# Patient Record
Sex: Female | Born: 1940 | Race: White | Hispanic: No | Smoking: Former smoker
Health system: Southern US, Community
[De-identification: ages and names within clinical notes are randomized; demographics above are authoritative.]

## PROBLEM LIST (undated history)

## (undated) DIAGNOSIS — Z8619 Personal history of other infectious and parasitic diseases: Secondary | ICD-10-CM

## (undated) DIAGNOSIS — R5383 Other fatigue: Secondary | ICD-10-CM

## (undated) DIAGNOSIS — E039 Hypothyroidism, unspecified: Secondary | ICD-10-CM

## (undated) DIAGNOSIS — M1711 Unilateral primary osteoarthritis, right knee: Secondary | ICD-10-CM

## (undated) DIAGNOSIS — C801 Malignant (primary) neoplasm, unspecified: Secondary | ICD-10-CM

## (undated) DIAGNOSIS — K632 Fistula of intestine: Secondary | ICD-10-CM

## (undated) DIAGNOSIS — R5381 Other malaise: Secondary | ICD-10-CM

## (undated) DIAGNOSIS — Z85850 Personal history of malignant neoplasm of thyroid: Secondary | ICD-10-CM

## (undated) DIAGNOSIS — N951 Menopausal and female climacteric states: Secondary | ICD-10-CM

## (undated) DIAGNOSIS — R55 Syncope and collapse: Secondary | ICD-10-CM

## (undated) DIAGNOSIS — K219 Gastro-esophageal reflux disease without esophagitis: Secondary | ICD-10-CM

## (undated) DIAGNOSIS — J309 Allergic rhinitis, unspecified: Secondary | ICD-10-CM

## (undated) DIAGNOSIS — R51 Headache: Secondary | ICD-10-CM

## (undated) DIAGNOSIS — M62830 Muscle spasm of back: Secondary | ICD-10-CM

## (undated) DIAGNOSIS — M810 Age-related osteoporosis without current pathological fracture: Principal | ICD-10-CM

## (undated) DIAGNOSIS — I10 Essential (primary) hypertension: Secondary | ICD-10-CM

## (undated) DIAGNOSIS — Z9109 Other allergy status, other than to drugs and biological substances: Secondary | ICD-10-CM

## (undated) DIAGNOSIS — E042 Nontoxic multinodular goiter: Secondary | ICD-10-CM

## (undated) DIAGNOSIS — R569 Unspecified convulsions: Secondary | ICD-10-CM

## (undated) DIAGNOSIS — Z8739 Personal history of other diseases of the musculoskeletal system and connective tissue: Secondary | ICD-10-CM

## (undated) HISTORY — PX: COLON SURGERY: SHX602

## (undated) HISTORY — DX: Hypothyroidism, unspecified: E03.9

## (undated) HISTORY — DX: Unilateral primary osteoarthritis, right knee: M17.11

## (undated) HISTORY — DX: Personal history of malignant neoplasm of thyroid: Z85.850

## (undated) HISTORY — DX: Other allergy status, other than to drugs and biological substances: Z91.09

## (undated) HISTORY — DX: Essential (primary) hypertension: I10

## (undated) HISTORY — DX: Unspecified convulsions: R56.9

## (undated) HISTORY — DX: Nontoxic multinodular goiter: E04.2

## (undated) HISTORY — DX: Age-related osteoporosis without current pathological fracture: M81.0

## (undated) HISTORY — DX: Other fatigue: R53.83

## (undated) HISTORY — PX: FRACTURE SURGERY: SHX138

## (undated) HISTORY — DX: Allergic rhinitis, unspecified: J30.9

## (undated) HISTORY — DX: Fistula of intestine: K63.2

## (undated) HISTORY — DX: Menopausal and female climacteric states: N95.1

## (undated) HISTORY — DX: Malignant (primary) neoplasm, unspecified: C80.1

## (undated) HISTORY — DX: Other malaise: R53.81

## (undated) HISTORY — DX: Syncope and collapse: R55

## (undated) HISTORY — DX: Personal history of other diseases of the musculoskeletal system and connective tissue: Z87.39

## (undated) HISTORY — PX: OTHER SURGICAL HISTORY: SHX169

---

## 1982-04-02 DIAGNOSIS — C801 Malignant (primary) neoplasm, unspecified: Secondary | ICD-10-CM

## 1982-04-02 HISTORY — DX: Malignant (primary) neoplasm, unspecified: C80.1

## 1983-04-03 HISTORY — PX: OTHER SURGICAL HISTORY: SHX169

## 1999-04-03 HISTORY — PX: OTHER SURGICAL HISTORY: SHX169

## 2000-01-30 ENCOUNTER — Ambulatory Visit (HOSPITAL_BASED_OUTPATIENT_CLINIC_OR_DEPARTMENT_OTHER): Admission: RE | Admit: 2000-01-30 | Discharge: 2000-01-30 | Payer: Self-pay | Admitting: Orthopaedic Surgery

## 2000-04-16 ENCOUNTER — Other Ambulatory Visit: Admission: RE | Admit: 2000-04-16 | Discharge: 2000-04-16 | Payer: Self-pay | Admitting: Family Medicine

## 2001-06-10 ENCOUNTER — Other Ambulatory Visit: Admission: RE | Admit: 2001-06-10 | Discharge: 2001-06-10 | Payer: Self-pay | Admitting: Family Medicine

## 2002-01-18 ENCOUNTER — Emergency Department (HOSPITAL_COMMUNITY): Admission: EM | Admit: 2002-01-18 | Discharge: 2002-01-18 | Payer: Self-pay | Admitting: Emergency Medicine

## 2002-01-18 ENCOUNTER — Encounter: Payer: Self-pay | Admitting: Orthopedic Surgery

## 2002-01-18 ENCOUNTER — Encounter: Payer: Self-pay | Admitting: Emergency Medicine

## 2002-01-28 ENCOUNTER — Ambulatory Visit (HOSPITAL_BASED_OUTPATIENT_CLINIC_OR_DEPARTMENT_OTHER): Admission: RE | Admit: 2002-01-28 | Discharge: 2002-01-28 | Payer: Self-pay | Admitting: Orthopedic Surgery

## 2002-10-06 ENCOUNTER — Other Ambulatory Visit: Admission: RE | Admit: 2002-10-06 | Discharge: 2002-10-06 | Payer: Self-pay | Admitting: Family Medicine

## 2003-12-02 HISTORY — PX: OTHER SURGICAL HISTORY: SHX169

## 2005-02-13 ENCOUNTER — Other Ambulatory Visit: Admission: RE | Admit: 2005-02-13 | Discharge: 2005-02-13 | Payer: Self-pay | Admitting: Family Medicine

## 2006-05-23 ENCOUNTER — Other Ambulatory Visit: Admission: RE | Admit: 2006-05-23 | Discharge: 2006-05-23 | Payer: Self-pay | Admitting: Family Medicine

## 2007-07-09 ENCOUNTER — Encounter: Payer: Self-pay | Admitting: Internal Medicine

## 2008-07-15 ENCOUNTER — Encounter: Payer: Self-pay | Admitting: Internal Medicine

## 2008-07-15 ENCOUNTER — Other Ambulatory Visit: Admission: RE | Admit: 2008-07-15 | Discharge: 2008-07-15 | Payer: Self-pay | Admitting: Family Medicine

## 2008-07-16 LAB — CONVERTED CEMR LAB: Pap Smear: NORMAL

## 2008-07-29 ENCOUNTER — Encounter: Payer: Self-pay | Admitting: Internal Medicine

## 2008-08-09 ENCOUNTER — Encounter: Payer: Self-pay | Admitting: Internal Medicine

## 2009-04-21 ENCOUNTER — Ambulatory Visit: Payer: Self-pay | Admitting: Internal Medicine

## 2009-04-21 ENCOUNTER — Telehealth (INDEPENDENT_AMBULATORY_CARE_PROVIDER_SITE_OTHER): Payer: Self-pay | Admitting: *Deleted

## 2009-04-21 DIAGNOSIS — R5383 Other fatigue: Secondary | ICD-10-CM

## 2009-04-21 DIAGNOSIS — E039 Hypothyroidism, unspecified: Secondary | ICD-10-CM

## 2009-04-21 DIAGNOSIS — J309 Allergic rhinitis, unspecified: Secondary | ICD-10-CM

## 2009-04-21 DIAGNOSIS — R569 Unspecified convulsions: Secondary | ICD-10-CM

## 2009-04-21 DIAGNOSIS — R5381 Other malaise: Secondary | ICD-10-CM

## 2009-04-21 DIAGNOSIS — I10 Essential (primary) hypertension: Secondary | ICD-10-CM

## 2009-04-21 DIAGNOSIS — E785 Hyperlipidemia, unspecified: Secondary | ICD-10-CM

## 2009-04-21 DIAGNOSIS — Z85850 Personal history of malignant neoplasm of thyroid: Secondary | ICD-10-CM

## 2009-04-21 HISTORY — DX: Personal history of malignant neoplasm of thyroid: Z85.850

## 2009-04-21 HISTORY — DX: Hypothyroidism, unspecified: E03.9

## 2009-04-21 HISTORY — DX: Allergic rhinitis, unspecified: J30.9

## 2009-04-21 HISTORY — DX: Essential (primary) hypertension: I10

## 2009-04-21 HISTORY — DX: Other malaise: R53.81

## 2009-04-22 ENCOUNTER — Ambulatory Visit: Payer: Self-pay | Admitting: Internal Medicine

## 2009-04-22 ENCOUNTER — Encounter (INDEPENDENT_AMBULATORY_CARE_PROVIDER_SITE_OTHER): Payer: Self-pay | Admitting: *Deleted

## 2009-04-22 LAB — CONVERTED CEMR LAB
Albumin: 4 g/dL (ref 3.5–5.2)
Alkaline Phosphatase: 104 units/L (ref 39–117)
Basophils Absolute: 0 10*3/uL (ref 0.0–0.1)
CO2: 31 meq/L (ref 19–32)
Chloride: 102 meq/L (ref 96–112)
Cholesterol: 193 mg/dL (ref 0–200)
Creatinine, Ser: 0.8 mg/dL (ref 0.4–1.2)
Eosinophils Relative: 1.4 % (ref 0.0–5.0)
HDL: 79.5 mg/dL (ref >39.00)
Hemoglobin: 13.8 g/dL (ref 12.0–15.0)
LDL Cholesterol: 106 mg/dL — ABNORMAL HIGH (ref 0–99)
Lymphocytes Relative: 19.7 % (ref 12.0–46.0)
Monocytes Relative: 8.1 % (ref 3.0–12.0)
Neutro Abs: 3.4 10*3/uL (ref 1.4–7.7)
Platelets: 240 10*3/uL (ref 150.0–400.0)
Potassium: 3.9 meq/L (ref 3.5–5.1)
RDW: 12.5 % (ref 11.5–14.6)
Sodium: 139 meq/L (ref 135–145)
Specific Gravity, Urine: 1.01 (ref 1.000–1.030)
TSH: 0.85 microintl units/mL (ref 0.35–5.50)
Total CHOL/HDL Ratio: 2
Total Protein: 6.4 g/dL (ref 6.0–8.3)
Triglycerides: 36 mg/dL (ref 0.0–149.0)
Urobilinogen, UA: 0.2 (ref 0.0–1.0)
WBC: 4.8 10*3/uL (ref 4.5–10.5)
pH: 7.5 (ref 5.0–8.0)

## 2009-04-27 ENCOUNTER — Encounter: Admission: RE | Admit: 2009-04-27 | Discharge: 2009-04-27 | Payer: Self-pay | Admitting: Internal Medicine

## 2009-05-04 ENCOUNTER — Ambulatory Visit: Payer: Self-pay | Admitting: Endocrinology

## 2009-05-04 DIAGNOSIS — E042 Nontoxic multinodular goiter: Secondary | ICD-10-CM

## 2009-05-04 HISTORY — DX: Nontoxic multinodular goiter: E04.2

## 2009-05-10 ENCOUNTER — Encounter (INDEPENDENT_AMBULATORY_CARE_PROVIDER_SITE_OTHER): Payer: Self-pay | Admitting: *Deleted

## 2009-05-11 ENCOUNTER — Ambulatory Visit: Payer: Self-pay | Admitting: Internal Medicine

## 2009-05-26 ENCOUNTER — Ambulatory Visit: Payer: Self-pay | Admitting: Internal Medicine

## 2009-06-01 ENCOUNTER — Ambulatory Visit: Payer: Self-pay | Admitting: Internal Medicine

## 2009-08-12 ENCOUNTER — Encounter: Payer: Self-pay | Admitting: Internal Medicine

## 2009-08-12 LAB — HM MAMMOGRAPHY: HM Mammogram: NORMAL

## 2009-10-27 ENCOUNTER — Telehealth: Payer: Self-pay | Admitting: Internal Medicine

## 2009-12-20 ENCOUNTER — Telehealth: Payer: Self-pay | Admitting: Internal Medicine

## 2009-12-25 ENCOUNTER — Ambulatory Visit: Payer: Self-pay | Admitting: Cardiology

## 2009-12-25 ENCOUNTER — Inpatient Hospital Stay (HOSPITAL_COMMUNITY): Admission: EM | Admit: 2009-12-25 | Discharge: 2009-12-27 | Payer: Self-pay | Admitting: Emergency Medicine

## 2009-12-27 ENCOUNTER — Encounter (INDEPENDENT_AMBULATORY_CARE_PROVIDER_SITE_OTHER): Payer: Self-pay | Admitting: Internal Medicine

## 2010-01-02 ENCOUNTER — Ambulatory Visit: Payer: Self-pay | Admitting: Internal Medicine

## 2010-01-02 DIAGNOSIS — R61 Generalized hyperhidrosis: Secondary | ICD-10-CM | POA: Insufficient documentation

## 2010-01-02 DIAGNOSIS — R55 Syncope and collapse: Secondary | ICD-10-CM

## 2010-01-02 DIAGNOSIS — N390 Urinary tract infection, site not specified: Secondary | ICD-10-CM

## 2010-01-02 HISTORY — DX: Syncope and collapse: R55

## 2010-01-05 ENCOUNTER — Encounter: Payer: Self-pay | Admitting: Internal Medicine

## 2010-01-05 DIAGNOSIS — R799 Abnormal finding of blood chemistry, unspecified: Secondary | ICD-10-CM

## 2010-01-06 LAB — CONVERTED CEMR LAB
Beta Globulin: 4.7 % (ref 4.7–7.2)
Gamma Globulin: 6.9 % — ABNORMAL LOW (ref 11.1–18.8)

## 2010-01-09 ENCOUNTER — Encounter: Payer: Self-pay | Admitting: Internal Medicine

## 2010-01-09 ENCOUNTER — Ambulatory Visit: Payer: Self-pay | Admitting: Internal Medicine

## 2010-01-09 LAB — CONVERTED CEMR LAB
IgG (Immunoglobin G), Serum: 509 mg/dL — ABNORMAL LOW (ref 694–1618)
Total Protein, Serum Electrophoresis: 6.3 g/dL (ref 6.0–8.3)

## 2010-01-13 ENCOUNTER — Telehealth: Payer: Self-pay | Admitting: Internal Medicine

## 2010-01-17 ENCOUNTER — Encounter: Payer: Self-pay | Admitting: Internal Medicine

## 2010-01-18 ENCOUNTER — Ambulatory Visit: Payer: Self-pay

## 2010-01-18 ENCOUNTER — Encounter: Payer: Self-pay | Admitting: Internal Medicine

## 2010-01-24 ENCOUNTER — Ambulatory Visit: Payer: Self-pay | Admitting: Internal Medicine

## 2010-05-02 NOTE — Miscellaneous (Signed)
Summary: Orders Update  Clinical Lists Changes  Orders: Added new Test order of Carotid Duplex (Carotid Duplex) - Signed 

## 2010-05-02 NOTE — Letter (Signed)
Summary: Previsit letter  [facility]  [address] [place], [place] [address]   Phone: [phone]  Fax: [phone]       04/22/2009 MRN: [id number]  Tammy Duke [address] [place], [place]  [address]  Dear [name],  Welcome to the Gastroenterology Division at [facility].    You are scheduled to see a nurse for your pre-procedure visit on 05-11-09 at 10:00a.m. on the 3rd floor at [facility], [address]  We ask that you try to arrive at our office 15 minutes prior to your appointment time to allow for check-in.  Your nurse visit will consist of discussing your medical and surgical history, your immediate family medical history, and your medications.    Please bring a complete list of all your medications or, if you prefer, bring the medication bottles and we will list them.  We will need to be aware of both prescribed and over the counter drugs.  We will need to know exact dosage information as well.  If you are on blood thinners (Coumadin, Plavix, Aggrenox, Ticlid, etc.) please call our office today/prior to your appointment, as we need to consult with your physician about holding your medication.   Please be prepared to read and sign documents such as consent forms, a financial agreement, and acknowledgement forms.  If necessary, and with your consent, a friend or relative is welcome to sit-in on the nurse visit with you.  Please bring your insurance card so that we may make a copy of it.  If your insurance requires a referral to see a specialist, please bring your referral form from your primary care physician.  No co-pay is required for this nurse visit.     If you cannot keep your appointment, please call [phone] to cancel or reschedule prior to your appointment date.  This allows [place] the opportunity to schedule an appointment for another patient in need of care.    Thank you for choosing [facility] for your medical  needs.  We appreciate the opportunity to care for you.  Please visit [place] at our website  to learn more about our practice.                     Sincerely.                                                                                                                   The Gastroenterology Division

## 2010-05-02 NOTE — Procedures (Signed)
Summary: Colonoscopy  Patient: [name] Note: All result statuses are Final unless otherwise noted.  Tests: (1) Colonoscopy (COL)   COL Colonoscopy           DONE     [facility]     [address]     [place], [place]  [address]           COLONOSCOPY PROCEDURE REPORT           PATIENT:  Tammy, Duke  MR#:  [id number]     BIRTHDATE:  Nov 20, 1940, 68 yrs. old  GENDER:  female           ENDOSCOPIST:  Hedwig Morton. Juanda Chance, MD     Referred by:  Oliver Barre, M.D.           PROCEDURE DATE:  05/26/2009     PROCEDURE:  Colonoscopy [address]     ASA CLASS:  Class II     INDICATIONS:  Routine Risk Screening           MEDICATIONS:   Versed 7 mg, Fentanyl 50 mcg           DESCRIPTION OF PROCEDURE:   After the risks benefits and     alternatives of the procedure were thoroughly explained, informed     consent was obtained.  Digital rectal exam was performed and     revealed no rectal masses.   The LB [id number] endoscope     was introduced through the anus and advanced to the cecum, which     was identified by both the appendix and ileocecal valve, without     limitations.  The quality of the prep was good, using MiraLax.     The instrument was then slowly withdrawn as the colon was fully     examined.     <<PROCEDUREIMAGES>>           FINDINGS:  Severe diverticulosis was found in the sigmoid colon     (see image1, image3, image7, and image8). marked diverticulosis,     narrow tortuous lumen, thick haustral folds, deep diverticuli,     difficult to traverse due to spasm  This was otherwise a normal     examination of the colon (see image9, image6, image5, and image4).     Retroflexed views in the rectum revealed no abnormalities.    The     scope was then withdrawn from the patient and the procedure     completed.           COMPLICATIONS:  None           ENDOSCOPIC IMPRESSION:     1) Severe diverticulosis in the sigmoid colon     2) Otherwise normal examination     RECOMMENDATIONS:     1) High fiber diet.     Metamucil 1 tablespoon daily           REPEAT EXAM:  In 10 year(s) for.           ______________________________     Hedwig Morton. Juanda Chance, MD           CC:           n.     eSIGNED:   Hedwig Morton. Ileah Falkenstein at 05/26/2009 11:15 AM           Marlana Latus, [phone]  Note: An exclamation mark (!) indicates a result that was not dispersed into the flowsheet. Document Creation Date: 05/26/2009 11:16 AM _______________________________________________________________________  (  1) Order result status: Final Collection or observation date-time: 05/26/2009 11:08 Requested date-time:  Receipt date-time:  Reported date-time:  Referring Physician:   Ordering Physician: Lina Sar [phone]) Specimen Source:  Source: Launa Grill Order Number: [id number] Lab site:   Appended Document: Colonoscopy    Clinical Lists Changes  Observations: Added new observation of COLONNXTDUE: 05/2019 (05/26/2009 11:21)

## 2010-05-02 NOTE — Progress Notes (Signed)
Summary: Rx refill req  Phone Note Refill Request Message from:  Patient on January 13, 2010 10:44 AM  Refills Requested: Medication #1:  CYCLOBENZAPRINE HCL 5 MG TABS 1 by mouth 3 times a day as needed   Dosage confirmed as above?Dosage Confirmed   Supply Requested: 3 months  Method Requested: Electronic Initial call taken by: [place] Pyle, CMA,  January 13, 2010 10:44 AM    Prescriptions: CYCLOBENZAPRINE HCL 5 MG TABS (CYCLOBENZAPRINE HCL) 1 by mouth 3 times a day as needed  #90 Each x 2   Entered by:   [place] Pyle, CMA   Authorized by:   Corwin Levins MD   Signed by:   [place] Pyle, CMA on 01/13/2010   Method used:   Electronically to        [facility] [phone]* (retail)       [address]       [place], [place]  [address]       Ph: [phone]       Fax: [phone]   RxID:   [id number]

## 2010-05-02 NOTE — Miscellaneous (Signed)
Summary: Orders Update   Clinical Lists Changes  Problems: Added new problem of OTHER NONSPECIFIC FINDINGS EXAMINATION OF BLOOD (ICD-790.99) Orders: Added new Test order of T-Immunofixation Electrophoresis, Serum  ([id number]) - Signed 

## 2010-05-02 NOTE — Progress Notes (Signed)
  Phone Note Call from Patient Call back at Home Phone [phone]   Caller: Patient Call For: Corwin Levins MD Summary of Call: Pt left msg on vm md ok carbamzeprine but he change to extended release. Pt is wanting to know why before she start taking med. Pls advise Initial call taken by: Orlan Leavens RMA,  December 20, 2009 4:35 PM  Follow-up for Phone Call        sorry , this was an error;  it is ok to take if she already has it, but I re-sent the original "instant" carbamazepine to the pharmacy Follow-up by: Corwin Levins MD,  December 20, 2009 5:32 PM  Additional Follow-up for Phone Call Additional follow up Details #1::        Pt informed Additional Follow-up by: [place] Pyle, CMA,  December 21, 2009 8:27 AM    New/Updated Medications: CARBAMAZEPINE 200 MG TABS (CARBAMAZEPINE) 5 by mouth once daily Prescriptions: CARBAMAZEPINE 200 MG TABS (CARBAMAZEPINE) 5 by mouth once daily  #150 x 11   Entered and Authorized by:   Corwin Levins MD   Signed by:   Corwin Levins MD on 12/20/2009   Method used:   Electronically to        [facility] [phone]* (retail)       [address]       [place], [place]  [address]       Ph: [phone]       Fax: [phone]   RxID:   [id number]

## 2010-05-02 NOTE — Assessment & Plan Note (Signed)
Summary: post ER /#/cd   Vital Signs:  Patient profile:   70 year old female Height:      70 inches Weight:      180.38 pounds BMI:     25.98 O2 Sat:      96 % on Room air Temp:     98.5 degrees F oral Pulse rate:   101 / minute BP sitting:   142 / 82  (left arm) Cuff size:   regular  Vitals Entered By: Zella Ball Ewing CMA Duncan Dull) (January 02, 2010 9:06 AM)  O2 Flow:  Room air  Preventive Care Screening  Mammogram:    Date:  08/12/2009    Results:  normal   CC: Post ER/RE   Primary Care Provider:  Corwin Levins MD  CC:  Post ER/RE.  History of Present Illness: Here to f/u - s/p recent obs eval at [facility] after episode of lighthededness and sliding off the chair to the floor with syncope;  no injury and no sz activity noted;  w/u in the hosp included monitoring, MI r/o'd, routine lab eval essentially neg except for mild anemia with hgb 11.6 (no overt bleed or bruise - has small bruises today after ASA and lovenox while hospd);  CT head neg for acute, 2d echo unrevealing, EEG neg;  also tx for  3 days while hospd only for ? UTI, pt currently denies further problems with GU such as dysuria, freq, urgency or hematuria.  Pt denies CP, worsening sob, doe, wheezing, orthopnea, pnd, worsening LE edema, palps, dizziness or further syncope.  Pt denies new neuro symptoms such as headache, facial or extremity weakness  Pt denies polydipsia, polyuria  Overall good compliance with meds, trying to follow low chol  diet, wt stable, little excercise however  Also on d/c summary mentions losartan at 25 mg;  also incidently today pt mentions a concern of her also of what she feels is profuse sweats at work during the summer (much less signficinat in the fall) but still perstent.    Problems Prior to Update: 1)  Goiter, Multinodular  (ICD-241.1) 2)  Preventive Health Care  (ICD-V70.0) 3)  Fatigue  (ICD-780.79) 4)  Thyroid Cancer, Hx of  (ICD-V10.87) 5)  Preventive Health Care   (ICD-V70.0) 6)  Seizure Disorder  (ICD-780.39) 7)  Hypertension  (ICD-401.9) 8)  Hyperlipidemia  (ICD-272.4) 9)  Allergic Rhinitis  (ICD-477.9) 10)  Hypothyroidism  (ICD-244.9)  Medications Prior to Update: 1)  Carbamazepine 200 Mg Tabs (Carbamazepine) .... 5 By Mouth Once Daily 2)  Meloxicam 15 Mg Tabs (Meloxicam) .Marland Kitchen.. 1 By Mouth Once Daily 3)  Cyclobenzaprine Hcl 5 Mg Tabs (Cyclobenzaprine Hcl) .Marland Kitchen.. 1 By Mouth 3 Times A Day As Needed 4)  Vitamin C 500 Mg Tabs (Ascorbic Acid) .... 1/2 By Mouth Once Daily 5)  Magnesium 250 Mg Tabs (Magnesium) .Marland Kitchen.. 1 By Mouth Once Daily 6)  Caltrate 600+d 600-400 Mg-Unit Tabs (Calcium Carbonate-Vitamin D) .Marland Kitchen.. 1 By Mouth Two Times A Day 7)  Calcium-Vitamin D 600-200 Mg-Unit Tabs (Calcium-Vitamin D) .Marland Kitchen.. 1 By Mouth Once Daily 8)  Losartan Potassium 50 Mg Tabs (Losartan Potassium) .Marland Kitchen.. 1po Once Daily 9)  Aspir-Low 81 Mg Tbec (Aspirin) .Marland Kitchen.. 1 By Mouth Once Daily 10)  Levothyroxine Sodium 125 Mcg Tabs (Levothyroxine Sodium) .Marland Kitchen.. 1 Qd  Current Medications (verified): 1)  Carbamazepine 200 Mg Tabs (Carbamazepine) .Marland Kitchen.. 1 and 1/2 Po Bid, and 1 By Mouth At Lunch and Dinner in Addition To Other Tegretol Dosing 2)  Meloxicam 15 Mg Tabs (Meloxicam) .Marland Kitchen.. 1 By Mouth Once Daily 3)  Cyclobenzaprine Hcl 5 Mg Tabs (Cyclobenzaprine Hcl) .Marland Kitchen.. 1 By Mouth 3 Times A Day As Needed 4)  Vitamin C 500 Mg Tabs (Ascorbic Acid) .... 1/2 By Mouth Once Daily 5)  Magnesium 250 Mg Tabs (Magnesium) .Marland Kitchen.. 1 By Mouth Once Daily 6)  Caltrate 600+d 600-400 Mg-Unit Tabs (Calcium Carbonate-Vitamin D) .Marland Kitchen.. 1 By Mouth Two Times A Day 7)  Calcium-Vitamin D 600-200 Mg-Unit Tabs (Calcium-Vitamin D) .Marland Kitchen.. 1 By Mouth Once Daily 8)  Losartan Potassium 50 Mg Tabs (Losartan Potassium) .Marland Kitchen.. 1po Once Daily 9)  Aspir-Low 81 Mg Tbec (Aspirin) .Marland Kitchen.. 1 By Mouth Once Daily 10)  Levothyroxine Sodium 125 Mcg Tabs (Levothyroxine Sodium) .Marland Kitchen.. 1 Qd  Allergies (verified): 1)  ! 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  , not ill appearing Head:  normocephalic and atraumatic.   Eyes:  vision grossly intact, pupils equal, and pupils round.   Ears:  R ear normal and L ear normal.   Nose:  no external deformity and no nasal discharge.   Mouth:  no gingival abnormalities and pharynx pink and moist.   Neck:  supple and no masses.   Lungs:  normal respiratory effort and normal breath sounds.   Heart:  normal rate and regular rhythm.   Abdomen:  soft, non-tender, and normal bowel sounds.   Msk:  no flank tender, no spine tender Extremities:  no edema, no erythema  Neurologic:  cranial nerves II-XII intact, strength normal in all extremities, and gait normal.   Skin:  color normal and no rashes.   Psych:  not depressed appearing and moderately anxious with pressure speech today   Impression & Recommendations:  Problem # 1:  SYNCOPE (ICD-780.2)  ? vasovagal vs other - etiology not clear, w/u neg to date; also for carotid dopplers, o/w follow  Orders: Radiology Referral (Radiology)  Problem # 2:  UTI (ICD-599.0) tx while hospd;  asympt now, declines further testing at this time  Problem # 3:  HYPERTENSION  (ICD-401.9)  Her updated medication list for this problem includes:    Losartan Potassium 50 Mg Tabs (Losartan potassium) .Marland Kitchen... 1po once daily  BP today: 142/82 Prior BP: 128/88 (06/01/2009)  Labs Reviewed: K+: 3.9 (04/22/2009) Creat: : 0.8 (04/22/2009)   Chol: 193 (04/22/2009)   HDL: 79.50 (04/22/2009)   LDL: 106 (04/22/2009)   TG: 36.0 (04/22/2009) OK to resume the 50 mg losartan - treat as above, f/u any worsening signs or symptoms   Problem # 4:  SWEATING (ICD-780.8)  not related to above, but a signficant concern of the pt todya - for SPEP  Orders: T-Serum Protein Electrophoresis ([id number])  Complete Medication List: 1)  Carbamazepine 200 Mg Tabs (Carbamazepine) .Marland KitchenMarland KitchenMarland Kitchen  1 and 1/2 po bid, and 1 by mouth at lunch and dinner in addition to other tegretol dosing 2)  Meloxicam 15 Mg Tabs (Meloxicam) .Marland Kitchen.. 1 by mouth once daily 3)  Cyclobenzaprine Hcl 5 Mg Tabs (Cyclobenzaprine hcl) .Marland Kitchen.. 1 by mouth 3 times a day as needed 4)  Vitamin C 500 Mg Tabs (Ascorbic acid) .... 1/2 by mouth once daily 5)  Magnesium 250 Mg Tabs (Magnesium) .Marland Kitchen.. 1 by mouth once daily 6)  Caltrate 600+d 600-400 Mg-unit Tabs (Calcium carbonate-vitamin d) .Marland Kitchen.. 1 by mouth two times a day 7)  Calcium-vitamin D 600-200 Mg-unit Tabs (Calcium-vitamin d) .Marland Kitchen.. 1 by mouth once daily 8)  Losartan Potassium 50 Mg Tabs (Losartan potassium) .Marland Kitchen.. 1po once daily 9)  Aspir-low 81 Mg Tbec (Aspirin) .Marland Kitchen.. 1 by mouth once daily 10)  Levothyroxine Sodium 125 Mcg Tabs (Levothyroxine sodium) .Marland Kitchen.. 1 qd  Patient Instructions: 1)  Please go to the Lab in the basement for your blood tests today 2)  You will be contacted about the referral(s) to: carotid doppler 3)  Please call the number on the [facility] Card for results of your testing  4)  Continue all previous medications as before this visit, including the losartan at 50 mg per day 5)  Please schedule a follow-up appointment in 3 months = Jan 2012 for your "yearly medicare exam"

## 2010-05-02 NOTE — Letter (Signed)
Summary: [facility] Instructions  [facility]  [address] [place], [place] [address]   Phone: [phone]  Fax: [phone]       Tammy Duke    [birth year]    MRN: [id number]       Procedure Day /Date:  05/26/09  Thursday     Arrival Time:  10:00am     Procedure Time: 11:00am     Location of Procedure:                    _ x_  [facility] (4th Floor)    PREPARATION FOR COLONOSCOPY WITH MIRALAX  Starting 5 days prior to your procedure _2/19/11 _ do not eat nuts, seeds, popcorn, corn, beans, peas,  salads, or any raw vegetables.  Do not take any fiber supplements (e.g. Metamucil, Citrucel, and Benefiber). ____________________________________________________________________________________________________   THE DAY BEFORE YOUR PROCEDURE         DATE: 05/25/09 DAY:  Wednesday  1   Drink clear liquids the entire day-NO SOLID FOOD  2   Do not drink anything colored red or purple.  Avoid juices with pulp.  No orange juice.  3   Drink at least 64 oz. (8 glasses) of fluid/clear liquids during the day to prevent dehydration and help the prep work efficiently.  CLEAR LIQUIDS INCLUDE: Water Jello Ice Popsicles Tea (sugar ok, no milk/cream) Powdered fruit flavored drinks Coffee (sugar ok, no milk/cream) Gatorade Juice: apple, white grape, white cranberry  Lemonade Clear bullion, consomm, broth Carbonated beverages (any kind) Strained chicken noodle soup Hard Candy  4   Mix the entire bottle of Miralax with 64 oz. of Gatorade/Powerade in the morning and put in the refrigerator to chill.  5   At 3:00 pm take 2 Dulcolax/Bisacodyl tablets.  6   At 4:30 pm take one Reglan/Metoclopramide tablet.  7  Starting at 5:00 pm drink one 8 oz glass of the Miralax mixture every 15-20 minutes until you have finished drinking the entire 64 oz.  You should finish drinking prep around 7:30 or 8:00 pm.  8   If you are nauseated, you may take the 2nd  Reglan/Metoclopramide tablet at 6:30 pm.        9    At 8:00 pm take 2 more DULCOLAX/Bisacodyl tablets.     THE DAY OF YOUR PROCEDURE      DATE:   05/26/09 DAY:  Thursday  You may drink clear liquids until  9:00am  (2 HOURS BEFORE PROCEDURE).   MEDICATION INSTRUCTIONS  Unless otherwise instructed, you should take regular prescription medications with a small sip of water as early as possible the morning of your procedure.    Additional medication instructions:  Be sure to take your regular prescription medicine the morning of procedure.         OTHER INSTRUCTIONS  You will need a responsible adult at least 69 years of age to accompany you and drive you home.   This person must remain in the waiting room during your procedure.  Wear loose fitting clothing that is easily removed.  Leave jewelry and other valuables at home.  However, you may wish to bring a book to read or an iPod/MP3 player to listen to music as you wait for your procedure to start.  Remove all body piercing jewelry and leave at home.  Total time from sign-in until discharge is approximately 2-3 hours.  You should go home directly after your procedure and rest.  You  can resume normal activities the day after your procedure.  The day of your procedure you should not:   Drive   Make legal decisions   Operate machinery   Drink alcohol   Return to work  You will receive specific instructions about eating, activities and medications before you leave.   The above instructions have been reviewed and explained to me by   Wyona Almas RN  May 11, 2009 10:26 AM     I fully understand and can verbalize these instructions _____________________________ Date _______

## 2010-05-02 NOTE — Miscellaneous (Signed)
Summary: [facility] Previsit/prep  Clinical Lists Changes  Medications: Added new medication of MIRALAX   POWD (POLYETHYLENE GLYCOL 3350) As per prep  instructions. - Signed Added new medication of METOCLOPRAMIDE HCL 10 MG  TABS (METOCLOPRAMIDE HCL) As per prep instructions. - Signed Added new medication of DULCOLAX 5 MG  TBEC (BISACODYL) Day before procedure take 2 at 3pm and 2 at 8pm. - Signed Rx of MIRALAX   POWD (POLYETHYLENE GLYCOL 3350) As per prep  instructions.;  #255gm x 0;  Signed;  Entered by: Wyona Almas RN;  Authorized by: Hart Carwin MD;  Method used: Electronically to [facility] [phone]*, [address], [place], [place], [place]  [address], Ph: [phone], Fax: [phone] Rx of METOCLOPRAMIDE HCL 10 MG  TABS (METOCLOPRAMIDE HCL) As per prep instructions.;  #2 x 0;  Signed;  Entered by: Wyona Almas RN;  Authorized by: Hart Carwin MD;  Method used: Electronically to [facility] [phone]*, [address], [place], [place], [place]  [address], Ph: [phone], Fax: [phone] Rx of DULCOLAX 5 MG  TBEC (BISACODYL) Day before procedure take 2 at 3pm and 2 at 8pm.;  #4 x 0;  Signed;  Entered by: Wyona Almas RN;  Authorized by: Hart Carwin MD;  Method used: Electronically to [facility] [phone]*, [address] [place], [place], [place]  [address], Ph: [phone], Fax: [phone] Observations: Added new observation of ALLERGY REV: Done (05/11/2009 9:24)    Prescriptions: DULCOLAX 5 MG  TBEC (BISACODYL) Day before procedure take 2 at 3pm and 2 at 8pm.  #4 x 0   Entered by:   Wyona Almas RN   Authorized by:   Hart Carwin MD   Signed by:   Wyona Almas RN on 05/11/2009   Method used:   Electronically to        [facility] [phone]* (retail)       [address]       [place], [place]  [address]       Ph: [phone]       Fax: [phone]   RxID:    [id number] METOCLOPRAMIDE HCL 10 MG  TABS (METOCLOPRAMIDE HCL) As per prep instructions.  #2 x 0   Entered by:   Wyona Almas RN   Authorized by:   Hart Carwin MD   Signed by:   Wyona Almas RN on 05/11/2009   Method used:   Electronically to        [facility] [phone]* (retail)       [address]       [place], [place]  [address]       Ph: [phone]       Fax: [phone]   RxID:   [id number] MIRALAX   POWD (POLYETHYLENE GLYCOL 3350) As per prep  instructions.  #255gm x 0   Entered by:   Wyona Almas RN   Authorized by:   Hart Carwin MD   Signed by:   Wyona Almas RN on 05/11/2009   Method used:   Electronically to        [facility] [phone]* (retail)       [address]       [place], [place]  [address]       Ph:  [phone]       Fax: [phone]   RxID:   [id number]

## 2010-05-02 NOTE — Progress Notes (Signed)
  Phone Note Refill Request Message from:  Fax from Pharmacy on October 27, 2009 3:27 PM  Refills Requested: Medication #1:  MELOXICAM 15 MG TABS 1 by mouth once daily   Dosage confirmed as above?Dosage Confirmed   Notes: [employer] Initial call taken by: Zella Ball Ewing CMA Duncan Dull),  October 27, 2009 3:27 PM    Prescriptions: MELOXICAM 15 MG TABS (MELOXICAM) 1 by mouth once daily  #30 x 4   Entered by:   Zella Ball Ewing CMA (AAMA)   Authorized by:   Corwin Levins MD   Signed by:   Scharlene Gloss CMA (AAMA) on 10/27/2009   Method used:   Faxed to ...       [facility] [phone]* (retail)       [address]       [place], [place]  [address]       Ph: [phone]       Fax: [phone]   RxID:   [id number]

## 2010-05-02 NOTE — Assessment & Plan Note (Signed)
Summary: 1 month follow up-lb    Vital Signs:  Patient profile:   70 year old female Height:      69 inches Weight:      182 pounds BMI:     26.97 O2 Sat:      97 % on Room air Temp:     97.8 degrees F oral Pulse rate:   93 / minute BP sitting:   128 / 88  (left arm) Cuff size:   regular  Vitals Entered ByMarland Kitchen Zella Ball Ewing (June 01, 2009 8:59 AM)  O2 Flow:  Room air  CC: 1 month followup/RE   Primary Care Provider:  Corwin Levins MD  CC:  1 month followup/RE.  History of Present Illness: overall doing well, no complaints,  good med tolerance, good compliance,  Pt denies CP, sob, doe, wheezing, orthopnea, pnd, worsening LE edema, palps, dizziness or syncope   Pt denies new neuro symptoms such as headache, facial or extremity weakness   No siezure activity.  Denies other hyper or hypo thyroid symtpoms such as wt change, voice change, skin change.  Trying to follow lower chol diet.    Problems Prior to Update: 1)  Goiter, Multinodular  (ICD-241.1) 2)  Preventive Health Care  (ICD-V70.0) 3)  Fatigue  (ICD-780.79) 4)  Thyroid Cancer, Hx of  (ICD-V10.87) 5)  Preventive Health Care  (ICD-V70.0) 6)  Seizure Disorder  (ICD-780.39) 7)  Hypertension  (ICD-401.9) 8)  Hyperlipidemia  (ICD-272.4) 9)  Allergic Rhinitis  (ICD-477.9) 10)  Hypothyroidism  (ICD-244.9)  Medications Prior to Update: 1)  Carbamazepine 200 Mg Xr12h-Tab (Carbamazepine) .... 5 By Mouth Once Daily 2)  Meloxicam 15 Mg Tabs (Meloxicam) .Marland Kitchen.. 1 By Mouth Once Daily 3)  Cyclobenzaprine Hcl 5 Mg Tabs (Cyclobenzaprine Hcl) .Marland Kitchen.. 1 By Mouth 3 Times A Day As Needed 4)  Vitamin C 500 Mg Tabs (Ascorbic Acid) .... 1/2 By Mouth Once Daily 5)  Magnesium 250 Mg Tabs (Magnesium) .Marland Kitchen.. 1 By Mouth Once Daily 6)  Caltrate 600+d 600-400 Mg-Unit Tabs (Calcium Carbonate-Vitamin D) .Marland Kitchen.. 1 By Mouth Two Times A Day 7)  Calcium-Vitamin D 600-200 Mg-Unit Tabs (Calcium-Vitamin D) .Marland Kitchen.. 1 By Mouth Once Daily 8)  Losartan Potassium 50 Mg Tabs  (Losartan Potassium) .Marland Kitchen.. 1po Once Daily 9)  Aspir-Low 81 Mg Tbec (Aspirin) .Marland Kitchen.. 1 By Mouth Once Daily 10)  Levothyroxine Sodium 125 Mcg Tabs (Levothyroxine Sodium) .Marland Kitchen.. 1 Qd 11)  Miralax   Powd (Polyethylene Glycol 3350) .... As Per Prep  Instructions. 12)  Metoclopramide Hcl 10 Mg  Tabs (Metoclopramide Hcl) .... As Per Prep Instructions. 13)  Dulcolax 5 Mg  Tbec (Bisacodyl) .... Day Before Procedure Take 2 At 3pm and 2 At 8pm.  Current Medications (verified): 1)  Carbamazepine 200 Mg Xr12h-Tab (Carbamazepine) .... 5 By Mouth Once Daily 2)  Meloxicam 15 Mg Tabs (Meloxicam) .Marland Kitchen.. 1 By Mouth Once Daily 3)  Cyclobenzaprine Hcl 5 Mg Tabs (Cyclobenzaprine Hcl) .Marland Kitchen.. 1 By Mouth 3 Times A Day As Needed 4)  Vitamin C 500 Mg Tabs (Ascorbic Acid) .... 1/2 By Mouth Once Daily 5)  Magnesium 250 Mg Tabs (Magnesium) .Marland Kitchen.. 1 By Mouth Once Daily 6)  Caltrate 600+d 600-400 Mg-Unit Tabs (Calcium Carbonate-Vitamin D) .Marland Kitchen.. 1 By Mouth Two Times A Day 7)  Calcium-Vitamin D 600-200 Mg-Unit Tabs (Calcium-Vitamin D) .Marland Kitchen.. 1 By Mouth Once Daily 8)  Losartan Potassium 50 Mg Tabs (Losartan Potassium) .Marland Kitchen.. 1po Once Daily 9)  Aspir-Low 81 Mg Tbec (Aspirin) .Marland Kitchen.. 1 By Mouth Once Daily  10)  Levothyroxine Sodium 125 Mcg Tabs (Levothyroxine Sodium) .Marland Kitchen.. 1 Qd 11)  Miralax   Powd (Polyethylene Glycol 3350) .... As Per Prep  Instructions. 12)  Metoclopramide Hcl 10 Mg  Tabs (Metoclopramide Hcl) .... As Per Prep Instructions. 13)  Dulcolax 5 Mg  Tbec (Bisacodyl) .... Day Before Procedure Take 2 At 3pm and 2 At 8pm.  Allergies (verified): 1)  ! 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   Head:  normocephalic and atraumatic.   Eyes:  vision grossly intact, pupils equal, and pupils round.   Ears:  R ear normal and L ear normal.   Nose:  no external deformity and no nasal discharge.   Mouth:  no gingival abnormalities and pharynx pink and moist.   Neck:  supple and no masses.   Lungs:  normal respiratory effort and normal breath sounds.   Heart:  normal rate and regular rhythm.   Extremities:  no edema, no erythema    Impression & Recommendations:  Problem # 1:  HYPERTENSION (ICD-401.9)  Her updated medication list for this problem includes:    Losartan Potassium 50 Mg Tabs (Losartan potassium) .Marland Kitchen... 1po once daily  BP today: 128/88 Prior BP: 158/92 (05/04/2009)  Labs Reviewed: K+: 3.9 (04/22/2009) Creat: : 0.8 (04/22/2009)   Chol: 193 (04/22/2009)   HDL: 79.50 (04/22/2009)   LDL: 106 (04/22/2009)   TG: 36.0 (04/22/2009) improved, Continue all previous medications as before this visit   Problem # 2:  HYPERLIPIDEMIA (ICD-272.4)  Labs Reviewed: SGOT: 24 (04/22/2009)   SGPT: 26 (04/22/2009)   HDL:79.50 (04/22/2009)  LDL:106 (04/22/2009)  Chol:193 (04/22/2009)  Trig:36.0 (04/22/2009)  d/w pt , Pt to continue diet efforts, labs reviewed - goal LDL less than 100  Problem # 3:  HYPOTHYROIDISM (ICD-244.9)  Her updated medication list for this problem includes:    Levothyroxine Sodium 125 Mcg Tabs (Levothyroxine sodium) .Marland Kitchen... 1 qd  Labs Reviewed: TSH: 0.85 (04/22/2009)    Chol: 193 (04/22/2009)   HDL: 79.50  (04/22/2009)   LDL: 106 (04/22/2009)   TG: 36.0 (04/22/2009) stable overall by hx and exam, ok to continue meds/tx as is   Complete Medication List: 1)  Carbamazepine 200 Mg Xr12h-tab (Carbamazepine) .... 5 by mouth once daily 2)  Meloxicam 15 Mg Tabs (Meloxicam) .Marland Kitchen.. 1 by mouth once daily 3)  Cyclobenzaprine Hcl 5 Mg Tabs (Cyclobenzaprine hcl) .Marland Kitchen.. 1 by mouth 3 times a day as needed 4)  Vitamin C 500 Mg Tabs (Ascorbic acid) .... 1/2 by mouth once daily 5)  Magnesium 250 Mg Tabs (Magnesium) .Marland Kitchen.. 1 by mouth once daily 6)  Caltrate 600+d 600-400 Mg-unit Tabs (Calcium carbonate-vitamin d) .Marland Kitchen.. 1 by mouth two times a day 7)  Calcium-vitamin D 600-200 Mg-unit Tabs (Calcium-vitamin d) .Marland Kitchen.. 1 by  mouth once daily 8)  Losartan Potassium 50 Mg Tabs (Losartan potassium) .Marland Kitchen.. 1po once daily 9)  Aspir-low 81 Mg Tbec (Aspirin) .Marland Kitchen.. 1 by mouth once daily 10)  Levothyroxine Sodium 125 Mcg Tabs (Levothyroxine sodium) .Marland Kitchen.. 1 qd 11)  Miralax Powd (Polyethylene glycol 3350) .... As per prep  instructions. 12)  Metoclopramide Hcl 10 Mg Tabs (Metoclopramide hcl) .... As per prep instructions. 13)  Dulcolax 5 Mg Tbec (Bisacodyl) .... Day before procedure take 2 at 3pm and 2 at 8pm.  Patient Instructions: 1)  please have your pap smear and mammogram in may 2011 (consider [facility]) 2)  Continue all previous medications as before this visit  3)  Please schedule a follow-up appointment in 1 year or sooner if needed

## 2010-05-02 NOTE — Letter (Signed)
Summary: Dr. Bradd Canary, M.D.  Dr. Bradd Canary, M.D.   Imported By: Sherian Rein 04/22/2009 07:18:59  _____________________________________________________________________  External Attachment:    Type:   Image     Comment:   External Document

## 2010-05-02 NOTE — Assessment & Plan Note (Signed)
Summary: FU ON TESTS/NWS  #   Vital Signs:  Patient profile:   70 year old female Height:      70 inches Weight:      175.13 pounds BMI:     25.22 O2 Sat:      96 % on Room air Temp:     98.9 degrees F oral Pulse rate:   87 / minute BP sitting:   132 / 80  (left arm) Cuff size:   regular  Vitals Entered By: Zella Ball Ewing CMA Duncan Dull) (January 24, 2010 10:04 AM)  O2 Flow:  Room air CC: Followup/RE   Primary Care Provider:  Corwin Levins MD  CC:  Followup/RE.  History of Present Illness: here to f/u; planning to travel to sister's in [place] and wants to know if she's ok to do so; needs new rx for flexeril as the pharmacy states they did not get refill;  Pt denies CP, worsening sob, doe, wheezing, orthopnea, pnd, worsening LE edema, palps, dizziness or syncope  Pt denies new neuro symptoms such as headache, facial or extremity weakness  Pt denies polydipsia, polyuria.  No fever, wt loss, night sweats, loss of appetite or other constitutional symptoms . Recent syncope w/u essentially neg, no further sympotms or sign or siezure.  No GU symptoms such as dysuria, freq, urgency or blood.  No overt bleeding or bruising.  Problems Prior to Update: 1)  Other Nonspecific Findings Examination of Blood  (ICD-790.99) 2)  Sweating  (ICD-780.8) 3)  Uti  (ICD-599.0) 4)  Syncope  (ICD-780.2) 5)  Goiter, Multinodular  (ICD-241.1) 6)  Preventive Health Care  (ICD-V70.0) 7)  Fatigue  (ICD-780.79) 8)  Thyroid Cancer, Hx of  (ICD-V10.87) 9)  Preventive Health Care  (ICD-V70.0) 10)  Seizure Disorder  (ICD-780.39) 11)  Hypertension  (ICD-401.9) 12)  Hyperlipidemia  (ICD-272.4) 13)  Allergic Rhinitis  (ICD-477.9) 14)  Hypothyroidism  (ICD-244.9)  Medications Prior to Update: 1)  Carbamazepine 200 Mg Tabs (Carbamazepine) .Marland Kitchen.. 1 and 1/2 Po Bid, and 1 By Mouth At Lunch and Dinner in Addition To Other Tegretol Dosing 2)  Meloxicam 15 Mg Tabs (Meloxicam) .Marland Kitchen.. 1 By Mouth Once Daily 3)  Cyclobenzaprine  Hcl 5 Mg Tabs (Cyclobenzaprine Hcl) .Marland Kitchen.. 1 By Mouth 3 Times A Day As Needed 4)  Vitamin C 500 Mg Tabs (Ascorbic Acid) .... 1/2 By Mouth Once Daily 5)  Magnesium 250 Mg Tabs (Magnesium) .Marland Kitchen.. 1 By Mouth Once Daily 6)  Caltrate 600+d 600-400 Mg-Unit Tabs (Calcium Carbonate-Vitamin D) .Marland Kitchen.. 1 By Mouth Two Times A Day 7)  Calcium-Vitamin D 600-200 Mg-Unit Tabs (Calcium-Vitamin D) .Marland Kitchen.. 1 By Mouth Once Daily 8)  Losartan Potassium 50 Mg Tabs (Losartan Potassium) .Marland Kitchen.. 1po Once Daily 9)  Aspir-Low 81 Mg Tbec (Aspirin) .Marland Kitchen.. 1 By Mouth Once Daily 10)  Levothyroxine Sodium 125 Mcg Tabs (Levothyroxine Sodium) .Marland Kitchen.. 1 Qd  Current Medications (verified): 1)  Carbamazepine 200 Mg Tabs (Carbamazepine) .Marland Kitchen.. 1 and 1/2 Po Bid, and 1 By Mouth At Lunch and Dinner in Addition To Other Tegretol Dosing 2)  Meloxicam 15 Mg Tabs (Meloxicam) .Marland Kitchen.. 1 By Mouth Once Daily 3)  Cyclobenzaprine Hcl 5 Mg Tabs (Cyclobenzaprine Hcl) .Marland Kitchen.. 1 By Mouth 3 Times A Day As Needed 4)  Vitamin C 500 Mg Tabs (Ascorbic Acid) .... 1/2 By Mouth Once Daily 5)  Magnesium 250 Mg Tabs (Magnesium) .Marland Kitchen.. 1 By Mouth Once Daily 6)  Caltrate 600+d 600-400 Mg-Unit Tabs (Calcium Carbonate-Vitamin D) .Marland Kitchen.. 1 By Mouth Two Times A  Day 7)  Calcium-Vitamin D 600-200 Mg-Unit Tabs (Calcium-Vitamin D) .Marland Kitchen.. 1 By Mouth Once Daily 8)  Losartan Potassium 50 Mg Tabs (Losartan Potassium) .Marland Kitchen.. 1po Once Daily 9)  Aspir-Low 81 Mg Tbec (Aspirin) .Marland Kitchen.. 1 By Mouth Once Daily 10)  Levothyroxine Sodium 125 Mcg Tabs (Levothyroxine Sodium) .Marland Kitchen.. 1 Qd  Allergies (verified): 1)  ! 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  , not ill appearing Head:  normocephalic and atraumatic.   Eyes:  vision grossly intact, pupils equal, and pupils round.   Ears:  R ear normal and L ear normal.   Nose:  no external deformity and no nasal discharge.   Mouth:  no gingival abnormalities and pharynx pink and moist.   Neck:  supple and no masses.   Lungs:  normal respiratory effort and normal breath sounds.   Heart:  normal rate and regular rhythm.   Abdomen:  soft, non-tender, and normal bowel sounds.   Msk:  no bilat flank tender Extremities:  no edema, no erythema  Neurologic:  cranial nerves II-XII intact, strength normal in all extremities, and gait normal.     Impression & Recommendations:  Problem # 1:  SYNCOPE (ICD-780.2) recent episode, w/u essentially neg, Ok for trip as she has planned, Continue all previous medications as before this visit   Problem # 2:  HYPERTENSION (ICD-401.9)  Her updated medication list for this problem includes:    Losartan Potassium 50 Mg Tabs (Losartan potassium) .Marland Kitchen... 1po once daily  BP today: 132/80 Prior BP: 142/82 (01/02/2010)  Labs Reviewed: K+: 3.9 (04/22/2009) Creat: : 0.8 (04/22/2009)   Chol: 193 (04/22/2009)   HDL: 79.50 (04/22/2009)   LDL: 106 (04/22/2009)   TG: 36.0 (04/22/2009) stable overall by hx and exam, ok to continue meds/tx as is   Problem # 3:  HYPERLIPIDEMIA (ICD-272.4)  Labs Reviewed: SGOT: 24 (04/22/2009)   SGPT: 26 (04/22/2009)   HDL:79.50 (04/22/2009)  LDL:106 (04/22/2009)  Chol:193 (04/22/2009)  Trig:36.0  (04/22/2009) d/w pt ; declines meds - Pt to continue diet efforts,  - goal LDL less than 100   Problem # 4:  UTI (ICD-599.0) resolved, no evidence of recurrence, ok to follow for now, pt reassured  Complete Medication List: 1)  Carbamazepine 200 Mg Tabs (Carbamazepine) .Marland Kitchen.. 1 and 1/2 po bid, and 1 by mouth at lunch and dinner in addition to other tegretol dosing 2)  Meloxicam 15 Mg Tabs (Meloxicam) .Marland Kitchen.. 1 by mouth once daily 3)  Cyclobenzaprine Hcl 5 Mg Tabs (Cyclobenzaprine hcl) .Marland Kitchen.. 1 by mouth 3 times a day as needed 4)  Vitamin C 500 Mg Tabs (Ascorbic acid) .... 1/2 by mouth once daily 5)  Magnesium 250 Mg Tabs (Magnesium) .Marland Kitchen.. 1 by mouth once daily 6)  Caltrate 600+d 600-400  Mg-unit Tabs (Calcium carbonate-vitamin d) .Marland Kitchen.. 1 by mouth two times a day 7)  Calcium-vitamin D 600-200 Mg-unit Tabs (Calcium-vitamin d) .Marland Kitchen.. 1 by mouth once daily 8)  Losartan Potassium 50 Mg Tabs (Losartan potassium) .Marland Kitchen.. 1po once daily 9)  Aspir-low 81 Mg Tbec (Aspirin) .Marland Kitchen.. 1 by mouth once daily 10)  Levothyroxine Sodium 125 Mcg Tabs (Levothyroxine sodium) .Marland Kitchen.. 1 qd  Patient Instructions: 1)  Continue all previous medications as before this visit  2)  You are OK to travel as you have planned 3)  Please schedule a follow-up appointment in 4 months with CPX labs Prescriptions: CYCLOBENZAPRINE HCL 5 MG TABS (CYCLOBENZAPRINE HCL) 1 by mouth 3 times a day as needed  #90 x 3   Entered and Authorized by:   Corwin Levins MD   Signed by:   Corwin Levins MD on 01/24/2010   Method used:   Print then Give to Patient   RxID:   [id number]    Orders Added: 1)  Est. Patient Level IV [96295]

## 2010-05-02 NOTE — Progress Notes (Signed)
----   Converted from flag ---- ---- 04/21/2009 10:45 AM, Dagoberto Reef wrote: Pt need appt with Dr Everardo All.   Thanks  ---- 04/21/2009 10:34 AM, Corwin Levins MD wrote: The following orders have been entered for this patient and placed on Admin Hold:  Type:     Referral       Code:   Radiology Description:   Radiology Referral Order Date:   04/21/2009   Authorized By:   Corwin Levins MD Order #:   [id number] Clinical Notes:   Name of Test or Procedure: thyroid u/s  Of What: Gave pt 05/04/09 @ 930A w/Dr Allie Dimmer Special Instructions ------------------------------

## 2010-05-02 NOTE — Assessment & Plan Note (Signed)
Summary: NEW MEDICARE ENDO PT--PER FLAG/MP-THY CA HX--STC   Vital Signs:  Patient profile:   70 year old female Height:      69.5 inches (176.53 cm) Weight:      184.50 pounds (83.86 kg) O2 Sat:      98 % on Room air Temp:     98.1 degrees F (36.72 degrees C) oral Pulse rate:   93 / minute BP sitting:   158 / 92  (left arm) Cuff size:   large  Vitals Entered By: Josph Macho CMA (May 04, 2009 9:21 AM)  O2 Flow:  Room air CC: New Endo: Thyroid/ CF Is Patient Diabetic? No   Referring Provider:  Corwin Levins MD  Primary Provider:  Corwin Levins MD  CC:  New Endo: Thyroid/ CF.  History of Present Illness: pt was noted in approx 1985 to have a goiter.  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  she was given the option of thyroid hormone suppression instead of surgery, and she has been on it ever since. symptomatically, she states 25 years of intermittent mild palpitations in the chest, but no associated numbness.  Current Medications (verified): 1)  Synthroid 125 Mcg Tabs (Levothyroxine Sodium) .Marland Kitchen.. 1 By Mouth Once Daily 2)  Carbamazepine 200 Mg Xr12h-Tab (Carbamazepine) .... 5 By Mouth Once Daily 3)  Meloxicam 15 Mg Tabs (Meloxicam) .Marland Kitchen.. 1 By Mouth Once Daily 4)  Cyclobenzaprine Hcl 5 Mg Tabs (Cyclobenzaprine Hcl) .Marland Kitchen.. 1 By Mouth 3 Times A Day As Needed 5)  Vitamin C 500 Mg Tabs (Ascorbic Acid) .... 1/2 By Mouth Once Daily 6)  Magnesium 250 Mg Tabs (Magnesium) .Marland Kitchen.. 1 By Mouth Once Daily 7)  Caltrate 600+d 600-400 Mg-Unit Tabs (Calcium Carbonate-Vitamin D) .Marland Kitchen.. 1 By Mouth Two Times A Day 8)  Calcium-Vitamin D 600-200 Mg-Unit Tabs (Calcium-Vitamin D) .Marland Kitchen.. 1 By Mouth Once Daily 9)  Losartan Potassium 50 Mg Tabs (Losartan Potassium) .Marland Kitchen.. 1po Once Daily 10)  Aspir-Low 81 Mg Tbec (Aspirin) .Marland Kitchen.. 1 By Mouth Once Daily  Allergies (verified): 1)  ! Sulfa  Past History:  Past Medical History: Last updated: 04/21/2009 Hypothyroidism DJD - right knee Allergic  rhinitis Hyperlipidemia Hypertension Seizure disorder- partial complex s/p thyroid cancer - 1985 left eye cataract hx of probable depression  Family History: strong FH siezures - 2 bro and 2 sis mother died at 40 from leukemia father died at 41yo with pancreatitis brother with MI brother with cerebral hemorrhage sister died with renal failure father with heart disease adn stroke mother with heart disesase sister with bipolar brother with bipolar and epilepsy mother had i-131 rx for a benign goiter  Social History: Reviewed history from 04/21/2009 and no changes required. 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         denies headache, hoarseness, double vision, chest pain, sob, diarrhea, polyuria, myalgias, excessive diaphoresis, recent seizures, tremor, anxiety, and menopausal sxs.  she reports intermittent easy bruising and rhinorrhea.   Physical Exam  General:  normal appearance.   Head:  head: no deformity eyes: no periorbital swelling, no proptosis external nose and ears are normal mouth: no lesion seen Neck:  i do not appreciate a goiter or thyroid nodule. Lungs:  Clear to auscultation bilaterally. Normal respiratory effort.  Heart:  Regular rate and  rhythm without murmurs or gallops noted. Normal S1,S2.   Msk:  muscle bulk and strength are grossly normal.  no obvious joint swelling.  gait is normal and steady  Extremities:  no deformity no edema Neurologic:  cn 2-12 grossly intact.   readily moves all 4's.   sensation is intact to touch on all 4's Skin:  normal texture and temp.  no rash.  not diaphoretic  Cervical Nodes:  No significant adenopathy.  Psych:  Alert and cooperative; normal mood and affect; normal attention span and concentration.   Additional Exam:  THYROID ULTRASOUND Scattered  subcentimeteric nodules as above.  No dominant nodule identified at this time. FastTSH     0.85 uIU/mL    Impression & Recommendations:  Problem # 1:  THYROID CANCER, HX OF (ICD-V10.87) this dx is very unlikely  Problem # 2:  GOITER, MULTINODULAR (ICD-241.1) very small  Problem # 3:  palpitations not thyroid-related  Medications Added to Medication List This Visit: 1)  Levothyroxine Sodium 125 Mcg Tabs (Levothyroxine sodium) .Marland Kitchen.. 1 qd  Other Orders: Consultation Level IV (54098)  Patient Instructions: 1)  same levothyroxine. 2)  you need only to have annual examination of the neck by a doctor, and blood test.  i would be happy to do this, or you may find it easier for dr Jonny Ruiz  to do this.  Prescriptions: LEVOTHYROXINE SODIUM 125 MCG TABS (LEVOTHYROXINE SODIUM) 1 qd  #30 x 11   Entered by:   Josph Macho CMA   Authorized by:   Minus Breeding MD   Signed by:   Josph Macho CMA on 05/05/2009   Method used:   Electronically to        [facility]. [phone]* (retail)       [address]       [place], [place]  [address]       Ph: [phone]       Fax: [phone]   RxID:   [id number]

## 2010-05-02 NOTE — Assessment & Plan Note (Signed)
Summary: new / medicare / #/ CD   Vital Signs:  Patient profile:   70 year old female Height:      69.5 inches Weight:      185 pounds BMI:     27.03 O2 Sat:      97 % on Room air Temp:     98 degrees F oral Pulse rate:   76 / minute BP sitting:   150 / 92  (left arm) Cuff size:   regular  Vitals Entered ByZella Ball Ewing (April 21, 2009 9:58 AM)  O2 Flow:  Room air  Preventive Care Screening  Last Flu Shot:    Date:  12/31/2008    Results:  Historical   Pap Smear:    Date:  07/16/2008    Results:  normal   Mammogram:    Date:  07/01/2008    Results:  normal   Last Pneumovax:    Date:  07/01/2008    Results:  Historical   Last Tetanus Booster:    Date:  07/01/2008    Results:  Historical   CC: New pt, new medicare/RE   CC:  New pt and new medicare/RE.  History of Present Illness: BP has been mild elevated more often than not at home for the past year;   Here for wellness Diet: Heart Healthy Physical Activities: Sedentary Depression/mood: None significant Hearing: Intact bilateral ADL's: Capable Fall Risk: None Home Safety: Good End-of-Life Planning: Advance directive - Full code   Preventive Screening-Counseling & Management  Alcohol-Tobacco     Smoking Status: never  Problems Prior to Update: 1)  Fatigue  (ICD-780.79) 2)  Thyroid Cancer, Hx of  (ICD-V10.87) 3)  Preventive Health Care  (ICD-V70.0) 4)  Seizure Disorder  (ICD-780.39) 5)  Hypertension  (ICD-401.9) 6)  Hyperlipidemia  (ICD-272.4) 7)  Allergic Rhinitis  (ICD-477.9) 8)  Hypothyroidism  (ICD-244.9)  Medications Prior to Update: 1)  None  Current Medications (verified): 1)  Synthroid 125 Mcg Tabs (Levothyroxine Sodium) .Marland Kitchen.. 1 By Mouth Once Daily 2)  Carbamazepine 200 Mg Xr12h-Tab (Carbamazepine) .... 5 By Mouth Once Daily 3)  Meloxicam 15 Mg Tabs (Meloxicam) .Marland Kitchen.. 1 By Mouth Once Daily 4)  Cyclobenzaprine Hcl 5 Mg Tabs (Cyclobenzaprine Hcl) .Marland Kitchen.. 1 By Mouth 3 Times A Day As  Needed 5)  Vitamin C 500 Mg Tabs (Ascorbic Acid) .... 1/2 By Mouth Once Daily 6)  Magnesium 250 Mg Tabs (Magnesium) .Marland Kitchen.. 1 By Mouth Once Daily 7)  Caltrate 600+d 600-400 Mg-Unit Tabs (Calcium Carbonate-Vitamin D) .Marland Kitchen.. 1 By Mouth Two Times A Day 8)  Calcium-Vitamin D 600-200 Mg-Unit Tabs (Calcium-Vitamin D) .Marland Kitchen.. 1 By Mouth Once Daily 9)  Losartan Potassium 50 Mg Tabs (Losartan Potassium) .Marland Kitchen.. 1po Once Daily 10)  Aspir-Low 81 Mg Tbec (Aspirin) .Marland Kitchen.. 1 By Mouth Once Daily  Allergies (verified): 1)  ! 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 strong FH siezures - 2 bro and 2 sis mother died at 35 from leukemia father died at 30yo with pancreatitis brother with MI brother with cerebral hemorrhage sister died with renal failure father with heart disease adn stroke mother with heart disesase sister with bipolar brother with bipolar and epilepsy  Social History: Reviewed  history and no changes required. 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         all otherwise negative per pt - 12 system review done , due for right knee surgury evnetually for the DJD;  has left knee pain as well - wears brace for the left knee for stability and pain  Physical Exam  General:  alert and overweight-appearing.   Head:  normocephalic and atraumatic.   Eyes:  vision grossly intact, pupils equal, and pupils round.   Ears:  R ear normal and L ear normal.   Nose:  no external deformity and no nasal discharge.   Mouth:  no gingival abnormalities and pharynx pink and moist.   Neck:  supple and no masses.   Lungs:  normal respiratory effort and normal breath sounds.   Heart:  normal rate and regular rhythm.   Abdomen:  soft, non-tender, and normal bowel sounds.   Msk:  no joint tenderness and no joint swelling.   Extremities:  no edema, no erythema  Neurologic:  cranial nerves II-XII intact and strength normal in all extremities.     Impression & Recommendations:  Problem # 1:  Preventive Health Care (ICD-V70.0)  Overall doing well, updated the age appropriate counseling and education;  routine health screening/prevention reviewed and done as appropriate unless declined, immunizations up to date or declined, diet counseling done if overweight, urged to quit smoking if smokes , most  recent labs reviewed and current ordered if appropriate, ecg reviewed or declined; information regarding Medicare Prevention requirements given if appropriate ; also due for colonscopy - will refer  Orders: Gastroenterology Referral (GI)  Problem # 2:  THYROID CANCER, HX OF (ICD-V10.87)  for f/u u/s, refer dr Everardo All, check TSH with labs above  Orders: Radiology Referral (Radiology) Endocrinology Referral (Endocrine)  Problem # 3:  HYPERTENSION (ICD-401.9)  Orders: EKG w/ Interpretation (93000)  Her updated medication list for this problem includes:    Losartan Potassium 50 Mg Tabs (Losartan potassium) .Marland Kitchen... 1po once daily treat as above, f/u any worsening signs or symptoms   BP today: 150/92  Problem # 4:  FATIGUE (ICD-780.79) exam benign, to check labs above; follow with expectant management   Problem # 5:  HYPERLIPIDEMIA (ICD-272.4) to check lipids - goal ldl less than 100  Problem # 6:  SEIZURE DISORDER (ICD-780.39)  Her updated medication list for this problem includes:  Carbamazepine 200 Mg Xr12h-tab (Carbamazepine) .Marland KitchenMarland KitchenMarland KitchenMarland Kitchen 5 by mouth once daily check level, o/w stable overall by hx and exam, ok to continue meds/tx as is   Complete Medication List: 1)  Synthroid 125 Mcg Tabs (Levothyroxine sodium) .Marland Kitchen.. 1 by mouth once daily 2)  Carbamazepine 200 Mg Xr12h-tab (Carbamazepine) .... 5 by mouth once daily 3)  Meloxicam 15 Mg Tabs (Meloxicam) .Marland Kitchen.. 1 by mouth once daily 4)  Cyclobenzaprine Hcl 5 Mg Tabs (Cyclobenzaprine hcl) .Marland Kitchen.. 1 by mouth 3 times a day as needed 5)  Vitamin C 500 Mg Tabs (Ascorbic acid) .... 1/2 by mouth once daily 6)  Magnesium 250 Mg Tabs (Magnesium) .Marland Kitchen.. 1 by mouth once daily 7)  Caltrate 600+d 600-400 Mg-unit Tabs (Calcium carbonate-vitamin d) .Marland Kitchen.. 1 by mouth two times a day 8)  Calcium-vitamin D 600-200 Mg-unit Tabs (Calcium-vitamin d) .Marland Kitchen.. 1 by mouth once daily 9)  Losartan Potassium 50 Mg Tabs (Losartan potassium) .Marland Kitchen.. 1po once daily 10)   Aspir-low 81 Mg Tbec (Aspirin) .Marland Kitchen.. 1 by mouth once daily  Patient Instructions: 1)  You will be contacted about the referral(s) to: colonoscopy, ultrasound of the thyroid, and Dr ellison/endocrinology 2)  Please go to the Lab in the basement for your blood and/or urine tests today  3)  please return Friday (tomorrow AM) for LAB only: 4)  cbc, bmet, LFT, TSH , UA- 780.79 5)  tegretol level - 780.39 6)  Lipids 272.0 7)  please call for your yearly mammogram 8)  start the losartan 50 mg per day 9)  Take an Aspirin every day - 81 mg - 1 per day - COATED only 10)  Please schedule a follow-up appointment in 1 month. Prescriptions: LOSARTAN POTASSIUM 50 MG TABS (LOSARTAN POTASSIUM) 1po once daily  #90 x 3   Entered and Authorized by:   Corwin Levins MD   Signed by:   Corwin Levins MD on 04/21/2009   Method used:   Print then Give to Patient   RxID:   [id number]    Immunization History:  Tetanus/Td Immunization History:    Tetanus/Td:  historical (07/01/2008)  Influenza Immunization History:    Influenza:  historical (12/31/2008)  Pneumovax Immunization History:    Pneumovax:  historical (07/01/2008)  Zostavax History:    Zostavax # 1:  zostavax (07/02/2007)     Immunization History:  Tetanus/Td Immunization History:    Tetanus/Td:  Historical (07/01/2008)  Influenza Immunization History:    Influenza:  Historical (12/31/2008)  Pneumovax Immunization History:    Pneumovax:  Historical (07/01/2008)  Zostavax History:    Zostavax # 1:  Zostavax (07/02/2007)

## 2010-06-15 LAB — CARDIAC PANEL(CRET KIN+CKTOT+MB+TROPI)
CK, MB: 2.8 ng/mL (ref 0.3–4.0)
CK, MB: 3.2 ng/mL (ref 0.3–4.0)
Total CK: 121 U/L (ref 7–177)
Troponin I: 0.02 ng/mL (ref 0.00–0.06)

## 2010-06-15 LAB — DIFFERENTIAL
Basophils Absolute: 0 10*3/uL (ref 0.0–0.1)
Basophils Relative: 0 % (ref 0–1)
Eosinophils Absolute: 0 10*3/uL (ref 0.0–0.7)
Eosinophils Relative: 0 % (ref 0–5)
Lymphocytes Relative: 10 % — ABNORMAL LOW (ref 12–46)
Neutrophils Relative %: 85 % — ABNORMAL HIGH (ref 43–77)

## 2010-06-15 LAB — URINE MICROSCOPIC-ADD ON

## 2010-06-15 LAB — BASIC METABOLIC PANEL
BUN: 18 mg/dL (ref 6–23)
CO2: 23 mEq/L (ref 19–32)
Calcium: 8.6 mg/dL (ref 8.4–10.5)
Chloride: 100 mEq/L (ref 96–112)
Creatinine, Ser: 1.11 mg/dL (ref 0.4–1.2)

## 2010-06-15 LAB — CBC
HCT: 33 % — ABNORMAL LOW (ref 36.0–46.0)
Hemoglobin: 11.6 g/dL — ABNORMAL LOW (ref 12.0–15.0)
MCH: 32.7 pg (ref 26.0–34.0)
MCH: 33.5 pg (ref 26.0–34.0)
MCV: 93 fL (ref 78.0–100.0)
MCV: 93.4 fL (ref 78.0–100.0)
Platelets: 177 10*3/uL (ref 150–400)
RBC: 3.55 MIL/uL — ABNORMAL LOW (ref 3.87–5.11)
RDW: 12.5 % (ref 11.5–15.5)

## 2010-06-15 LAB — COMPREHENSIVE METABOLIC PANEL
AST: 19 U/L (ref 0–37)
CO2: 28 mEq/L (ref 19–32)
Chloride: 98 mEq/L (ref 96–112)
Creatinine, Ser: 0.69 mg/dL (ref 0.4–1.2)
GFR calc Af Amer: >60 mL/min (ref >60)
GFR calc non Af Amer: >60 mL/min (ref >60)
Total Bilirubin: 0.6 mg/dL (ref 0.3–1.2)

## 2010-06-15 LAB — URINE CULTURE
Colony Count: 40000
Culture  Setup Time: 201109260927

## 2010-06-15 LAB — URINALYSIS, ROUTINE W REFLEX MICROSCOPIC
Glucose, UA: NEGATIVE mg/dL
Ketones, ur: 15 mg/dL — AB
Protein, ur: 30 mg/dL — AB
Urobilinogen, UA: 0.2 mg/dL (ref 0.0–1.0)

## 2010-06-15 LAB — CK TOTAL AND CKMB (NOT AT ARMC): Relative Index: 2.9 — ABNORMAL HIGH (ref 0.0–2.5)

## 2010-06-15 LAB — CARBAMAZEPINE LEVEL, TOTAL: Carbamazepine Lvl: 10.7 ug/mL (ref 4.0–12.0)

## 2010-06-15 LAB — TROPONIN I: Troponin I: 0.03 ng/mL (ref 0.00–0.06)

## 2010-07-19 ENCOUNTER — Other Ambulatory Visit: Payer: Self-pay | Admitting: Endocrinology

## 2010-08-03 ENCOUNTER — Encounter: Payer: Self-pay | Admitting: Internal Medicine

## 2010-08-13 ENCOUNTER — Encounter: Payer: Self-pay | Admitting: Internal Medicine

## 2010-08-13 DIAGNOSIS — Z0000 Encounter for general adult medical examination without abnormal findings: Secondary | ICD-10-CM | POA: Insufficient documentation

## 2010-08-16 ENCOUNTER — Encounter: Payer: Self-pay | Admitting: Internal Medicine

## 2010-08-16 ENCOUNTER — Other Ambulatory Visit (INDEPENDENT_AMBULATORY_CARE_PROVIDER_SITE_OTHER): Payer: Medicare Other

## 2010-08-16 ENCOUNTER — Ambulatory Visit (INDEPENDENT_AMBULATORY_CARE_PROVIDER_SITE_OTHER): Payer: Medicare Other | Admitting: Internal Medicine

## 2010-08-16 VITALS — BP 122/82 | HR 93 | Temp 98.3°F | Ht 70.0 in | Wt 171.5 lb

## 2010-08-16 DIAGNOSIS — I10 Essential (primary) hypertension: Secondary | ICD-10-CM

## 2010-08-16 DIAGNOSIS — Z0000 Encounter for general adult medical examination without abnormal findings: Secondary | ICD-10-CM

## 2010-08-16 LAB — CBC WITH DIFFERENTIAL/PLATELET
Basophils Absolute: 0 10*3/uL (ref 0.0–0.1)
Eosinophils Relative: 0.9 % (ref 0.0–5.0)
HCT: 36.6 % (ref 36.0–46.0)
Hemoglobin: 12.7 g/dL (ref 12.0–15.0)
Lymphocytes Relative: 16.9 % (ref 12.0–46.0)
Lymphs Abs: 1 10*3/uL (ref 0.7–4.0)
Monocytes Relative: 5.8 % (ref 3.0–12.0)
Neutro Abs: 4.3 10*3/uL (ref 1.4–7.7)
RDW: 13.5 % (ref 11.5–14.6)
WBC: 5.7 10*3/uL (ref 4.5–10.5)

## 2010-08-16 LAB — LIPID PANEL
Cholesterol: 163 mg/dL (ref 0–200)
VLDL: 8.2 mg/dL (ref 0.0–40.0)

## 2010-08-16 LAB — URINALYSIS, ROUTINE W REFLEX MICROSCOPIC
Bilirubin Urine: NEGATIVE
Hgb urine dipstick: NEGATIVE
Ketones, ur: NEGATIVE
Urine Glucose: NEGATIVE
Urobilinogen, UA: 0.2 (ref 0.0–1.0)

## 2010-08-16 LAB — TSH: TSH: 0.5 u[IU]/mL (ref 0.35–5.50)

## 2010-08-16 LAB — HEPATIC FUNCTION PANEL
ALT: 21 U/L (ref 0–35)
Bilirubin, Direct: 0.1 mg/dL (ref 0.0–0.3)
Total Protein: 5.8 g/dL — ABNORMAL LOW (ref 6.0–8.3)

## 2010-08-16 LAB — BASIC METABOLIC PANEL
BUN: 11 mg/dL (ref 6–23)
Creatinine, Ser: 0.5 mg/dL (ref 0.4–1.2)
GFR: 119.17 mL/min (ref >60.00)
Potassium: 4.4 mEq/L (ref 3.5–5.1)

## 2010-08-16 NOTE — Assessment & Plan Note (Signed)

## 2010-08-16 NOTE — Progress Notes (Signed)
Subjective:    Patient ID: Tammy Duke, female    DOB: 20-Jul-1940, 70 y.o.   MRN: [id number]  HPI  .Here for wellness and f/u;  Overall doing ok;  Pt denies CP, worsening SOB, DOE, wheezing, orthopnea, PND, worsening LE edema, palpitations, dizziness or syncope.  Pt denies neurological change such as new Headache, facial or extremity weakness.  Pt denies polydipsia, polyuria, or low sugar symptoms. Pt states overall good compliance with treatment and medications, good tolerability, and trying to follow lower cholesterol diet.  Pt denies worsening depressive symptoms, suicidal ideation or panic. No fever, wt loss, night sweats, loss of appetite, or other constitutional symptoms.  Pt states good ability with ADL's, low fall risk, home safety reviewed and adequate, no significant changes in hearing or vision, and occasionally active with exercise.  No further syncope since oct 2011. Past Medical History  Diagnosis Date  . GOITER, MULTINODULAR 05/04/2009  . HYPOTHYROIDISM 04/21/2009  . HYPERLIPIDEMIA 04/21/2009  . HYPERTENSION 04/21/2009  . ALLERGIC RHINITIS 04/21/2009  . SYNCOPE 01/02/2010  . SEIZURE DISORDER 04/21/2009  . FATIGUE 04/21/2009  . THYROID CANCER, HX OF 04/21/2009   Past Surgical History  Procedure Date  . S/p left radius fracture surgury 2001    Dr. Jerl Santos, Ortho.  . S/p thyroid surgury for cancer 1985  . Hx of right retenal detachment  Sept. 2005  . S/p right cataract with lens implant     reports that she has never smoked. She does not have any smokeless tobacco history on file. She reports that she does not drink alcohol or use illicit drugs. family history includes Bipolar disorder in her brother and sister; Goiter in her mother; Heart attack in her brother; Heart disease in her father and mother; Seizures in her other; and Stroke in her father. Allergies  Allergen Reactions  . Sulfonamide Derivatives     REACTION: caused heart to stop as child   Current Outpatient  Prescriptions on File Prior to Visit  Medication Sig Dispense Refill  . aspirin 81 MG EC tablet Take 81 mg by mouth daily.        . Calcium Carbonate-Vitamin D (CALCIUM-VITAMIN D) 600-200 MG-UNIT CAPS Take by mouth daily.        . Calcium Carbonate-Vitamin D (CALTRATE 600+D) 600-400 MG-UNIT per tablet Take 1 tablet by mouth 2 (two) times daily.        . carbamazepine (TEGRETOL) 200 MG tablet 1 and 1/2 by mouth twice a day, and 1 by mouth at lunch and dinner in addition to other Tegretol dosing.       . cyclobenzaprine (FLEXERIL) 5 MG tablet Take 5 mg by mouth 3 (three) times daily as needed.        Marland Kitchen levothyroxine (SYNTHROID, LEVOTHROID) 125 MCG tablet TAKE 1 TABLET BY MOUTH EVERY DAY  90 tablet  0  . losartan (COZAAR) 50 MG tablet Take 50 mg by mouth daily.        . Magnesium 250 MG TABS Take by mouth daily.        . meloxicam (MOBIC) 15 MG tablet Take 15 mg by mouth daily.        . vitamin C (ASCORBIC ACID) 500 MG tablet 1/2 by mouth once daily        Review of Systems Review of Systems  Constitutional: Negative for diaphoresis, activity change, appetite change and unexpected weight change.  HENT: Negative for hearing loss, ear pain, facial swelling, mouth sores and neck stiffness.   Eyes:  Negative for pain, redness and visual disturbance.  Respiratory: Negative for shortness of breath and wheezing.   Cardiovascular: Negative for chest pain and palpitations.  Gastrointestinal: Negative for diarrhea, blood in stool, abdominal distention and rectal pain.  Genitourinary: Negative for hematuria, flank pain and decreased urine volume.  Musculoskeletal: Negative for myalgias and joint swelling.  Skin: Negative for color change and wound.  Neurological: Negative for syncope and numbness.  Hematological: Negative for adenopathy.  Psychiatric/Behavioral: Negative for hallucinations, self-injury, decreased concentration and agitation.      Objective:   Physical Exam BP 122/82  Pulse 93   Temp(Src) 98.3 F (36.8 C) (Oral)  Ht 5\' 10"  (1.778 m)  Wt 171 lb 8 oz (77.792 kg)  BMI 24.61 kg/m2  SpO2 96% Physical Exam  VS noted Constitutional: Pt is oriented to person, place, and time. Appears well-developed and well-nourished.  HENT:  Head: Normocephalic and atraumatic.  Right Ear: External ear normal.  Left Ear: External ear normal.  Nose: Nose normal.  Mouth/Throat: Oropharynx is clear and moist.  Eyes: Conjunctivae and EOM are normal. Pupils are equal, round, and reactive to light.  Neck: Normal range of motion. Neck supple. No JVD present. No tracheal deviation present.  Cardiovascular: Normal rate, regular rhythm, normal heart sounds and intact distal pulses.   Pulmonary/Chest: Effort normal and breath sounds normal.  Abdominal: Soft. Bowel sounds are normal. There is no tenderness.  Musculoskeletal: Normal range of motion. Exhibits no edema.  Lymphadenopathy:  Has no cervical adenopathy.  Neurological: Pt is alert and oriented to person, place, and time. Pt has normal reflexes. No cranial nerve deficit.  Skin: Skin is warm and dry. No rash noted.  Psychiatric:  Has  normal mood and affect. Behavior is normal.        Assessment & Plan:

## 2010-08-16 NOTE — Patient Instructions (Addendum)
Continue all other medications as before Please go to LAB in the Basement for the blood and/or urine tests to be done today Please call the phone number [phone] (the PhoneTree System) for results of testing in 2-3 days;  When calling, simply dial the number, and when prompted enter the MRN number above (the Medical Record Number) and the # key, then the message should start. Please return in 1 year for your yearly visit, or sooner if needed, with Lab testing done 3-5 days before On review of your history, I would recommend that you see Dr Everardo All yearly for the thyroid cancer followup
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
## 2010-08-21 ENCOUNTER — Encounter: Payer: Self-pay | Admitting: Internal Medicine

## 2010-08-25 ENCOUNTER — Encounter: Payer: Self-pay | Admitting: Internal Medicine

## 2010-08-30 ENCOUNTER — Other Ambulatory Visit: Payer: Self-pay | Admitting: Internal Medicine

## 2010-10-18 ENCOUNTER — Ambulatory Visit (INDEPENDENT_AMBULATORY_CARE_PROVIDER_SITE_OTHER): Payer: Medicare Other | Admitting: Endocrinology

## 2010-10-18 ENCOUNTER — Encounter: Payer: Self-pay | Admitting: Endocrinology

## 2010-10-18 ENCOUNTER — Other Ambulatory Visit: Payer: Self-pay | Admitting: Endocrinology

## 2010-10-18 DIAGNOSIS — E042 Nontoxic multinodular goiter: Secondary | ICD-10-CM

## 2010-10-18 NOTE — Patient Instructions (Addendum)
Please continue the same thyroid medication. sometimes, a "lumpy thyroid" will eventually become overactive.  this is usually a slow process, happening over the span of many years. Please return in 1 year.

## 2010-10-18 NOTE — Progress Notes (Signed)
Subjective:    Patient ID: Tammy Duke, female    DOB: March 16, 1941, 70 y.o.   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  ultrasounds since then have showed a small multinodular goiter, so she has been reassigned a dx of benign multinodular goiter.  She does not notice any nodule at the neck.  pt states she feels well in general.   Past Medical History  Diagnosis Date  . GOITER, MULTINODULAR 05/04/2009  . HYPOTHYROIDISM 04/21/2009  . HYPERLIPIDEMIA 04/21/2009  . HYPERTENSION 04/21/2009  . ALLERGIC RHINITIS 04/21/2009  . SYNCOPE 01/02/2010  . SEIZURE DISORDER 04/21/2009  . FATIGUE 04/21/2009  . THYROID CANCER, HX OF 04/21/2009    Past Surgical History  Procedure Date  . S/p left radius fracture surgury 2001    Dr. Jerl Santos, Ortho.  . S/p thyroid surgury for cancer 1985  . Hx of right retenal detachment  Sept. 2005  . S/p right cataract with lens implant     History   Social History  . Marital Status: Widowed    Spouse Name: N/A    Number of Children: N/A  . Years of Education: N/A   Occupational History  . retired Engineer, water   Social History Main Topics  . Smoking status: Never Smoker   . Smokeless tobacco: Not on file  . Alcohol Use: No  . Drug Use: No  . Sexually Active: Not on file   Other Topics Concern  . Not on file   Social History Narrative   Lives alone in apt.    Current Outpatient Prescriptions on File Prior to Visit  Medication Sig Dispense Refill  . aspirin 81 MG EC tablet Take 81 mg by mouth daily.        . Calcium Carbonate-Vitamin D (CALCIUM-VITAMIN D) 600-200 MG-UNIT CAPS Take by mouth daily.        . Calcium Carbonate-Vitamin D (CALTRATE 600+D) 600-400 MG-UNIT per tablet Take 1 tablet by mouth 2 (two) times daily.        . carbamazepine (TEGRETOL) 200 MG tablet 1 and 1/2 by mouth twice a day, and 1 by mouth at lunch and dinner in addition to other Tegretol  dosing.       . cyclobenzaprine (FLEXERIL) 5 MG tablet TAKE 1 TABLET BY MOUTH THREE TIMES DAILY AS NEEDED  90 tablet  3  . losartan (COZAAR) 50 MG tablet Take 50 mg by mouth daily.        . Magnesium 250 MG TABS Take by mouth daily.        . meloxicam (MOBIC) 15 MG tablet Take 15 mg by mouth daily.        . vitamin C (ASCORBIC ACID) 500 MG tablet 1/2 by mouth once daily       . levothyroxine (SYNTHROID, LEVOTHROID) 125 MCG tablet TAKE 1 TABLET BY MOUTH EVERY DAY  90 tablet  0    Allergies  Allergen Reactions  . Sulfonamide Derivatives     REACTION: caused heart to stop as child    Family History  Problem Relation Age of Onset  . Heart disease Mother   . Goiter Mother     mother had i-131 rx for a benign goiter  . Heart disease Father   . Stroke Father   . Bipolar disorder Sister   . Heart attack Brother   . Bipolar disorder Brother   . Seizures Other  Strong family history of seizures, 2 brothers and 2 sisters    BP 128/78  Pulse 87  Temp(Src) 98.6 F (37 C) (Oral)  Ht 5\' 11"  (1.803 m)  Wt 168 lb (76.204 kg)  BMI 23.43 kg/m2  SpO2 98%  Review of Systems Denies neck pain.    Objective:   Physical Exam GENERAL: no distress Neck - No masses or thyromegaly or limitation in range of motion    Lab Results  Component Value Date   TSH 0.50 08/16/2010   Assessment & Plan:  Multinodular goiter, unchanged on exam Hypothyroidism, well-replaced.

## 2010-11-09 ENCOUNTER — Encounter (INDEPENDENT_AMBULATORY_CARE_PROVIDER_SITE_OTHER): Payer: Medicare Other | Admitting: Ophthalmology

## 2010-11-09 DIAGNOSIS — H43819 Vitreous degeneration, unspecified eye: Secondary | ICD-10-CM

## 2010-11-09 DIAGNOSIS — H348392 Tributary (branch) retinal vein occlusion, unspecified eye, stable: Secondary | ICD-10-CM

## 2010-11-09 DIAGNOSIS — H35039 Hypertensive retinopathy, unspecified eye: Secondary | ICD-10-CM

## 2010-11-29 ENCOUNTER — Other Ambulatory Visit: Payer: Self-pay | Admitting: Internal Medicine

## 2010-11-30 NOTE — Telephone Encounter (Signed)
Patient informed Medication done

## 2010-11-30 NOTE — Telephone Encounter (Signed)
Done per emr 

## 2010-11-30 NOTE — Telephone Encounter (Signed)
I have no record of prescribing this med in terms of refills in the past on centricity or Epic; I suspect she normally has this refilled by another MD  I would prefer this to continue if possible

## 2010-11-30 NOTE — Telephone Encounter (Signed)
Called the patient and she informed you did fill this medication 12/20/2009 #150 with 11 refills. Please advise

## 2011-01-09 ENCOUNTER — Ambulatory Visit (INDEPENDENT_AMBULATORY_CARE_PROVIDER_SITE_OTHER): Payer: Medicare Other

## 2011-01-09 DIAGNOSIS — Z23 Encounter for immunization: Secondary | ICD-10-CM

## 2011-01-15 ENCOUNTER — Other Ambulatory Visit: Payer: Self-pay | Admitting: Internal Medicine

## 2011-01-15 NOTE — Telephone Encounter (Signed)
Done per emr 

## 2011-01-25 ENCOUNTER — Other Ambulatory Visit: Payer: Self-pay | Admitting: Internal Medicine

## 2011-01-31 ENCOUNTER — Encounter (INDEPENDENT_AMBULATORY_CARE_PROVIDER_SITE_OTHER): Payer: Medicare Other | Admitting: Ophthalmology

## 2011-01-31 DIAGNOSIS — H43819 Vitreous degeneration, unspecified eye: Secondary | ICD-10-CM

## 2011-01-31 DIAGNOSIS — H348392 Tributary (branch) retinal vein occlusion, unspecified eye, stable: Secondary | ICD-10-CM

## 2011-01-31 DIAGNOSIS — H35039 Hypertensive retinopathy, unspecified eye: Secondary | ICD-10-CM

## 2011-02-28 ENCOUNTER — Other Ambulatory Visit: Payer: Self-pay | Admitting: Internal Medicine

## 2011-03-02 ENCOUNTER — Other Ambulatory Visit: Payer: Self-pay | Admitting: Internal Medicine

## 2011-03-02 NOTE — Telephone Encounter (Signed)
Done per emr 

## 2011-04-25 ENCOUNTER — Encounter (INDEPENDENT_AMBULATORY_CARE_PROVIDER_SITE_OTHER): Payer: Medicare Other | Admitting: Ophthalmology

## 2011-04-25 DIAGNOSIS — H35039 Hypertensive retinopathy, unspecified eye: Secondary | ICD-10-CM

## 2011-04-25 DIAGNOSIS — I10 Essential (primary) hypertension: Secondary | ICD-10-CM

## 2011-04-25 DIAGNOSIS — H348392 Tributary (branch) retinal vein occlusion, unspecified eye, stable: Secondary | ICD-10-CM

## 2011-04-25 DIAGNOSIS — H43819 Vitreous degeneration, unspecified eye: Secondary | ICD-10-CM

## 2011-04-25 DIAGNOSIS — H2510 Age-related nuclear cataract, unspecified eye: Secondary | ICD-10-CM

## 2011-07-11 ENCOUNTER — Other Ambulatory Visit: Payer: Self-pay | Admitting: Internal Medicine

## 2011-07-16 ENCOUNTER — Other Ambulatory Visit: Payer: Self-pay | Admitting: Internal Medicine

## 2011-07-18 ENCOUNTER — Other Ambulatory Visit: Payer: Self-pay | Admitting: Internal Medicine

## 2011-08-08 ENCOUNTER — Encounter (INDEPENDENT_AMBULATORY_CARE_PROVIDER_SITE_OTHER): Payer: Medicare Other | Admitting: Ophthalmology

## 2011-08-08 DIAGNOSIS — H35039 Hypertensive retinopathy, unspecified eye: Secondary | ICD-10-CM

## 2011-08-08 DIAGNOSIS — H348392 Tributary (branch) retinal vein occlusion, unspecified eye, stable: Secondary | ICD-10-CM

## 2011-08-08 DIAGNOSIS — I10 Essential (primary) hypertension: Secondary | ICD-10-CM

## 2011-08-24 ENCOUNTER — Other Ambulatory Visit: Payer: Self-pay | Admitting: Internal Medicine

## 2011-08-31 ENCOUNTER — Encounter: Payer: Self-pay | Admitting: Internal Medicine

## 2011-09-06 ENCOUNTER — Encounter: Payer: Self-pay | Admitting: Internal Medicine

## 2011-09-06 ENCOUNTER — Ambulatory Visit (INDEPENDENT_AMBULATORY_CARE_PROVIDER_SITE_OTHER)
Admission: RE | Admit: 2011-09-06 | Discharge: 2011-09-06 | Disposition: A | Discharge status: Home / Self Care | Payer: Medicare Other | Source: Ambulatory Visit

## 2011-09-06 ENCOUNTER — Ambulatory Visit (INDEPENDENT_AMBULATORY_CARE_PROVIDER_SITE_OTHER): Payer: Medicare Other | Admitting: Internal Medicine

## 2011-09-06 ENCOUNTER — Other Ambulatory Visit (INDEPENDENT_AMBULATORY_CARE_PROVIDER_SITE_OTHER): Payer: Medicare Other

## 2011-09-06 ENCOUNTER — Telehealth: Payer: Self-pay | Admitting: Internal Medicine

## 2011-09-06 ENCOUNTER — Other Ambulatory Visit: Payer: Self-pay | Admitting: Internal Medicine

## 2011-09-06 VITALS — BP 142/68 | HR 86 | Temp 97.8°F | Ht 71.0 in | Wt 162.0 lb

## 2011-09-06 DIAGNOSIS — N951 Menopausal and female climacteric states: Secondary | ICD-10-CM

## 2011-09-06 DIAGNOSIS — Z79899 Other long term (current) drug therapy: Secondary | ICD-10-CM

## 2011-09-06 DIAGNOSIS — Z136 Encounter for screening for cardiovascular disorders: Secondary | ICD-10-CM

## 2011-09-06 DIAGNOSIS — Z0000 Encounter for general adult medical examination without abnormal findings: Secondary | ICD-10-CM

## 2011-09-06 LAB — URINALYSIS, ROUTINE W REFLEX MICROSCOPIC
Bilirubin Urine: NEGATIVE
Hgb urine dipstick: NEGATIVE
Nitrite: NEGATIVE
pH: 6 (ref 5.0–8.0)

## 2011-09-06 LAB — HEPATIC FUNCTION PANEL
ALT: 18 U/L (ref 0–35)
Total Protein: 6.5 g/dL (ref 6.0–8.3)

## 2011-09-06 LAB — CBC WITH DIFFERENTIAL/PLATELET
Basophils Relative: 0.3 % (ref 0.0–3.0)
Eosinophils Absolute: 0 10*3/uL (ref 0.0–0.7)
Eosinophils Relative: 0.5 % (ref 0.0–5.0)
Lymphocytes Relative: 13.8 % (ref 12.0–46.0)
Neutrophils Relative %: 78.7 % — ABNORMAL HIGH (ref 43.0–77.0)
RBC: 3.84 Mil/uL — ABNORMAL LOW (ref 3.87–5.11)
WBC: 6.6 10*3/uL (ref 4.5–10.5)

## 2011-09-06 LAB — BASIC METABOLIC PANEL
BUN: 13 mg/dL (ref 6–23)
CO2: 28 mEq/L (ref 19–32)
Chloride: 96 mEq/L (ref 96–112)
Creatinine, Ser: 0.6 mg/dL (ref 0.4–1.2)
Glucose, Bld: 96 mg/dL (ref 70–99)

## 2011-09-06 LAB — LIPID PANEL
Cholesterol: 174 mg/dL (ref 0–200)
Triglycerides: 38 mg/dL (ref 0.0–149.0)

## 2011-09-06 MED ORDER — CYCLOBENZAPRINE HCL 5 MG PO TABS: 5.0000 mg | ORAL_TABLET | Freq: Three times a day (TID) | ORAL | Status: DC | PRN

## 2011-09-06 MED ORDER — LEVOTHYROXINE SODIUM 100 MCG PO TABS: 100.0000 ug | ORAL_TABLET | Freq: Every day | ORAL | Status: DC

## 2011-09-06 MED ORDER — LOSARTAN POTASSIUM 50 MG PO TABS: 50.0000 mg | ORAL_TABLET | Freq: Every day | ORAL | Status: DC

## 2011-09-06 MED ORDER — LEVOTHYROXINE SODIUM 125 MCG PO TABS: 125.0000 ug | ORAL_TABLET | Freq: Every day | ORAL | Status: DC

## 2011-09-06 MED ORDER — CEPHALEXIN 500 MG PO CAPS: 500.0000 mg | ORAL_CAPSULE | Freq: Four times a day (QID) | ORAL | Status: AC

## 2011-09-06 MED ORDER — CARBAMAZEPINE 200 MG PO TABS: ORAL_TABLET | ORAL | Status: DC

## 2011-09-06 MED ORDER — MELOXICAM 15 MG PO TABS: 15.0000 mg | ORAL_TABLET | Freq: Every day | ORAL | Status: DC | PRN

## 2011-09-06 NOTE — Progress Notes (Signed)
Subjective:    Patient ID: UMI MAINOR, female    DOB: 09-09-1940, 71 y.o.   MRN: [id number]  HPI  Here for wellness and f/u;  Overall doing ok;  Pt denies CP, worsening SOB, DOE, wheezing, orthopnea, PND, worsening LE edema, palpitations, dizziness or syncope.  Pt denies neurological change such as new Headache, facial or extremity weakness.  Pt denies polydipsia, polyuria, or low sugar symptoms. Pt states overall good compliance with treatment and medications, good tolerability, and trying to follow lower cholesterol diet.  Pt denies worsening depressive symptoms, suicidal ideation or panic. No fever, unintentional wt loss, night sweats, loss of appetite, or other constitutional symptoms.  Pt states good ability with ADL's, low fall risk, home safety reviewed and adequate, no significant changes in hearing or vision, and occasionally active with exercise.  BP usually 120's SDP at home, checks regular. Had stress test about 18 mo ago  - neg for ischemia.   Has lost a few lbs intentional with better diet. Works 20 hrs per wk at [employer] (does not eat there, is mostly vegetarian), needs refill mobic for her chronic right knee pain, for which she is trying to avoid surgury she has been told she will eventually need Past Medical History  Diagnosis Date  . GOITER, MULTINODULAR 05/04/2009  . HYPOTHYROIDISM 04/21/2009  . HYPERLIPIDEMIA 04/21/2009  . HYPERTENSION 04/21/2009  . ALLERGIC RHINITIS 04/21/2009  . SYNCOPE 01/02/2010  . SEIZURE DISORDER 04/21/2009  . FATIGUE 04/21/2009  . THYROID CANCER, HX OF 04/21/2009   Past Surgical History  Procedure Date  . S/p left radius fracture surgury 2001    Dr. Jerl Santos, Ortho.  . S/p thyroid surgury for cancer 1985  . Hx of right retenal detachment  Sept. 2005  . S/p right cataract with lens implant     reports that she has never smoked. She does not have any smokeless tobacco history on file. She reports that she does not drink alcohol or use illicit  drugs. family history includes Bipolar disorder in her brother and sister; Goiter in her mother; Heart attack in her brother; Heart disease in her father and mother; Seizures in her other; and Stroke in her father. Allergies  Allergen Reactions  . Sulfonamide Derivatives     REACTION: caused heart to stop as child   Current Outpatient Prescriptions on File Prior to Visit  Medication Sig Dispense Refill  . aspirin 81 MG EC tablet Take 81 mg by mouth daily.        . Calcium Carbonate-Vitamin D (CALCIUM-VITAMIN D) 600-200 MG-UNIT CAPS Take by mouth daily.        . Calcium Carbonate-Vitamin D (CALTRATE 600+D) 600-400 MG-UNIT per tablet Take 1 tablet by mouth 2 (two) times daily.        . carbamazepine (TEGRETOL) 200 MG tablet TAKE 5 TABLETS BY MOUTH ONCE DAILY  150 tablet  10  . cyclobenzaprine (FLEXERIL) 5 MG tablet TAKE 1 TABLET BY MOUTH THREE TIMES DAILY AS NEEDED  90 tablet  0  . levothyroxine (SYNTHROID, LEVOTHROID) 125 MCG tablet TAKE 1 TABLET BY MOUTH EVERY DAY  90 tablet  3  . losartan (COZAAR) 50 MG tablet TAKE 1 TABLET BY MOUTH EVERY DAY  90 tablet  2  . Magnesium 250 MG TABS Take by mouth daily.        . meloxicam (MOBIC) 15 MG tablet TAKE 1 TABLET BY MOUTH EVERY DAY  90 tablet  0  . vitamin C (ASCORBIC ACID) 500 MG tablet 1/2  by mouth once daily       . DISCONTD: carbamazepine (TEGRETOL) 200 MG tablet TAKE 5 TABLETS BY MOUTH ONCE DAILY  150 tablet  10   Review of Systems Review of Systems  Constitutional: Negative for diaphoresis, activity change, appetite change and unexpected weight change.  HENT: Negative for hearing loss, ear pain, facial swelling, mouth sores and neck stiffness.   Eyes: Negative for pain, redness and visual disturbance.  Respiratory: Negative for shortness of breath and wheezing.   Cardiovascular: Negative for chest pain and palpitations.  Gastrointestinal: Negative for diarrhea, blood in stool, abdominal distention and rectal pain.  Genitourinary: Negative  for hematuria, flank pain and decreased urine volume.  Musculoskeletal: Negative for myalgias and joint swelling.  Skin: Negative for color change and wound.  Neurological: Negative for syncope and numbness.  Hematological: Negative for adenopathy.  Psychiatric/Behavioral: Negative for hallucinations, self-injury, decreased concentration and agitation.      Objective:   Physical Exam BP 142/68  Pulse 86  Temp(Src) 97.8 F (36.6 C) (Oral)  Ht 5\' 11"  (1.803 m)  Wt 162 lb (73.483 kg)  BMI 22.59 kg/m2  SpO2 97% Physical Exam  VS noted Constitutional: Pt is oriented to person, place, and time. Appears well-developed and well-nourished.  HENT:  Head: Normocephalic and atraumatic.  Right Ear: External ear normal.  Left Ear: External ear normal.  Nose: Nose normal.  Mouth/Throat: Oropharynx is clear and moist.  Eyes: Conjunctivae and EOM are normal. Pupils are equal, round, and reactive to light.  Neck: Normal range of motion. Neck supple. No JVD present. No tracheal deviation present.  Cardiovascular: Normal rate, regular rhythm, normal heart sounds and intact distal pulses.   Pulmonary/Chest: Effort normal and breath sounds normal.  Abdominal: Soft. Bowel sounds are normal. There is no tenderness.  Musculoskeletal: Normal range of motion. Exhibits no edema.  Lymphadenopathy:  Has no cervical adenopathy.  Neurological: Pt is alert and oriented to person, place, and time. Pt has normal reflexes. No cranial nerve deficit.  Skin: Skin is warm and dry. No rash noted.  Right knee :  No effusion, had chronic bony change and decreased ROM, unable to extend completely Psychiatric:  Has  normal mood and affect. Behavior is normal. 1+ nervous    Assessment & Plan:

## 2011-09-06 NOTE — Telephone Encounter (Signed)
Labs with UA today c/w UTI    Other labs ok  antibx sent to pharmacy - robin to let pt know  Full letter to come in the mail

## 2011-09-06 NOTE — Assessment & Plan Note (Signed)
Due for dxa -will order 

## 2011-09-06 NOTE — Assessment & Plan Note (Addendum)

## 2011-09-06 NOTE — Patient Instructions (Addendum)
Continue all other medications as before All of your medications were refilled to the pharmacy Please go to LAB in the Basement for the blood and/or urine tests to be done today You will be contacted by phone if any changes need to be made immediately.  Otherwise, you will receive a letter about your results with an explanation. Please also schedule a bone density test before leaving with the schedulers to be done soon Please keep your appointments with your specialists as you have planned - Dr Everardo All soon Please continue to monitor your Blood Pressure at home as well Please return in 1 year for your yearly visit, or sooner if needed, with Lab testing done 3-5 days before

## 2011-09-06 NOTE — Telephone Encounter (Signed)
Patient informed of results and antibiotic called in.

## 2011-10-22 ENCOUNTER — Ambulatory Visit (INDEPENDENT_AMBULATORY_CARE_PROVIDER_SITE_OTHER): Payer: Medicare Other | Admitting: Internal Medicine

## 2011-10-22 ENCOUNTER — Encounter: Payer: Self-pay | Admitting: Endocrinology

## 2011-10-22 ENCOUNTER — Ambulatory Visit (INDEPENDENT_AMBULATORY_CARE_PROVIDER_SITE_OTHER): Payer: Medicare Other | Admitting: Endocrinology

## 2011-10-22 VITALS — BP 122/72 | HR 88 | Temp 97.9°F | Ht 71.0 in | Wt 164.2 lb

## 2011-10-22 VITALS — BP 122/72 | HR 88 | Temp 97.9°F | Ht 71.0 in | Wt 164.0 lb

## 2011-10-22 DIAGNOSIS — M810 Age-related osteoporosis without current pathological fracture: Secondary | ICD-10-CM

## 2011-10-22 DIAGNOSIS — E785 Hyperlipidemia, unspecified: Secondary | ICD-10-CM

## 2011-10-22 DIAGNOSIS — E039 Hypothyroidism, unspecified: Secondary | ICD-10-CM

## 2011-10-22 DIAGNOSIS — I10 Essential (primary) hypertension: Secondary | ICD-10-CM

## 2011-10-22 MED ORDER — CARBAMAZEPINE 200 MG PO TABS: ORAL_TABLET | ORAL | Status: DC

## 2011-10-22 NOTE — Progress Notes (Signed)
Subjective:    Patient ID: Tammy Duke, female    DOB: [birth year], 71 y.o.   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  ultrasounds since then have showed a small multinodular goiter, so she has been reassigned a dx of benign multinodular goiter.  She does not notice any nodule at the neck.  pt states she feels well in general.   Hypothyroidism: Based on tsh, synthroid was reduced from 125 to 100 mcg/d.  She has lost a few lbs, due to her efforts. Mild hypocalcemia was noted in 2011.  She has intermittent leg cramps.   Past Medical History  Diagnosis Date  . GOITER, MULTINODULAR 05/04/2009  . HYPOTHYROIDISM 04/21/2009  . HYPERLIPIDEMIA 04/21/2009  . HYPERTENSION 04/21/2009  . ALLERGIC RHINITIS 04/21/2009  . SYNCOPE 01/02/2010  . SEIZURE DISORDER 04/21/2009  . FATIGUE 04/21/2009  . THYROID CANCER, HX OF 04/21/2009    Past Surgical History  Procedure Date  . S/p left radius fracture surgury 2001    Dr. Jerl Santos, Ortho.  . S/p thyroid surgury for cancer 1985  . Hx of right retenal detachment  Sept. 2005  . S/p right cataract with lens implant     History   Social History  . Marital Status: Widowed    Spouse Name: N/A    Number of Children: N/A  . Years of Education: N/A   Occupational History  . retired Engineer, water   Social History Main Topics  . Smoking status: Never Smoker   . Smokeless tobacco: Not on file  . Alcohol Use: No  . Drug Use: No  . Sexually Active: Not on file   Other Topics Concern  . Not on file   Social History Narrative   Lives alone in apt.    Current Outpatient Prescriptions on File Prior to Visit  Medication Sig Dispense Refill  . aspirin 81 MG EC tablet Take 81 mg by mouth daily.        . Calcium Carbonate-Vitamin D (CALCIUM-VITAMIN D) 600-200 MG-UNIT CAPS Take by mouth daily.          . Calcium Carbonate-Vitamin D (CALTRATE 600+D) 600-400 MG-UNIT per tablet Take 1 tablet by mouth 2 (two) times daily.        . cyclobenzaprine (FLEXERIL) 5 MG tablet Take 1 tablet (5 mg total) by mouth 3 (three) times daily as needed for muscle spasms.  90 tablet  2  . levothyroxine (SYNTHROID, LEVOTHROID) 100 MCG tablet Take 1 tablet (100 mcg total) by mouth daily.  90 tablet  3  . losartan (COZAAR) 50 MG tablet Take 1 tablet (50 mg total) by mouth daily.  90 tablet  3  . Magnesium 250 MG TABS Take by mouth daily.        . meloxicam (MOBIC) 15 MG tablet Take 1 tablet (15 mg total) by mouth daily as needed for pain.  90 tablet  3  . vitamin C (ASCORBIC ACID) 500 MG tablet 1/2 by mouth once daily       . DISCONTD: carbamazepine (TEGRETOL) 200 MG tablet Take 1 and 1/2 by mouth twice a day and 1 by mouth at lunch and diner.        Allergies  Allergen Reactions  . Sulfonamide Derivatives     REACTION: caused heart to stop as child    Family History  Problem Relation Age of Onset  . Heart disease Mother   . Goiter Mother     mother had i-131 rx for a benign goiter  . Heart disease Father   . Stroke Father   . Bipolar disorder Sister   . Heart attack Brother   . Bipolar disorder Brother   . Seizures Other     Strong family history of seizures, 2 brothers and 2 sisters   BP 122/72  Pulse 88  Temp 97.9 F (36.6 C) (Oral)  Ht 5\' 11"  (1.803 m)  Wt 164 lb (74.39 kg)  BMI 22.87 kg/m2  SpO2 97%  Review of Systems Denies neck pain and numbness    Objective:   Physical Exam VITAL SIGNS:  See vs page GENERAL: no distress NECK: There is no palpable thyroid enlargement.  No thyroid nodule is palpable.  No palpable lymphadenopathy at the anterior neck.   Lab Results  Component Value Date   TSH 0.16* 09/06/2011      Assessment & Plan:  Hypothyroidism, now on lower dosage Multinodular goiter, not palpable on exam Hypocalcemia, mild.

## 2011-10-22 NOTE — Patient Instructions (Addendum)
sometimes, a "lumpy thyroid" will eventually become overactive.  this is usually a slow process, happening over the span of many years. Please return in 1 year. Please do blood tests in 2 weeks.  You will receive a letter with results.

## 2011-10-22 NOTE — Patient Instructions (Addendum)
Please call if your wish to take the prolia Continue all other medications as before Your tegretol refill was sent in Please keep your appointments with your specialists as you have planned - Dr Everardo All Please return in 11 months for your yearly visit, or sooner if needed, with Lab testing done 3-5 days before

## 2011-10-22 NOTE — Progress Notes (Signed)
Subjective:    Patient ID: Tammy Duke, female    DOB: 10-05-1940, 71 y.o.   MRN: [id number]  HPI  Here to f/u; did reduce the TSH and plans to f/u with Dr Everardo All about this later today,  overall doing ok,  Pt denies chest pain, increased sob or doe, wheezing, orthopnea, PND, increased LE swelling, palpitations, dizziness or syncope.  Pt denies new neurological symptoms such as new headache, or facial or extremity weakness or numbness   Pt denies polydipsia, polyuria,  Pt states overall good compliance with meds, trying to follow lower cholesterol diet, wt overall stable but little exercise however.   Pt denies fever, wt loss, night sweats, loss of appetite, or other constitutional symptoms.  No recent siezure.  Nasal allergy symptoms minor at this point.  Asks about Prolia for her osteoporosis, still deciding whether to take or not. Past Medical History  Diagnosis Date  . GOITER, MULTINODULAR 05/04/2009  . HYPOTHYROIDISM 04/21/2009  . HYPERLIPIDEMIA 04/21/2009  . HYPERTENSION 04/21/2009  . ALLERGIC RHINITIS 04/21/2009  . SYNCOPE 01/02/2010  . SEIZURE DISORDER 04/21/2009  . FATIGUE 04/21/2009  . THYROID CANCER, HX OF 04/21/2009   Past Surgical History  Procedure Date  . S/p left radius fracture surgury 2001    Dr. Jerl Santos, Ortho.  . S/p thyroid surgury for cancer 1985  . Hx of right retenal detachment  Sept. 2005  . S/p right cataract with lens implant     reports that she has never smoked. She does not have any smokeless tobacco history on file. She reports that she does not drink alcohol or use illicit drugs. family history includes Bipolar disorder in her brother and sister; Goiter in her mother; Heart attack in her brother; Heart disease in her father and mother; Seizures in her other; and Stroke in her father. Allergies  Allergen Reactions  . Sulfonamide Derivatives     REACTION: caused heart to stop as child   Current Outpatient Prescriptions on File Prior to Visit  Medication  Sig Dispense Refill  . aspirin 81 MG EC tablet Take 81 mg by mouth daily.        . Calcium Carbonate-Vitamin D (CALCIUM-VITAMIN D) 600-200 MG-UNIT CAPS Take by mouth daily.        . Calcium Carbonate-Vitamin D (CALTRATE 600+D) 600-400 MG-UNIT per tablet Take 1 tablet by mouth 2 (two) times daily.        . carbamazepine (TEGRETOL) 200 MG tablet Take 1 and 1/2 by mouth twice a day and 1 by mouth at lunch and diner.      . cyclobenzaprine (FLEXERIL) 5 MG tablet Take 1 tablet (5 mg total) by mouth 3 (three) times daily as needed for muscle spasms.  90 tablet  2  . levothyroxine (SYNTHROID, LEVOTHROID) 100 MCG tablet Take 1 tablet (100 mcg total) by mouth daily.  90 tablet  3  . losartan (COZAAR) 50 MG tablet Take 1 tablet (50 mg total) by mouth daily.  90 tablet  3  . Magnesium 250 MG TABS Take by mouth daily.        . meloxicam (MOBIC) 15 MG tablet Take 1 tablet (15 mg total) by mouth daily as needed for pain.  90 tablet  3  . vitamin C (ASCORBIC ACID) 500 MG tablet 1/2 by mouth once daily        Review of Systems Review of Systems  Constitutional: Negative for diaphoresis and unexpected weight change.  HENT: Negative for tinnitus.   Eyes: Negative  for photophobia and visual disturbance.  Respiratory: Negative for choking and stridor.   Gastrointestinal: Negative for vomiting and blood in stool.  Genitourinary: Negative for hematuria and decreased urine volume.  Musculoskeletal: Negative for gait problem.  Skin: Negative for color change and wound. .      Objective:   Physical Exam BP 122/72  Pulse 88  Temp 97.9 F (36.6 C) (Oral)  Ht 5\' 11"  (1.803 m)  Wt 164 lb 4 oz (74.503 kg)  BMI 22.91 kg/m2  SpO2 97% Physical Exam  VS noted, not ill appearing Constitutional: Pt appears well-developed and well-nourished.  HENT: Head: Normocephalic.  Right Ear: External ear normal.  Left Ear: External ear normal.  Eyes: Conjunctivae and EOM are normal. Pupils are equal, round, and reactive to  light.  Neck: Normal range of motion. Neck supple.  Cardiovascular: Normal rate and regular rhythm.   Pulmonary/Chest: Effort normal and breath sounds normal.  Neurological: Pt is alert. Not confused Skin: Skin is warm. No erythema. No rash Psychiatric: Pt behavior is normal. Thought content normal.     Assessment & Plan:

## 2011-10-28 ENCOUNTER — Encounter: Payer: Self-pay | Admitting: Internal Medicine

## 2011-10-28 DIAGNOSIS — M810 Age-related osteoporosis without current pathological fracture: Secondary | ICD-10-CM

## 2011-10-28 HISTORY — DX: Age-related osteoporosis without current pathological fracture: M81.0

## 2011-10-28 NOTE — Assessment & Plan Note (Signed)
stable overall by hx and exam, most recent data reviewed with pt, and pt to continue medical treatment as before BP Readings from Last 3 Encounters:  10/22/11 122/72  10/22/11 122/72  09/06/11 142/68

## 2011-10-28 NOTE — Assessment & Plan Note (Signed)
stable overall by hx and exam, most recent data reviewed with pt, and pt to continue medical treatment as before Lab Results  Component Value Date   LDLCALC 73 09/06/2011

## 2011-10-28 NOTE — Assessment & Plan Note (Signed)
D/w pt tx with prolia , pt will cont to try to decide

## 2011-11-05 ENCOUNTER — Other Ambulatory Visit (INDEPENDENT_AMBULATORY_CARE_PROVIDER_SITE_OTHER): Payer: Medicare Other

## 2011-11-05 DIAGNOSIS — E039 Hypothyroidism, unspecified: Secondary | ICD-10-CM

## 2011-11-06 ENCOUNTER — Encounter: Payer: Self-pay | Admitting: Endocrinology

## 2011-11-06 LAB — PTH, INTACT AND CALCIUM: Calcium, Total (PTH): 8.7 mg/dL (ref 8.4–10.5)

## 2011-11-08 ENCOUNTER — Encounter (INDEPENDENT_AMBULATORY_CARE_PROVIDER_SITE_OTHER): Payer: Medicare Other | Admitting: Ophthalmology

## 2011-11-08 DIAGNOSIS — I10 Essential (primary) hypertension: Secondary | ICD-10-CM

## 2011-11-08 DIAGNOSIS — H348392 Tributary (branch) retinal vein occlusion, unspecified eye, stable: Secondary | ICD-10-CM

## 2011-11-08 DIAGNOSIS — H2510 Age-related nuclear cataract, unspecified eye: Secondary | ICD-10-CM

## 2011-11-08 DIAGNOSIS — H35039 Hypertensive retinopathy, unspecified eye: Secondary | ICD-10-CM

## 2011-11-08 DIAGNOSIS — H43819 Vitreous degeneration, unspecified eye: Secondary | ICD-10-CM

## 2011-12-06 ENCOUNTER — Ambulatory Visit (INDEPENDENT_AMBULATORY_CARE_PROVIDER_SITE_OTHER): Payer: Medicare Other | Admitting: Family Medicine

## 2011-12-06 VITALS — BP 143/80 | HR 79 | Temp 98.5°F | Resp 18 | Ht 68.75 in | Wt 159.0 lb

## 2011-12-06 DIAGNOSIS — Z1211 Encounter for screening for malignant neoplasm of colon: Secondary | ICD-10-CM

## 2011-12-06 DIAGNOSIS — R300 Dysuria: Secondary | ICD-10-CM

## 2011-12-06 DIAGNOSIS — N898 Other specified noninflammatory disorders of vagina: Secondary | ICD-10-CM

## 2011-12-06 LAB — POCT URINALYSIS DIPSTICK
Glucose, UA: NEGATIVE
Ketones, UA: 15
Nitrite, UA: NEGATIVE
Protein, UA: >=300
Spec Grav, UA: 1.025
Urobilinogen, UA: 1
pH, UA: 8.5

## 2011-12-06 LAB — POCT WET PREP WITH KOH
KOH Prep POC: NEGATIVE
Trichomonas, UA: NEGATIVE
Yeast Wet Prep HPF POC: NEGATIVE

## 2011-12-06 LAB — POCT UA - MICROSCOPIC ONLY
Casts, Ur, LPF, POC: NEGATIVE
Crystals, Ur, HPF, POC: NEGATIVE
Epithelial cells, urine per micros: NEGATIVE
Mucus, UA: NEGATIVE
RBC, urine, microscopic: NEGATIVE
Yeast, UA: NEGATIVE

## 2011-12-06 MED ORDER — CIPROFLOXACIN HCL 500 MG PO TABS: 500.0000 mg | ORAL_TABLET | Freq: Two times a day (BID) | ORAL | Status: DC

## 2011-12-06 NOTE — Patient Instructions (Addendum)
1. Dysuria  POCT urinalysis dipstick, POCT UA - Microscopic Only, POCT Wet Prep with KOH, Urine culture, ciprofloxacin (CIPRO) 500 MG tablet  2. Vaginal Discharge  POCT Wet Prep with KOH, Urine culture  3. Colon cancer screening  IFOBT POC (occult bld, rslt in office)

## 2011-12-06 NOTE — Progress Notes (Signed)
Reviewed and agree.

## 2011-12-06 NOTE — Progress Notes (Signed)
Subjective:    Patient ID: Tammy Duke, female    DOB: 22-Oct-1940, 71 y.o.   MRN: [id number]  HPI This 71 y.o. female presents for evaluation of vaginal discharge.  Lower pelvic pain onset three days ago.  +vaginal discharge brownish; looks like feces.  Does have a foul odor but does not smell like feces.  After urinating, ?vaginal discharge is expressed.  No dysuria; +frequency; +nocturia x every hour; baseline x 3 per night.  Mild blood in urine or with wiping. No pain with wiping.  No vaginal itching.  No hysterectomy.  Menopause late 60s. Last pap smear 1.5 years ago by GYN office; scheduled to see [facility] on 12/29/11 for annual visit/pap smear.  No history of abnormal pap smears. No history of abdominal surgeries.  Minimal vaginal discharge on pad.  No fecal incontinence issues.   No fever/chills/sweats. No nausea, vomiting, diarrhea.  No abdominal pain. +lower pelvic pain.  Not sexually active in several years.    Review of Systems  Constitutional: Negative for fever, chills and fatigue.  Gastrointestinal: Negative for nausea, vomiting, abdominal pain and diarrhea.  Genitourinary: Positive for dysuria, urgency, frequency, vaginal discharge and pelvic pain. Negative for hematuria, flank pain, vaginal bleeding, genital sores and vaginal pain.    Past Medical History  Diagnosis Date  . GOITER, MULTINODULAR 05/04/2009  . HYPOTHYROIDISM 04/21/2009  . HYPERLIPIDEMIA 04/21/2009  . HYPERTENSION 04/21/2009  . ALLERGIC RHINITIS 04/21/2009  . SYNCOPE 01/02/2010  . SEIZURE DISORDER 04/21/2009  . FATIGUE 04/21/2009  . THYROID CANCER, HX OF 04/21/2009  . Osteoporosis 10/28/2011    Past Surgical History  Procedure Date  . S/p left radius fracture surgury 2001    Dr. Jerl Santos, Ortho.  . S/p thyroid surgury for cancer 1985  . Hx of right retenal detachment  Sept. 2005  . S/p right cataract with lens implant     Prior to Admission medications   Medication Sig Start Date End Date Taking?  Authorizing Provider  aspirin 81 MG EC tablet Take 81 mg by mouth daily.     Yes Historical Provider, MD  Calcium Carbonate-Vitamin D (CALCIUM-VITAMIN D) 600-200 MG-UNIT CAPS Take by mouth daily.     Yes Historical Provider, MD  Calcium Carbonate-Vitamin D (CALTRATE 600+D) 600-400 MG-UNIT per tablet Take 1 tablet by mouth 2 (two) times daily.     Yes Historical Provider, MD  carbamazepine (TEGRETOL) 200 MG tablet Take 1 and 1/2 by mouth twice a day and 1 by mouth at lunch and dinner. 10/22/11  Yes Corwin Levins, MD  cyclobenzaprine (FLEXERIL) 5 MG tablet Take 1 tablet (5 mg total) by mouth 3 (three) times daily as needed for muscle spasms. 09/06/11  Yes Corwin Levins, MD  levothyroxine (SYNTHROID, LEVOTHROID) 100 MCG tablet Take 1 tablet (100 mcg total) by mouth daily. 09/06/11 09/05/12 Yes Corwin Levins, MD  losartan (COZAAR) 50 MG tablet Take 1 tablet (50 mg total) by mouth daily. 09/06/11  Yes Corwin Levins, MD  Magnesium 250 MG TABS Take by mouth daily.     Yes Historical Provider, MD  meloxicam (MOBIC) 15 MG tablet Take 1 tablet (15 mg total) by mouth daily as needed for pain. 09/06/11  Yes Corwin Levins, MD  vitamin C (ASCORBIC ACID) 500 MG tablet 1/2 by mouth once daily    Yes Historical Provider, MD  ciprofloxacin (CIPRO) 500 MG tablet Take 1 tablet (500 mg total) by mouth 2 (two) times daily. 12/06/11 12/16/11  Ethelda Chick, MD  Allergies  Allergen Reactions  . Sulfonamide Derivatives     REACTION: caused heart to stop as child    History   Social History  . Marital Status: Widowed    Spouse Name: N/A    Number of Children: N/A  . Years of Education: N/A   Occupational History  . retired Engineer, water   Social History Main Topics  . Smoking status: Never Smoker   . Smokeless tobacco: Never Used  . Alcohol Use: No  . Drug Use: No  . Sexually Active: Not on file   Other Topics Concern  . Not on file   Social History Narrative   Lives alone in apt.    Family History  Problem  Relation Age of Onset  . Heart disease Mother   . Goiter Mother     mother had i-131 rx for a benign goiter  . Heart disease Father   . Stroke Father   . Bipolar disorder Sister   . Heart attack Brother   . Bipolar disorder Brother   . Seizures Other     Strong family history of seizures, 2 brothers and 2 sisters        Objective:   Physical Exam  Nursing note and vitals reviewed. Constitutional: She is oriented to person, place, and time. She appears well-developed and well-nourished.  Eyes: Conjunctivae are normal. Pupils are equal, round, and reactive to light.  Neck: Normal range of motion. Neck supple.  Cardiovascular: Normal rate, regular rhythm, normal heart sounds and intact distal pulses.   Pulmonary/Chest: Effort normal and breath sounds normal.  Abdominal: Soft. Bowel sounds are normal. She exhibits no distension and no mass. There is no tenderness. There is no rebound and no guarding.  Genitourinary: Vagina normal and uterus normal. Guaiac negative stool. No vaginal discharge found.       Good rectal tone.  Neurological: She is alert and oriented to person, place, and time. No cranial nerve deficit. She exhibits normal muscle tone.  Skin: Skin is warm and dry.  Psychiatric: She has a normal mood and affect. Her behavior is normal. Judgment and thought content normal.    Results for orders placed in visit on 12/06/11  POCT URINALYSIS DIPSTICK      Component Value Range   Color, UA yellow/green     Clarity, UA turbid     Glucose, UA negative     Bilirubin, UA small     Ketones, UA 15     Spec Grav, UA 1.025     Blood, UA moderate     pH, UA 8.5     Protein, UA >=300     Urobilinogen, UA 1.0     Nitrite, UA negative     Leukocytes, UA large (3+)    POCT UA - MICROSCOPIC ONLY      Component Value Range   WBC, Ur, HPF, POC TNTC     RBC, urine, microscopic negative     Bacteria, U Microscopic 3+     Mucus, UA negative     Epithelial cells, urine per micros  negative     Crystals, Ur, HPF, POC negative     Casts, Ur, LPF, POC negative     Yeast, UA negative    POCT WET PREP WITH KOH      Component Value Range   Trichomonas, UA Negative     Clue Cells Wet Prep HPF POC negative     Epithelial Wet Prep HPF POC 1-2  Yeast Wet Prep HPF POC negative     Bacteria Wet Prep HPF POC trace     RBC Wet Prep HPF POC negative     WBC Wet Prep HPF POC 1-2     KOH Prep POC Negative    IFOBT (OCCULT BLOOD)      Component Value Range   IFOBT Negative         Assessment & Plan:   1. Dysuria  POCT urinalysis dipstick, POCT UA - Microscopic Only, POCT Wet Prep with KOH, Urine culture  2. Vaginal Discharge  POCT Wet Prep with KOH, Urine culture  3. Colon cancer screening  IFOBT POC (occult bld, rslt in office)    1.  Dysuria:  New. Send urine culture.  Urine grossly abnormal.  No vaginal source of symptoms.  Treat empirically with Cipro 500mg  bid x 10 days.  RTC for development of fever, vomiting, flank pain. RTC in 2 weeks for repeat u/a to confirm resolution of symptoms.  Gross abnormality of urine suggests vaginal fistula but benign vaginal exam.  No suggestion of fecal incontinence by history or exam.  If no improvement in urine at follow-up visit, refer to urology. 2.  Vaginal Discharge: New. Pt reports vaginal discharge but benign vaginal exam.  Not sexually active.  Negative wet prep.  Feel current discharge/sediment from urinary source.   3. Colon Cancer screening:  Hemossure negative.

## 2011-12-08 LAB — URINE CULTURE

## 2011-12-13 ENCOUNTER — Telehealth: Payer: Self-pay

## 2011-12-13 NOTE — Telephone Encounter (Signed)
Faxed most recent office notes and labs to Dr. Ebony Hail office at [facility] per patient request for upcoming appt.

## 2011-12-17 ENCOUNTER — Ambulatory Visit (INDEPENDENT_AMBULATORY_CARE_PROVIDER_SITE_OTHER): Payer: Medicare Other | Admitting: Family Medicine

## 2011-12-17 VITALS — BP 128/80 | HR 95 | Temp 97.8°F | Resp 18 | Ht 68.75 in | Wt 159.0 lb

## 2011-12-17 DIAGNOSIS — N3090 Cystitis, unspecified without hematuria: Secondary | ICD-10-CM

## 2011-12-17 DIAGNOSIS — R300 Dysuria: Secondary | ICD-10-CM

## 2011-12-17 LAB — POCT UA - MICROSCOPIC ONLY
Crystals, Ur, HPF, POC: NEGATIVE
Yeast, UA: NEGATIVE

## 2011-12-17 LAB — POCT URINALYSIS DIPSTICK
Ketones, UA: NEGATIVE
Nitrite, UA: NEGATIVE
Protein, UA: 100
Urobilinogen, UA: 0.2

## 2011-12-17 MED ORDER — CIPROFLOXACIN HCL 500 MG PO TABS: 500.0000 mg | ORAL_TABLET | Freq: Two times a day (BID) | ORAL | Status: AC

## 2011-12-17 NOTE — Progress Notes (Signed)
Reviewed and agree.

## 2011-12-17 NOTE — Progress Notes (Signed)
  Subjective:    Patient ID: Tammy Duke, female    DOB: 1940/07/12, 71 y.o.   MRN: [id number]  HPI  Pt was seen by Dr. Katrinka Blazing 11d prior. There was concern for a vesical fistula as pt has been having large chunks ( up to 1 cm) of fecal like material in her urine.  Pt was treated w/ 10d of cipro - urine culture showed many bacteria but pelvic exam inc vaginal exam was nml and did not show any signs of a vistula or vag d/c.   Pt states that her urine appearance has improved but she is still seeing chunks of brown solid material in the cup or toilet bowel after urinating.  This are painful to pass when voiding. She feels certain that they are not coming from her vagina or rectum. She wears panty liners and does not have any vaginal discharge or leakage nor bowel prob.  She has never had any abdominal or vaginal surgeries.     Review of Systems  Constitutional: Negative for fever, chills and diaphoresis.  Gastrointestinal: Negative for nausea, vomiting, abdominal pain, diarrhea, constipation and rectal pain.  Genitourinary: Negative for dysuria, urgency, flank pain, vaginal bleeding, vaginal discharge, difficulty urinating, vaginal pain and pelvic pain.  Musculoskeletal: Negative for back pain.       Objective:   Physical Exam  Constitutional: She is oriented to person, place, and time. She appears well-developed and well-nourished. No distress.  HENT:  Head: Normocephalic and atraumatic.  Eyes: Conjunctivae normal are normal. No scleral icterus.  Cardiovascular: Normal rate, regular rhythm and normal heart sounds.   Pulmonary/Chest: Effort normal and breath sounds normal.  Abdominal: Soft. Bowel sounds are normal. She exhibits no distension and no mass. There is no tenderness.  Neurological: She is alert and oriented to person, place, and time.  Skin: Skin is warm and dry. She is not diaphoretic.  Psychiatric: She has a normal mood and affect.          Results for orders placed  in visit on 12/17/11  POCT UA - MICROSCOPIC ONLY      Component Value Range   WBC, Ur, HPF, POC TNTC     RBC, urine, microscopic 10-12     Bacteria, U Microscopic 2+     Mucus, UA positive     Epithelial cells, urine per micros 1-3     Crystals, Ur, HPF, POC neg     Casts, Ur, LPF, POC neg     Yeast, UA neg    POCT URINALYSIS DIPSTICK      Component Value Range   Color, UA amber     Clarity, UA cloudy     Glucose, UA neg     Bilirubin, UA small     Ketones, UA neg     Spec Grav, UA 1.015     Blood, UA moderate     pH, UA 7.5     Protein, UA 100     Urobilinogen, UA 0.2     Nitrite, UA neg     Leukocytes, UA large (3+)      Assessment & Plan:  1.  Urinary abnormality - send urine for clx again since last showed to many bacteria to identify/isolate. Restart cipro - since can cover some GI flora as well.  Refer to urology for further eval since hx still has concern for fistula.  Will send note to PCP as FYI per pt req.

## 2011-12-19 LAB — URINE CULTURE: Colony Count: NO GROWTH

## 2012-01-07 ENCOUNTER — Encounter (INDEPENDENT_AMBULATORY_CARE_PROVIDER_SITE_OTHER): Payer: Self-pay | Admitting: General Surgery

## 2012-01-08 ENCOUNTER — Telehealth: Payer: Self-pay | Admitting: Internal Medicine

## 2012-01-08 NOTE — Telephone Encounter (Signed)
Spoke with patient and gave her Dr. Brodie's recommendations. 

## 2012-01-08 NOTE — Telephone Encounter (Signed)
Spoke with patient and she reports she saw Dr. McDermit her urologist yesterday d/t recurrent UTI's. He did a CT scan and her "colon is emptying into her bladder" and he recommends she have surgery. He set her up to see Dr. Axel Filler at [facility]. She has an appointment on Thursday. She wants to know who Dr. Juanda Chance would recommend for surgery. She is concerned because she has never had surgery and wants someone who has experience with her type of surgery. Please, advise.

## 2012-01-08 NOTE — Telephone Encounter (Signed)
I don't know Dr Derrell Lolling, he may be a new surgeon with [facility]. General surgeons repai colo-vesicular fistulas. She may want to ask if there is a Company secretary at [facility] who has experience if fixing the fistulas. Dr T.Martin specializes in colorectal surgery.

## 2012-01-10 ENCOUNTER — Ambulatory Visit (INDEPENDENT_AMBULATORY_CARE_PROVIDER_SITE_OTHER): Payer: Medicare Other | Admitting: Surgery

## 2012-01-10 ENCOUNTER — Telehealth: Payer: Self-pay | Admitting: Internal Medicine

## 2012-01-10 ENCOUNTER — Ambulatory Visit (INDEPENDENT_AMBULATORY_CARE_PROVIDER_SITE_OTHER): Payer: Medicare Other | Admitting: General Surgery

## 2012-01-10 ENCOUNTER — Encounter (INDEPENDENT_AMBULATORY_CARE_PROVIDER_SITE_OTHER): Payer: Self-pay | Admitting: Surgery

## 2012-01-10 VITALS — BP 136/82 | HR 68 | Temp 98.0°F | Resp 18 | Ht 71.0 in | Wt 159.2 lb

## 2012-01-10 DIAGNOSIS — N321 Vesicointestinal fistula: Secondary | ICD-10-CM | POA: Insufficient documentation

## 2012-01-10 NOTE — Patient Instructions (Signed)
Thanks for your patience.  If you need further assistance after leaving the office, please call our office and speak with a [facility] nurse.  [phone].  If you want to leave a message for Dr. Addilyn Satterwhite, please call his office phone at [phone]. 

## 2012-01-10 NOTE — Telephone Encounter (Signed)
Spoke with patient and she saw Dr. Daphine Deutscher today and surgery is being scheduled. Wants to thank Dr. Juanda Chance.

## 2012-01-10 NOTE — Progress Notes (Signed)
Chief Complaint:  Colovesical fistula  History of Present Illness:  Tammy Duke is an 71 y.o. 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  She has seen Dr. Sherron Monday and Dr. Lina Sar.  Her symptoms go back several months and it included pneumaturia as well as some feculent material in her urine. Although she denies any severe lower abdominal pain she has had problems with her bowel going back to when she saw Dr. Bonnee Quin and had a barium enema. 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  I have discussed partial colectomy with her including the possibility of a temporary ileostomy or colostomy. Hopefully we will be able to resect this segment of her colon and closed the defect in her bladder. I will schedule this with Dr. Sherron Monday after a good colon prep.  Past Medical History  Diagnosis Date  . GOITER, MULTINODULAR 05/04/2009  . HYPOTHYROIDISM 04/21/2009  . HYPERLIPIDEMIA 04/21/2009  . HYPERTENSION 04/21/2009  . ALLERGIC RHINITIS 04/21/2009  . SYNCOPE 01/02/2010  . SEIZURE DISORDER 04/21/2009  . FATIGUE 04/21/2009  . THYROID CANCER, HX OF 04/21/2009  . Osteoporosis 10/28/2011    Past Surgical History  Procedure Date  . S/p left radius fracture surgury 2001    Dr. Jerl Santos, Ortho.  . S/p thyroid surgury for cancer 1985  . Hx of right retenal detachment  Sept. 2005  . S/p right cataract with lens implant     Current Outpatient Prescriptions  Medication Sig Dispense Refill  . aspirin 81 MG EC tablet Take 81 mg by mouth daily.        . Calcium Carbonate-Vitamin D (CALCIUM-VITAMIN D) 600-200 MG-UNIT CAPS Take by mouth daily.        . Calcium Carbonate-Vitamin D (CALTRATE 600+D) 600-400 MG-UNIT per tablet Take 1 tablet by mouth 2 (two) times daily.        . carbamazepine  (TEGRETOL) 200 MG tablet Take 1 and 1/2 by mouth twice a day and 1 by mouth at lunch and dinner.  450 tablet  3  . cyclobenzaprine (FLEXERIL) 5 MG tablet Take 1 tablet (5 mg total) by mouth 3 (three) times daily as needed for muscle spasms.  90 tablet  2  . levothyroxine (SYNTHROID, LEVOTHROID) 100 MCG tablet Take 1 tablet (100 mcg total) by mouth daily.  90 tablet  3  . losartan (COZAAR) 50 MG tablet Take 1 tablet (50 mg total) by mouth daily.  90 tablet  3  . Magnesium 250 MG TABS Take by mouth daily.        . meloxicam (MOBIC) 15 MG tablet Take 1 tablet (15 mg total) by mouth daily as needed for pain.  90 tablet  3  . vitamin C (ASCORBIC ACID) 500 MG tablet 1/2 by mouth once daily        Erythromycin and Sulfonamide derivatives Family History  Problem Relation Age of Onset  . Heart disease Mother   . Goiter Mother     mother had i-131 rx for a benign goiter  . Heart disease Father   . Stroke Father   . Bipolar disorder Sister   . Heart attack Brother   . Bipolar disorder Brother   . Seizures Other     Strong family history of seizures, 2 brothers and 2 sisters   Social History:  reports that she has never smoked. She has never used smokeless tobacco. She reports that she does not drink alcohol or use illicit drugs.   REVIEW OF SYSTEMS - PERTINENT POSITIVES ONLY: Patient denies DVT or history of any prior pelvic surgery. She's not a free bleeder but takes baby aspirin fives. 5 days before surgery.  Physical Exam:   Blood pressure 136/82, pulse 68, temperature 98 F (36.7 C), temperature source Temporal, resp. rate 18, height 5\' 11"  (1.803 m), weight 159 lb 3.2 oz (72.213 kg). Body mass index is 22.20 kg/(m^2).  Gen:  WDWN white female NAD  Neurological: Alert and oriented to person, place, and time. Motor and sensory function is grossly intact  Head: Normocephalic and atraumatic.  Eyes: Conjunctivae are normal. Pupils are equal, round, and reactive to light. No scleral icterus.   Neck: Normal range of motion. Neck supple. No tracheal deviation or thyromegaly present.  Cardiovascular:  SR without murmurs or gallops.  No carotid bruits Respiratory: Effort normal.  No respiratory distress. No chest wall tenderness. Breath sounds normal.  No wheezes, rales or rhonchi.  Abdomen:  nontender GU: Musculoskeletal: Normal range of motion. Extremities are nontender. No cyanosis, edema or clubbing noted Lymphadenopathy: No cervical, preauricular, postauricular or axillary adenopathy is present Skin: Skin is warm and dry. No rash noted. No diaphoresis. No erythema. No pallor. Pscyh: Normal mood and affect. Behavior is normal. Judgment and thought content normal.   LABORATORY RESULTS: No results found for this or any previous visit (from the past 48 hour(s)).  RADIOLOGY RESULTS: No results found.  Problem List: Patient Active Problem List  Diagnosis  . GOITER, MULTINODULAR  . HYPOTHYROIDISM  . HYPERLIPIDEMIA  . HYPERTENSION  . ALLERGIC RHINITIS  . SYNCOPE  . SEIZURE DISORDER  . FATIGUE  . Preventative health care  . Menopausal syndrome  . Hypocalcemia  . Osteoporosis  . colovesical fistula    Assessment & Plan: History and CT findings to suggest colovesical fistula. Plan lap assisted sigmoid mobilization and takedown of colovesical fistula and primary anastomosis. Patient is aware of risk of partial or temporary colostomy.    Matt B. Daphine Deutscher, MD, [facility] [phone] beeper [phone]  01/10/2012 9:25 AM

## 2012-01-15 ENCOUNTER — Encounter (INDEPENDENT_AMBULATORY_CARE_PROVIDER_SITE_OTHER): Payer: Self-pay

## 2012-01-16 ENCOUNTER — Ambulatory Visit (INDEPENDENT_AMBULATORY_CARE_PROVIDER_SITE_OTHER): Payer: Medicare Other | Admitting: *Deleted

## 2012-01-16 DIAGNOSIS — Z23 Encounter for immunization: Secondary | ICD-10-CM

## 2012-01-18 ENCOUNTER — Other Ambulatory Visit: Payer: Self-pay | Admitting: Urology

## 2012-01-21 ENCOUNTER — Encounter (HOSPITAL_COMMUNITY): Payer: Self-pay | Admitting: Pharmacy Technician

## 2012-01-21 ENCOUNTER — Encounter: Payer: Self-pay | Admitting: *Deleted

## 2012-01-21 DIAGNOSIS — N390 Urinary tract infection, site not specified: Secondary | ICD-10-CM | POA: Insufficient documentation

## 2012-01-28 ENCOUNTER — Ambulatory Visit (HOSPITAL_COMMUNITY)
Admission: RE | Admit: 2012-01-28 | Discharge: 2012-01-28 | Disposition: A | Discharge status: Home / Self Care | Payer: Medicare Other | Source: Ambulatory Visit | Attending: Surgery | Admitting: Surgery

## 2012-01-28 ENCOUNTER — Encounter (HOSPITAL_COMMUNITY)
Admission: RE | Admit: 2012-01-28 | Discharge: 2012-01-28 | Disposition: A | Discharge status: Home / Self Care | Payer: Medicare Other | Source: Ambulatory Visit | Attending: Surgery | Admitting: Surgery

## 2012-01-28 ENCOUNTER — Encounter (HOSPITAL_COMMUNITY): Payer: Self-pay

## 2012-01-28 DIAGNOSIS — Z01818 Encounter for other preprocedural examination: Secondary | ICD-10-CM | POA: Insufficient documentation

## 2012-01-28 DIAGNOSIS — Z01812 Encounter for preprocedural laboratory examination: Secondary | ICD-10-CM | POA: Insufficient documentation

## 2012-01-28 LAB — CBC
Hemoglobin: 12.4 g/dL (ref 12.0–15.0)
MCH: 32.8 pg (ref 26.0–34.0)
Platelets: 273 10*3/uL (ref 150–400)
RBC: 3.78 MIL/uL — ABNORMAL LOW (ref 3.87–5.11)
WBC: 5.3 10*3/uL (ref 4.0–10.5)

## 2012-01-28 LAB — BASIC METABOLIC PANEL
CO2: 27 mEq/L (ref 19–32)
Chloride: 89 mEq/L — ABNORMAL LOW (ref 96–112)
Glucose, Bld: 99 mg/dL (ref 70–99)
Sodium: 124 mEq/L — ABNORMAL LOW (ref 135–145)

## 2012-01-28 LAB — SURGICAL PCR SCREEN: MRSA, PCR: NEGATIVE

## 2012-01-28 MED ORDER — CHLORHEXIDINE GLUCONATE 4 % EX LIQD
1.0000 "application " | Freq: Once | CUTANEOUS | Status: DC
  Filled 2012-01-28: qty 15

## 2012-01-28 NOTE — Pre-Procedure Instructions (Signed)
20 Tammy Duke  01/28/2012   Your procedure is scheduled on:  Wednesday 01-30-2012  Report to [facility] at  AM.  845  Call this number if you have problems the morning of surgery: [phone]   Remember:   Do not drink liquids or  eat food:After Midnight. Tuesday night  Follow laxative instructions from Dr. Ermalene Searing office      Take these medicines the morning of surgery with A SIP OF WATER:  Synthroid and Tegretol   Do not wear jewelry, make-up or nail polish.  Do not wear lotions, powders, or perfumes. You may wear deodorant.  Do not shave 48 hours prior to surgery. Men may shave face and neck.  Do not bring valuables to the hospital.  Contacts, dentures or bridgework may not be worn into surgery.  Leave suitcase in the car. After surgery it may be brought to your room.  For patients admitted to the hospital, checkout time is 11:00 AM the day of discharge.   Patients discharged the day of surgery will not be allowed to drive home.  Name and phone number of your driver: Toney Reil  [phone]  See [facility] Preparing for surgery sheet.   Please read over the following fact sheets that you were given: MRSA Information

## 2012-01-29 ENCOUNTER — Telehealth (INDEPENDENT_AMBULATORY_CARE_PROVIDER_SITE_OTHER): Payer: Self-pay

## 2012-01-29 NOTE — Telephone Encounter (Signed)
The patient called in because she has been doing her bowel prep for surgery tomorrow.  Dr Daphine Deutscher is taking down a Colovesical  fistula.  Her prep has caused a hemorrhoid flare up.  She wants to know what she can do for it.  I advised warm tub baths, and only external treatment such as Tucks pads/ creams.  I advised she should not use anything that she has to insert.

## 2012-01-30 ENCOUNTER — Ambulatory Visit (HOSPITAL_COMMUNITY): Payer: Medicare Other | Admitting: Anesthesiology

## 2012-01-30 ENCOUNTER — Encounter (HOSPITAL_COMMUNITY): Payer: Self-pay | Admitting: Anesthesiology

## 2012-01-30 ENCOUNTER — Inpatient Hospital Stay (HOSPITAL_COMMUNITY)
Admission: RE | Admit: 2012-01-30 | Discharge: 2012-02-05 | DRG: 654 | Disposition: A | Discharge status: Home / Self Care | Payer: Medicare Other | Source: Ambulatory Visit | Attending: Surgery | Admitting: Surgery

## 2012-01-30 ENCOUNTER — Encounter (HOSPITAL_COMMUNITY): Payer: Self-pay | Admitting: *Deleted

## 2012-01-30 ENCOUNTER — Telehealth (INDEPENDENT_AMBULATORY_CARE_PROVIDER_SITE_OTHER): Payer: Self-pay | Admitting: General Surgery

## 2012-01-30 ENCOUNTER — Encounter (HOSPITAL_COMMUNITY)
Admission: RE | Disposition: A | Discharge status: Home / Self Care | Payer: Self-pay | Source: Ambulatory Visit | Attending: Surgery

## 2012-01-30 ENCOUNTER — Ambulatory Visit (INDEPENDENT_AMBULATORY_CARE_PROVIDER_SITE_OTHER): Payer: Medicare Other | Admitting: Surgery

## 2012-01-30 DIAGNOSIS — Z79899 Other long term (current) drug therapy: Secondary | ICD-10-CM

## 2012-01-30 DIAGNOSIS — K449 Diaphragmatic hernia without obstruction or gangrene: Secondary | ICD-10-CM | POA: Diagnosis present

## 2012-01-30 DIAGNOSIS — G40909 Epilepsy, unspecified, not intractable, without status epilepticus: Secondary | ICD-10-CM | POA: Diagnosis present

## 2012-01-30 DIAGNOSIS — M810 Age-related osteoporosis without current pathological fracture: Secondary | ICD-10-CM | POA: Diagnosis present

## 2012-01-30 DIAGNOSIS — N390 Urinary tract infection, site not specified: Secondary | ICD-10-CM | POA: Diagnosis present

## 2012-01-30 DIAGNOSIS — I10 Essential (primary) hypertension: Secondary | ICD-10-CM | POA: Diagnosis present

## 2012-01-30 DIAGNOSIS — E785 Hyperlipidemia, unspecified: Secondary | ICD-10-CM | POA: Diagnosis present

## 2012-01-30 DIAGNOSIS — E039 Hypothyroidism, unspecified: Secondary | ICD-10-CM | POA: Diagnosis present

## 2012-01-30 DIAGNOSIS — E042 Nontoxic multinodular goiter: Secondary | ICD-10-CM | POA: Diagnosis present

## 2012-01-30 DIAGNOSIS — K5730 Diverticulosis of large intestine without perforation or abscess without bleeding: Secondary | ICD-10-CM | POA: Diagnosis present

## 2012-01-30 DIAGNOSIS — N321 Vesicointestinal fistula: Secondary | ICD-10-CM

## 2012-01-30 DIAGNOSIS — Z7982 Long term (current) use of aspirin: Secondary | ICD-10-CM

## 2012-01-30 HISTORY — PX: LAPAROSCOPIC SIGMOID COLECTOMY: SHX5928

## 2012-01-30 HISTORY — PX: VESICO-VAGINAL FISTULA REPAIR: SHX5129

## 2012-01-30 HISTORY — PX: TAKE DOWN OF INTESTINAL FISTULA: SHX6107

## 2012-01-30 LAB — CREATININE, SERUM: GFR calc Af Amer: >90 mL/min (ref >90)

## 2012-01-30 LAB — CBC
HCT: 29.3 % — ABNORMAL LOW (ref 36.0–46.0)
MCHC: 36.2 g/dL — ABNORMAL HIGH (ref 30.0–36.0)
MCV: 92.1 fL (ref 78.0–100.0)
RDW: 13.6 % (ref 11.5–15.5)

## 2012-01-30 SURGERY — COLECTOMY, SIGMOID, LAPAROSCOPIC
Anesthesia: General | Site: Abdomen | Wound class: Contaminated

## 2012-01-30 SURGERY — Surgical Case
Anesthesia: *Unknown

## 2012-01-30 MED ORDER — SODIUM CHLORIDE 0.9 % IJ SOLN
INTRAMUSCULAR | Status: DC | PRN
  Administered 2012-01-30: 50 mL via INTRAVENOUS

## 2012-01-30 MED ORDER — KCL IN DEXTROSE-NACL 20-5-0.45 MEQ/L-%-% IV SOLN
INTRAVENOUS | Status: DC
  Administered 2012-01-30 – 2012-02-01 (×4): via INTRAVENOUS
  Administered 2012-02-02: 100 mL/h via INTRAVENOUS
  Administered 2012-02-03 – 2012-02-05 (×5): via INTRAVENOUS
  Filled 2012-01-30 (×15): qty 1000

## 2012-01-30 MED ORDER — PROMETHAZINE HCL 25 MG/ML IJ SOLN: 6.2500 mg | INTRAMUSCULAR | Status: DC | PRN

## 2012-01-30 MED ORDER — CARBAMAZEPINE 200 MG PO TABS
200.0000 mg | ORAL_TABLET | Freq: Two times a day (BID) | ORAL | Status: DC
  Administered 2012-01-30 – 2012-02-04 (×11): 200 mg via ORAL
  Filled 2012-01-30 (×14): qty 1

## 2012-01-30 MED ORDER — ONDANSETRON HCL 4 MG/2ML IJ SOLN
4.0000 mg | INTRAMUSCULAR | Status: DC | PRN
  Administered 2012-01-30: 4 mg via INTRAVENOUS
  Filled 2012-01-30: qty 2

## 2012-01-30 MED ORDER — HYDROMORPHONE HCL PF 1 MG/ML IJ SOLN: 0.2500 mg | INTRAMUSCULAR | Status: DC | PRN

## 2012-01-30 MED ORDER — ACETAMINOPHEN 10 MG/ML IV SOLN
INTRAVENOUS | Status: DC | PRN
  Administered 2012-01-30: 1000 mg via INTRAVENOUS

## 2012-01-30 MED ORDER — LEVOTHYROXINE SODIUM 100 MCG PO TABS
100.0000 ug | ORAL_TABLET | Freq: Every day | ORAL | Status: DC
  Administered 2012-01-31 – 2012-02-05 (×6): 100 ug via ORAL
  Filled 2012-01-30 (×7): qty 1

## 2012-01-30 MED ORDER — ACETAMINOPHEN 10 MG/ML IV SOLN: 1000.0000 mg | Freq: Once | INTRAVENOUS | Status: DC | PRN

## 2012-01-30 MED ORDER — CARBAMAZEPINE 200 MG PO TABS: 200.0000 mg | ORAL_TABLET | Freq: Three times a day (TID) | ORAL | Status: DC

## 2012-01-30 MED ORDER — 0.9 % SODIUM CHLORIDE (POUR BTL) OPTIME
TOPICAL | Status: DC | PRN
  Administered 2012-01-30: 3000 mL

## 2012-01-30 MED ORDER — BUPIVACAINE-EPINEPHRINE 0.5% -1:200000 IJ SOLN
INTRAMUSCULAR | Status: AC
  Filled 2012-01-30: qty 1

## 2012-01-30 MED ORDER — MORPHINE SULFATE 2 MG/ML IJ SOLN
1.0000 mg | INTRAMUSCULAR | Status: DC | PRN
  Administered 2012-01-30: 1 mg via INTRAVENOUS
  Filled 2012-01-30: qty 1

## 2012-01-30 MED ORDER — PROPOFOL 10 MG/ML IV BOLUS
INTRAVENOUS | Status: DC | PRN
  Administered 2012-01-30: 150 mg via INTRAVENOUS

## 2012-01-30 MED ORDER — ACETAMINOPHEN 10 MG/ML IV SOLN
INTRAVENOUS | Status: AC
  Filled 2012-01-30: qty 100

## 2012-01-30 MED ORDER — OXYCODONE HCL 5 MG/5ML PO SOLN
5.0000 mg | Freq: Once | ORAL | Status: DC | PRN
  Filled 2012-01-30: qty 5

## 2012-01-30 MED ORDER — INDIGOTINDISULFONATE SODIUM 8 MG/ML IJ SOLN
INTRAMUSCULAR | Status: AC
  Filled 2012-01-30: qty 5

## 2012-01-30 MED ORDER — LACTATED RINGERS IV SOLN
INTRAVENOUS | Status: DC | PRN
  Administered 2012-01-30 (×3): via INTRAVENOUS

## 2012-01-30 MED ORDER — STERILE WATER FOR IRRIGATION IR SOLN
Status: DC | PRN
  Administered 2012-01-30: 14:00:00 via INTRAVESICAL

## 2012-01-30 MED ORDER — HEPARIN SODIUM (PORCINE) 5000 UNIT/ML IJ SOLN
5000.0000 [IU] | Freq: Once | INTRAMUSCULAR | Status: AC
  Administered 2012-01-30: 5000 [IU] via SUBCUTANEOUS
  Filled 2012-01-30: qty 1

## 2012-01-30 MED ORDER — HYDROMORPHONE HCL PF 1 MG/ML IJ SOLN
INTRAMUSCULAR | Status: DC | PRN
  Administered 2012-01-30 (×2): 1 mg via INTRAVENOUS

## 2012-01-30 MED ORDER — ACETAMINOPHEN 10 MG/ML IV SOLN
1000.0000 mg | Freq: Four times a day (QID) | INTRAVENOUS | Status: AC
  Administered 2012-01-30 – 2012-01-31 (×4): 1000 mg via INTRAVENOUS
  Filled 2012-01-30 (×6): qty 100

## 2012-01-30 MED ORDER — CEFOXITIN SODIUM-DEXTROSE 1-4 GM-% IV SOLR (PREMIX)
INTRAVENOUS | Status: AC
  Filled 2012-01-30: qty 100

## 2012-01-30 MED ORDER — CYCLOBENZAPRINE HCL 5 MG PO TABS
5.0000 mg | ORAL_TABLET | Freq: Three times a day (TID) | ORAL | Status: DC | PRN
  Filled 2012-01-30: qty 1

## 2012-01-30 MED ORDER — NEOSTIGMINE METHYLSULFATE 1 MG/ML IJ SOLN
INTRAMUSCULAR | Status: DC | PRN
  Administered 2012-01-30: 3 mg via INTRAVENOUS

## 2012-01-30 MED ORDER — ONDANSETRON HCL 4 MG/2ML IJ SOLN
INTRAMUSCULAR | Status: DC | PRN
  Administered 2012-01-30: 4 mg via INTRAVENOUS

## 2012-01-30 MED ORDER — MELOXICAM 15 MG PO TABS
15.0000 mg | ORAL_TABLET | Freq: Every day | ORAL | Status: DC | PRN
  Administered 2012-01-31: 15 mg via ORAL
  Filled 2012-01-30: qty 1

## 2012-01-30 MED ORDER — DEXTROSE 5 % IV SOLN
1.0000 g | Freq: Four times a day (QID) | INTRAVENOUS | Status: AC
  Administered 2012-01-30 – 2012-01-31 (×3): 1 g via INTRAVENOUS
  Filled 2012-01-30 (×3): qty 1

## 2012-01-30 MED ORDER — OXYCODONE HCL 5 MG PO TABS: 5.0000 mg | ORAL_TABLET | Freq: Once | ORAL | Status: DC | PRN

## 2012-01-30 MED ORDER — BUPIVACAINE LIPOSOME 1.3 % IJ SUSP
20.0000 mL | Freq: Once | INTRAMUSCULAR | Status: DC
  Filled 2012-01-30: qty 20

## 2012-01-30 MED ORDER — EPHEDRINE SULFATE 50 MG/ML IJ SOLN
INTRAMUSCULAR | Status: DC | PRN
  Administered 2012-01-30: 10 mg via INTRAVENOUS

## 2012-01-30 MED ORDER — BUPIVACAINE-EPINEPHRINE 0.25% -1:200000 IJ SOLN
INTRAMUSCULAR | Status: AC
  Filled 2012-01-30: qty 1

## 2012-01-30 MED ORDER — GLYCOPYRROLATE 0.2 MG/ML IJ SOLN
INTRAMUSCULAR | Status: DC | PRN
  Administered 2012-01-30: 0.4 mg via INTRAVENOUS

## 2012-01-30 MED ORDER — ASPIRIN EC 81 MG PO TBEC
81.0000 mg | DELAYED_RELEASE_TABLET | Freq: Every day | ORAL | Status: DC
  Administered 2012-01-30 – 2012-02-05 (×7): 81 mg via ORAL
  Filled 2012-01-30 (×9): qty 1

## 2012-01-30 MED ORDER — STERILE WATER FOR IRRIGATION IR SOLN
Status: DC | PRN
  Administered 2012-01-30: 1500 mL

## 2012-01-30 MED ORDER — OXYBUTYNIN CHLORIDE ER 10 MG PO TB24
10.0000 mg | ORAL_TABLET | Freq: Every day | ORAL | Status: DC
  Administered 2012-01-30 – 2012-02-04 (×6): 10 mg via ORAL
  Filled 2012-01-30 (×7): qty 1

## 2012-01-30 MED ORDER — CARBAMAZEPINE 200 MG PO TABS
300.0000 mg | ORAL_TABLET | Freq: Two times a day (BID) | ORAL | Status: DC
  Administered 2012-01-30 – 2012-02-05 (×12): 300 mg via ORAL
  Filled 2012-01-30 (×16): qty 1.5

## 2012-01-30 MED ORDER — MEPERIDINE HCL 50 MG/ML IJ SOLN: 6.2500 mg | INTRAMUSCULAR | Status: DC | PRN

## 2012-01-30 MED ORDER — SUCCINYLCHOLINE CHLORIDE 20 MG/ML IJ SOLN
INTRAMUSCULAR | Status: DC | PRN
  Administered 2012-01-30: 100 mg via INTRAVENOUS

## 2012-01-30 MED ORDER — MIDAZOLAM HCL 5 MG/5ML IJ SOLN
INTRAMUSCULAR | Status: DC | PRN
  Administered 2012-01-30: 2 mg via INTRAVENOUS

## 2012-01-30 MED ORDER — ROCURONIUM BROMIDE 100 MG/10ML IV SOLN
INTRAVENOUS | Status: DC | PRN
  Administered 2012-01-30: 20 mg via INTRAVENOUS
  Administered 2012-01-30: 50 mg via INTRAVENOUS
  Administered 2012-01-30: 20 mg via INTRAVENOUS
  Administered 2012-01-30: 10 mg via INTRAVENOUS
  Administered 2012-01-30: 20 mg via INTRAVENOUS

## 2012-01-30 MED ORDER — LIDOCAINE HCL 1 % IJ SOLN
INTRAMUSCULAR | Status: DC | PRN
  Administered 2012-01-30: 50 mg via INTRADERMAL

## 2012-01-30 MED ORDER — OXYCODONE-ACETAMINOPHEN 5-325 MG PO TABS: 1.0000 | ORAL_TABLET | ORAL | Status: DC | PRN

## 2012-01-30 MED ORDER — LOSARTAN POTASSIUM 50 MG PO TABS
50.0000 mg | ORAL_TABLET | Freq: Every day | ORAL | Status: DC
  Administered 2012-01-30 – 2012-02-05 (×7): 50 mg via ORAL
  Filled 2012-01-30 (×7): qty 1

## 2012-01-30 MED ORDER — BUPIVACAINE LIPOSOME 1.3 % IJ SUSP
INTRAMUSCULAR | Status: DC | PRN
  Administered 2012-01-30: 20 mL

## 2012-01-30 MED ORDER — HEPARIN SODIUM (PORCINE) 5000 UNIT/ML IJ SOLN
5000.0000 [IU] | Freq: Three times a day (TID) | INTRAMUSCULAR | Status: DC
  Administered 2012-01-30 – 2012-02-05 (×17): 5000 [IU] via SUBCUTANEOUS
  Filled 2012-01-30 (×20): qty 1

## 2012-01-30 MED ORDER — DEXTROSE 5 % IV SOLN
2.0000 g | INTRAVENOUS | Status: AC
  Administered 2012-01-30: 2 g via INTRAVENOUS
  Filled 2012-01-30: qty 2

## 2012-01-30 MED ORDER — FENTANYL CITRATE 0.05 MG/ML IJ SOLN
INTRAMUSCULAR | Status: DC | PRN
  Administered 2012-01-30: 50 ug via INTRAVENOUS
  Administered 2012-01-30: 100 ug via INTRAVENOUS
  Administered 2012-01-30: 150 ug via INTRAVENOUS
  Administered 2012-01-30 (×2): 100 ug via INTRAVENOUS

## 2012-01-30 SURGICAL SUPPLY — 98 items
ADH SKN CLS APL DERMABOND .7 (GAUZE/BANDAGES/DRESSINGS)
BAG URINE DRAINAGE (UROLOGICAL SUPPLIES) ×3 IMPLANT
BLADE HEX COATED 2.75 (ELECTRODE) ×3 IMPLANT
BLADE SURG 15 STRL LF DISP TIS (BLADE) ×2 IMPLANT
BLADE SURG 15 STRL SS (BLADE) ×3
BLADE SURG SZ10 CARB STEEL (BLADE) ×3 IMPLANT
CABLE HI FREQUENCY MONOPOLAR (ELECTROSURGICAL) ×1 IMPLANT
CANISTER SUCTION 1500CC (MISCELLANEOUS) ×2 IMPLANT
CATH BIL BAL PROB 5FR (CATHETERS) IMPLANT
CATH BIL BAL PROB 6FR (CATHETERS) IMPLANT
CATH EMB 4FR 80CM (CATHETERS) IMPLANT
CATH EMB 5FR 80CM (CATHETERS) IMPLANT
CATH FOLEY 2WAY  3CC  8FR (CATHETERS)
CATH FOLEY 2WAY  3CC 10FR (CATHETERS)
CATH FOLEY 2WAY 3CC 10FR (CATHETERS) IMPLANT
CATH FOLEY 2WAY 3CC 8FR (CATHETERS) IMPLANT
CATH FOLEY 2WAY SLVR  5CC 14FR (CATHETERS) ×1
CATH FOLEY 2WAY SLVR  5CC 16FR (CATHETERS)
CATH FOLEY 2WAY SLVR  5CC 18FR (CATHETERS) ×1
CATH FOLEY 2WAY SLVR 5CC 14FR (CATHETERS) ×2 IMPLANT
CATH FOLEY 2WAY SLVR 5CC 16FR (CATHETERS) ×2 IMPLANT
CATH FOLEY 2WAY SLVR 5CC 18FR (CATHETERS) IMPLANT
CELLS DAT CNTRL 66122 CELL SVR (MISCELLANEOUS) ×2 IMPLANT
CLOTH BEACON ORANGE TIMEOUT ST (SAFETY) ×3 IMPLANT
COVER MAYO STAND STRL (DRAPES) ×1 IMPLANT
DECANTER SPIKE VIAL GLASS SM (MISCELLANEOUS) ×2 IMPLANT
DERMABOND ADVANCED (GAUZE/BANDAGES/DRESSINGS)
DERMABOND ADVANCED .7 DNX12 (GAUZE/BANDAGES/DRESSINGS) IMPLANT
DEVICE CAPIO SUTURING (INSTRUMENTS)
DEVICE CAPIO SUTURING OPC (INSTRUMENTS) IMPLANT
DRAIN PENROSE 18X1/4 LTX STRL (WOUND CARE) ×1 IMPLANT
DRAPE LAPAROSCOPIC ABDOMINAL (DRAPES) ×1 IMPLANT
DRAPE LG THREE QUARTER DISP (DRAPES) ×3 IMPLANT
DRAPE SURG IRRIG POUCH 19X23 (DRAPES) ×1 IMPLANT
DRAPE WARM FLUID 44X44 (DRAPE) ×2 IMPLANT
GAUZE PACKING 2X5 YD STERILE (GAUZE/BANDAGES/DRESSINGS) ×6 IMPLANT
GAUZE SPONGE 2X2 8PLY STRL LF (GAUZE/BANDAGES/DRESSINGS) IMPLANT
GAUZE SPONGE 4X4 16PLY XRAY LF (GAUZE/BANDAGES/DRESSINGS) ×6 IMPLANT
GLOVE BIOGEL M STRL SZ7.5 (GLOVE) ×3 IMPLANT
GOWN STRL REIN XL XLG (GOWN DISPOSABLE) ×7 IMPLANT
HOLDER FOLEY CATH W/STRAP (MISCELLANEOUS) ×2 IMPLANT
KIT BASIN OR (CUSTOM PROCEDURE TRAY) ×3 IMPLANT
LEGGING LITHOTOMY PAIR STRL (DRAPES) ×1 IMPLANT
NDL MAYO 6 CRC TAPER PT (NEEDLE) ×2 IMPLANT
NEEDLE HYPO 22GX1.5 SAFETY (NEEDLE) IMPLANT
NEEDLE MAYO .5 CIRCLE (NEEDLE) IMPLANT
NEEDLE MAYO 6 CRC TAPER PT (NEEDLE) IMPLANT
PACK CYSTO (CUSTOM PROCEDURE TRAY) ×3 IMPLANT
PENCIL BUTTON HOLSTER BLD 10FT (ELECTRODE) ×3 IMPLANT
PLUG CATH AND CAP STER (CATHETERS) ×3 IMPLANT
RETRACTOR STAY HOOK 5MM (MISCELLANEOUS) IMPLANT
RETRACTOR WND ALEXIS 18 MED (MISCELLANEOUS) IMPLANT
RTRCTR WOUND ALEXIS 18CM MED (MISCELLANEOUS) ×3
SCISSORS LAP 5X35 DISP (ENDOMECHANICALS) ×1 IMPLANT
SET TUBE IRRIG SUCTION NO TIP (IRRIGATION / IRRIGATOR) ×1 IMPLANT
SHEET LAVH (DRAPES) ×3 IMPLANT
SPONGE GAUZE 2X2 STER 10/PKG (GAUZE/BANDAGES/DRESSINGS) ×1
SPONGE LAP 18X18 X RAY DECT (DISPOSABLE) ×2 IMPLANT
STAPLER CUT CVD 40MM BLUE (STAPLE) ×1 IMPLANT
STAPLER CUT RELOAD BLUE (STAPLE) ×1 IMPLANT
STAPLER VISISTAT 35W (STAPLE) ×1 IMPLANT
SUCTION POOLE TIP (SUCTIONS) ×1 IMPLANT
SUT CAPIO ETHIBPND (SUTURE) IMPLANT
SUT CAPIO POLYGLYCOLIC (SUTURE) IMPLANT
SUT ETHIBOND 0 (SUTURE) ×4 IMPLANT
SUT PDS AB 1 TP1 96 (SUTURE) ×2 IMPLANT
SUT PDS AB 4-0 SH 27 (SUTURE) ×1 IMPLANT
SUT SILK 2 0 (SUTURE) ×3
SUT SILK 2 0 SH (SUTURE) ×2 IMPLANT
SUT SILK 2 0 SH CR/8 (SUTURE) ×1 IMPLANT
SUT SILK 2-0 18XBRD TIE 12 (SUTURE) IMPLANT
SUT SILK 3 0 (SUTURE) ×3
SUT SILK 3 0 SH CR/8 (SUTURE) ×4 IMPLANT
SUT SILK 3-0 18XBRD TIE 12 (SUTURE) IMPLANT
SUT VIC AB 0 CT1 27 (SUTURE)
SUT VIC AB 0 CT1 27XBRD ANTBC (SUTURE) ×4 IMPLANT
SUT VIC AB 2-0 CT1 27 (SUTURE)
SUT VIC AB 2-0 CT1 27XBRD (SUTURE) ×4 IMPLANT
SUT VIC AB 2-0 SH 27 (SUTURE) ×18
SUT VIC AB 2-0 SH 27X BRD (SUTURE) ×16 IMPLANT
SUT VIC AB 3-0 PS2 18 (SUTURE)
SUT VIC AB 3-0 PS2 18XBRD (SUTURE) IMPLANT
SUT VIC AB 3-0 SH 27 (SUTURE)
SUT VIC AB 3-0 SH 27XBRD (SUTURE) ×6 IMPLANT
SUT VIC AB 4-0 RB1 27 (SUTURE)
SUT VIC AB 4-0 RB1 27XBRD (SUTURE) ×8 IMPLANT
SUT VIC AB 4-0 SH 18 (SUTURE) ×1 IMPLANT
SYRINGE 10CC LL (SYRINGE) ×4 IMPLANT
TAPE CLOTH SURG 4X10 WHT LF (GAUZE/BANDAGES/DRESSINGS) ×1 IMPLANT
TOWEL OR NON WOVEN STRL DISP B (DISPOSABLE) ×2 IMPLANT
TRAY FOLEY CATH 14FRSI W/METER (CATHETERS) ×1 IMPLANT
TRAY LAP CHOLE (CUSTOM PROCEDURE TRAY) ×1 IMPLANT
TROCAR BLADELESS OPT 5 75 (ENDOMECHANICALS) ×1 IMPLANT
TROCAR Z-THREAD FIOS 5X100MM (TROCAR) ×2 IMPLANT
TUBING INSUFFLATION 10FT LAP (TUBING) ×1 IMPLANT
WATER STERILE IRR 1500ML POUR (IV SOLUTION) ×3 IMPLANT
YANKAUER SUCT BULB TIP 10FT TU (MISCELLANEOUS) ×4 IMPLANT
YANKAUER SUCT BULB TIP NO VENT (SUCTIONS) ×1 IMPLANT

## 2012-01-30 NOTE — H&P (Signed)
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 I was suspect that she had a bowel bladder fistula.  She had a CT scan today and radiology really felt that she had diverticulitis with a diverticular abscess and a filling defect in late films is likely stool with a small spot of air possibly.   Review of systems: No change in bowel or neurologic status.   Urinalysis: Positive bacteria. Urine sent for culture. Her last culture was normal.    Past Medical History Problems  1. History of  Arthritis V13.4 2. History of  Cancer 199.1 3. History of  Hypertension 401.9 4. History of  Seizure  Surgical History Problems  1. History of  Arm Incision  Allergies Medication  1. Sulfa Drugs  Family History Problems  1. Fraternal history of  Cardiac Arrest 2. Sororal history of  Cardiac Arrest 3. Paternal history of  Death In The Family Father father passed @ age 67pancreatitis 4. Maternal history of  Death In The Family Mother mother passed @ age 73leukemia 5. Fraternal history of  Epilepsy And Recurrent Seizures V17.2 6. Fraternal history of  Epilepsy And Recurrent Seizures V17.2 7. Sororal history of  Epilepsy And Recurrent Seizures V17.2 8. Sororal history of  Epilepsy And Recurrent Seizures V17.2 9. Maternal history of  Leukemia V16.6 10. Fraternal history of  Nephrolithiasis  Social History Problems  1. Caffeine Use 1 drink daily 2. Marital History - Widowed 3. Occupation: Retired 4. History of  Tobacco Use 305.1 smoked less than 1/2 pack dailysmoked for 15 yearsquit smoking 13 years ago Denied  5. 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 Chronic Cystitis 595.2  Plan Health Maintenance (V70.0)  1. 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 I will speak to general surgery and we will proceed accordingly.   I spoke to Dr. Ezzard Standing. He or his team will see Ms. Janoski in the next week electively. Of course if she gets a fever, etc., she will call us and I will treat her for a urinary tract infection or assess her clinically. He asked me not to start her on any antibiotics that cause resistance.   I am going to send a copy of my note to [facility] and also to Dr. Dickie La her gastroenterologist.   CC: [facility]    After a thorough review of the management options for the patient's condition the patient  elected to proceed with surgical therapy as noted above. We have discussed the potential benefits and risks of the procedure, side effects of the proposed treatment, the likelihood of the patient achieving the goals of the procedure, and any potential problems that  might occur during the procedure or recuperation. Informed consent has been obtained.

## 2012-01-30 NOTE — Brief Op Note (Signed)
01/30/2012  2:57 PM  PATIENT:  Tammy Duke  71 y.o. female  PRE-OPERATIVE DIAGNOSIS:  colovesicle fistula  POST-OPERATIVE DIAGNOSIS:  colovesicle fistula  PROCEDURE:  Procedure(s) (LRB) with comments: LAPAROSCOPIC SIGMOID COLECTOMY (N/A) - Lap Assisted Sigmoid Colectomy with Takedown of Colovesicle Fistula TAKE DOWN OF INTESTINAL FISTULA (N/A) FISTULA REPAIR VESICO-VAGINAL (N/A) - Enterovesical Fistula Repair  SURGEON:  Surgeon(s) and Role: Panel 1:    * Valarie Merino, MD - Primary    * Atilano Ina, MD,FACS - Assisting  Panel 2:    * Martina Sinner, MD - Primary  PHYSICIAN ASSISTANT:   ASSISTANTS: Gaynelle Adu, MD, FACS   ANESTHESIA:   general  EBL:  Total I/O In: 2000 [I.V.:2000] Out: 300 [Urine:225; Blood:75]  BLOOD ADMINISTERED:none  DRAINS: none   LOCAL MEDICATIONS USED:  OTHER Exparel  SPECIMEN:  Source of Specimen:  sigmoid colon  DISPOSITION OF SPECIMEN:  PATHOLOGY  COUNTS:  YES  TOURNIQUET:  * No tourniquets in log *  DICTATION: 161096  PLAN OF CARE: Admit to inpatient   PATIENT DISPOSITION:  PACU - hemodynamically stable.   Delay start of Pharmacological VTE agent (>24hrs) due to surgical blood loss or risk of bleeding: no

## 2012-01-30 NOTE — Anesthesia Preprocedure Evaluation (Addendum)
Anesthesia Evaluation  Patient identified by MRN, date of birth, ID band Patient awake    Reviewed: Allergy & Precautions, H&P , NPO status , Patient's Chart, lab work & pertinent test results  Airway Mallampati: I TM Distance: >3 FB Neck ROM: Full    Dental  (+) Dental Advisory Given and Teeth Intact   Pulmonary  breath sounds clear to auscultation  Pulmonary exam normal       Cardiovascular hypertension, Pt. on medications - CAD Rhythm:Regular Rate:Normal  Echo 12/2009- Left ventricle: The cavity size was normal. Wall thickness was increased in a pattern of mild LVH. The estimated ejection fraction was 60%. Wall motion was normal; there were no regional wall motion abnormalities. Left ventricular diastolic function parameters were normal.     Neuro/Psych Seizures -, Well Controlled,     GI/Hepatic negative GI ROS, Neg liver ROS,   Endo/Other  Hypothyroidism   Renal/GU negative Renal ROS     Musculoskeletal negative musculoskeletal ROS (+)   Abdominal   Peds  Hematology negative hematology ROS (+)   Anesthesia Other Findings   Reproductive/Obstetrics                         Anesthesia Physical Anesthesia Plan  ASA: III  Anesthesia Plan: General   Post-op Pain Management:    Induction: Intravenous  Airway Management Planned: Oral ETT  Additional Equipment:   Intra-op Plan:   Post-operative Plan: Extubation in OR  Informed Consent: I have reviewed the patients History and Physical, chart, labs and discussed the procedure including the risks, benefits and alternatives for the proposed anesthesia with the patient or authorized representative who has indicated his/her understanding and acceptance.   Dental advisory given  Plan Discussed with: CRNA  Anesthesia Plan Comments:         Anesthesia Quick Evaluation

## 2012-01-30 NOTE — H&P (View-Only) (Signed)
Chief Complaint:  Colovesical fistula  History of Present Illness:  Tammy Duke is an 70 y.o. 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  She has seen Dr. MacDiarmid and Dr. Dora Brodie.  Her symptoms go back several months and it included pneumaturia as well as some feculent material in her urine. Although she denies any severe lower abdominal pain she has had problems with her bowel going back to when she saw Dr. Howard Weiner and had a barium enema. 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  I have discussed partial colectomy with her including the possibility of a temporary ileostomy or colostomy. Hopefully we will be able to resect this segment of her colon and closed the defect in her bladder. I will schedule this with Dr. MacDiarmid after a good colon prep.  Past Medical History  Diagnosis Date  . GOITER, MULTINODULAR 05/04/2009  . HYPOTHYROIDISM 04/21/2009  . HYPERLIPIDEMIA 04/21/2009  . HYPERTENSION 04/21/2009  . ALLERGIC RHINITIS 04/21/2009  . SYNCOPE 01/02/2010  . SEIZURE DISORDER 04/21/2009  . FATIGUE 04/21/2009  . THYROID CANCER, HX OF 04/21/2009  . Osteoporosis 10/28/2011    Past Surgical History  Procedure Date  . S/p left radius fracture surgury 2001    Dr. Dalldorf, Ortho.  . S/p thyroid surgury for cancer 1985  . Hx of right retenal detachment  Sept. 2005  . S/p right cataract with lens implant     Current Outpatient Prescriptions  Medication Sig Dispense Refill  . aspirin 81 MG EC tablet Take 81 mg by mouth daily.        . Calcium Carbonate-Vitamin D (CALCIUM-VITAMIN D) 600-200 MG-UNIT CAPS Take by mouth daily.        . Calcium Carbonate-Vitamin D (CALTRATE 600+D) 600-400 MG-UNIT per tablet Take 1 tablet by mouth 2 (two) times daily.        . carbamazepine  (TEGRETOL) 200 MG tablet Take 1 and 1/2 by mouth twice a day and 1 by mouth at lunch and dinner.  450 tablet  3  . cyclobenzaprine (FLEXERIL) 5 MG tablet Take 1 tablet (5 mg total) by mouth 3 (three) times daily as needed for muscle spasms.  90 tablet  2  . levothyroxine (SYNTHROID, LEVOTHROID) 100 MCG tablet Take 1 tablet (100 mcg total) by mouth daily.  90 tablet  3  . losartan (COZAAR) 50 MG tablet Take 1 tablet (50 mg total) by mouth daily.  90 tablet  3  . Magnesium 250 MG TABS Take by mouth daily.        . meloxicam (MOBIC) 15 MG tablet Take 1 tablet (15 mg total) by mouth daily as needed for pain.  90 tablet  3  . vitamin C (ASCORBIC ACID) 500 MG tablet 1/2 by mouth once daily        Erythromycin and Sulfonamide derivatives Family History  Problem Relation Age of Onset  . Heart disease Mother   . Goiter Mother     mother had i-131 rx for a benign goiter  . Heart disease Father   . Stroke Father   . Bipolar disorder Sister   . Heart attack Brother   . Bipolar disorder Brother   . Seizures Other     Strong family history of seizures, 2 brothers and 2 sisters   Social History:     reports that she has never smoked. She has never used smokeless tobacco. She reports that she does not drink alcohol or use illicit drugs.   REVIEW OF SYSTEMS - PERTINENT POSITIVES ONLY: Patient denies DVT or history of any prior pelvic surgery. She's not a free bleeder but takes baby aspirin fives. 5 days before surgery.  Physical Exam:   Blood pressure 136/82, pulse 68, temperature 98 F (36.7 C), temperature source Temporal, resp. rate 18, height 5' 11" (1.803 m), weight 159 lb 3.2 oz (72.213 kg). Body mass index is 22.20 kg/(m^2).  Gen:  WDWN white female NAD  Neurological: Alert and oriented to person, place, and time. Motor and sensory function is grossly intact  Head: Normocephalic and atraumatic.  Eyes: Conjunctivae are normal. Pupils are equal, round, and reactive to light. No scleral icterus.   Neck: Normal range of motion. Neck supple. No tracheal deviation or thyromegaly present.  Cardiovascular:  SR without murmurs or gallops.  No carotid bruits Respiratory: Effort normal.  No respiratory distress. No chest wall tenderness. Breath sounds normal.  No wheezes, rales or rhonchi.  Abdomen:  nontender GU: Musculoskeletal: Normal range of motion. Extremities are nontender. No cyanosis, edema or clubbing noted Lymphadenopathy: No cervical, preauricular, postauricular or axillary adenopathy is present Skin: Skin is warm and dry. No rash noted. No diaphoresis. No erythema. No pallor. Pscyh: Normal mood and affect. Behavior is normal. Judgment and thought content normal.   LABORATORY RESULTS: No results found for this or any previous visit (from the past 48 hour(s)).  RADIOLOGY RESULTS: No results found.  Problem List: Patient Active Problem List  Diagnosis  . GOITER, MULTINODULAR  . HYPOTHYROIDISM  . HYPERLIPIDEMIA  . HYPERTENSION  . ALLERGIC RHINITIS  . SYNCOPE  . SEIZURE DISORDER  . FATIGUE  . Preventative health care  . Menopausal syndrome  . Hypocalcemia  . Osteoporosis  . colovesical fistula    Assessment & Plan: History and CT findings to suggest colovesical fistula. Plan lap assisted sigmoid mobilization and takedown of colovesical fistula and primary anastomosis. Patient is aware of risk of partial or temporary colostomy.    Matt B. Canden Cieslinski, MD, FACS  [facility] [phone] beeper [phone]  01/10/2012 9:25 AM     

## 2012-01-30 NOTE — Op Note (Signed)
Preoperative diagnosis: Colonic bladder fistula Postoperative diagnosis: Colonic bladder fistula  Surgery: Repair of colonic bladder fistula and cystotomy Surgeon: Dr. Lorin Picket Mamadou Breon  Dr. Daphine Deutscher had resected the diverticular disease sigmoid colon away from the bladder and uterus. He felt he could identify the opening to the bladder with granulation tissue. I was asked to assist at this point  The patient had been given preoperative antibiotics  I agreed that the patient appeared to have a small opening with granulation tissue on the left dome of the bladder just ventral to the fallopian tube and her uterus. There is no other associated inflammation.  The opening could not be cannulated easily.  Under sterile technique an 19 French Foley catheter was reinserted and she was instilled with 250 mL to 350 mL of saline with indigo carmine. There was no leakage of blue dye from the opening or from any other aspect of the bladder. The bladder filled out nicely.  3 deep interrupted 2-0 Vicryl sutures were placed through the opening. They were tension-free. I was very pleased with the closure. Eventually Dr. Daphine Deutscher will place omentum over this area  The sutures were placed with the bladder emptied to decrease any tension.  Foley catheter we left in place and the patient with a cystogram in 10-14 days.  It did not surprise me that the opening did not leak since it was likely very small and possibly transverse in the bladder wall obliquely.

## 2012-01-30 NOTE — Transfer of Care (Signed)
Immediate Anesthesia Transfer of Care Note  Patient: Tammy Duke  Procedure(s) Performed: Procedure(s) (LRB) with comments: LAPAROSCOPIC SIGMOID COLECTOMY (N/A) - Lap Assisted Sigmoid Colectomy with Takedown of Colovesicle Fistula TAKE DOWN OF INTESTINAL FISTULA (N/A) FISTULA REPAIR VESICO-VAGINAL (N/A) - Enterovesical Fistula Repair  Patient Location: PACU  Anesthesia Type:General  Level of Consciousness: awake and alert   Airway & Oxygen Therapy: Patient Spontanous Breathing and Patient connected to face mask oxygen  Post-op Assessment: Report given to PACU RN and Post -op Vital signs reviewed and stable  Post vital signs: Reviewed and stable  Complications: No apparent anesthesia complications

## 2012-01-30 NOTE — Telephone Encounter (Signed)
LMOM letting pt know that she has a PO appt w/ Dr. Daphine Deutscher on 1118 at 5:00.

## 2012-01-30 NOTE — Interval H&P Note (Signed)
History and Physical Interval Note:  01/30/2012 8:11 AM  Tammy Duke  has presented today for surgery, with the diagnosis of colovesicle fistula  The various methods of treatment have been discussed with the patient and family. After consideration of risks, benefits and other options for treatment, the patient has consented to  Procedure(s) (LRB) with comments: LAPAROSCOPIC SIGMOID COLECTOMY (N/A) - Lap Assisted Sigmoid Colectomy with Takedown of Colovesicle Fistula TAKE DOWN OF INTESTINAL FISTULA (N/A) FISTULA REPAIR VESICO-VAGINAL (N/A) - Enterovesical Fistula Repair as a surgical intervention .  The patient's history has been reviewed, patient examined, no change in status, stable for surgery.  I have reviewed the patient's chart and labs.  Questions were answered to the patient's satisfaction.     Woody Kronberg A

## 2012-01-30 NOTE — Anesthesia Postprocedure Evaluation (Signed)
Anesthesia Post Note  Patient: Tammy Duke  Procedure(s) Performed: Procedure(s) (LRB): LAPAROSCOPIC SIGMOID COLECTOMY (N/A) TAKE DOWN OF INTESTINAL FISTULA (N/A) FISTULA REPAIR VESICO-VAGINAL (N/A)  Anesthesia type: General  Patient location: PACU  Post pain: Pain level controlled  Post assessment: Post-op Vital signs reviewed  Last Vitals: BP 186/78  Pulse 72  Temp 36.6 C (Oral)  Resp 16  Ht 5\' 11"  (1.803 m)  Wt 160 lb (72.576 kg)  BMI 22.32 kg/m2  SpO2 100%  Post vital signs: Reviewed  Level of consciousness: sedated  Complications: No apparent anesthesia complications

## 2012-01-30 NOTE — Interval H&P Note (Signed)
History and Physical Interval Note:  01/30/2012 11:36 AM  Tammy Duke  has presented today for surgery, with the diagnosis of colovesicle fistula  The various methods of treatment have been discussed with the patient and family. After consideration of risks, benefits and other options for treatment, the patient has consented to  Procedure(s) (LRB) with comments: LAPAROSCOPIC SIGMOID COLECTOMY (N/A) - Lap Assisted Sigmoid Colectomy with Takedown of Colovesicle Fistula TAKE DOWN OF INTESTINAL FISTULA (N/A) FISTULA REPAIR VESICO-VAGINAL (N/A) - Enterovesical Fistula Repair as a surgical intervention .  The patient's history has been reviewed, patient examined, no change in status, stable for surgery.  I have reviewed the patient's chart and labs.  Questions were answered to the patient's satisfaction.     Gean Larose B

## 2012-01-31 ENCOUNTER — Encounter (HOSPITAL_COMMUNITY): Payer: Self-pay | Admitting: Surgery

## 2012-01-31 LAB — BASIC METABOLIC PANEL
BUN: 6 mg/dL (ref 6–23)
CO2: 28 mEq/L (ref 19–32)
GFR calc non Af Amer: 87 mL/min — ABNORMAL LOW (ref >90)
Glucose, Bld: 123 mg/dL — ABNORMAL HIGH (ref 70–99)
Potassium: 3.4 mEq/L — ABNORMAL LOW (ref 3.5–5.1)

## 2012-01-31 LAB — CBC
HCT: 28.6 % — ABNORMAL LOW (ref 36.0–46.0)
Hemoglobin: 10.2 g/dL — ABNORMAL LOW (ref 12.0–15.0)
MCHC: 35.7 g/dL (ref 30.0–36.0)

## 2012-01-31 NOTE — Progress Notes (Signed)
1 Day Post-Op Subjective: Patient reports pain control good.  Pt looks great.  Has ambulated and denies N/V.  Objective: Vital signs in last 24 hours: Temp:  [97.3 F (36.3 C)-98.7 F (37.1 C)] 97.8 F (36.6 C) (10/31 0629) Pulse Rate:  [60-94] 60  (10/31 0629) Resp:  [11-22] 18  (10/31 0629) BP: (129-192)/(68-87) 134/68 mmHg (10/31 0629) SpO2:  [100 %] 100 % (10/31 0629) Weight:  [72.576 kg (160 lb)] 72.576 kg (160 lb) (10/30 1629)  Intake/Output from previous day: 10/30 0701 - 10/31 0700 In: 3993.3 [I.V.:3993.3] Out: 2000 [Urine:1925; Blood:75] Intake/Output this shift:    Physical Exam:  General:alert, cooperative and no distress Breath sounds clear and unlabored GI: soft, non tender, ND Foley in place with clear/yellow urine SCDs in place   Lab Results:  Basename 01/31/12 0425 01/30/12 1529  HGB 10.2* 10.6*  HCT 28.6* 29.3*   BMET  Basename 01/31/12 0425 01/30/12 1529  NA 127* --  K 3.4* --  CL 94* --  CO2 28 --  GLUCOSE 123* --  BUN 6 --  CREATININE 0.69 0.70  CALCIUM 8.0* --   No results found for this basename: LABPT:3,INR:3 in the last 72 hours No results found for this basename: LABURIN:1 in the last 72 hours Results for orders placed during the hospital encounter of 01/28/12  SURGICAL PCR SCREEN     Status: Normal   Collection Time   01/28/12  9:35 AM      Component Value Range Status Comment   MRSA, PCR NEGATIVE  NEGATIVE Final    Staphylococcus aureus NEGATIVE  NEGATIVE Final     Studies/Results: No results found.  Assessment/Plan: 1 Day Post-Op Procedure(s) (LRB): LAPAROSCOPIC SIGMOID COLECTOMY (N/A) TAKE DOWN OF INTESTINAL FISTULA (N/A) FISTULA REPAIR VESICO-VAGINAL (N/A)  Doing well  Continue foley  Ambulate   I.S.   LOS: 1 day   YARBROUGH,Fawzi Melman G. 01/31/2012, 10:00 AM

## 2012-01-31 NOTE — Op Note (Signed)
[name], MELLER NO.:  [id number]  MEDICAL RECORD NO.:  [id number]  LOCATION:  1525                         FACILITY:  [facility]  PHYSICIAN:  Tammy Park. Daphine Deutscher, MD  DATE OF BIRTH:  25-Dec-1940  DATE OF PROCEDURE:  01/30/2012 DATE OF DISCHARGE:                              OPERATIVE REPORT   SURGEON:  Tammy Park. Daphine Deutscher, MD  ASSISTANT:  Mary Sella. Andrey Campanile, MD, FACS  Separate note dictated by Dr. Sherron Monday for closure of bladder fistula.  ANESTHESIA:  General endotracheal, positioned dorsal lithotomy.  DESCRIPTION OF PROCEDURE:  Tammy Duke is a 71 year old lady taken to room 11 at [facility], given general anesthesia, placed in dorsal lithotomy position.  The abdomen was prepped with chloroxylenol and draped sterilely.  Access to the abdomen was achieved to the right upper quadrant using a 0 degree 5 mm Optiview without difficulty and 2 other lower trocars were used, one in the midline below the umbilicus and the other one in the right lower quadrant and with those I was able to mobilize the descending colon and find the spot in the lower midline, particularly pull off to the left where it was fused.  I took down lot of the adhesions with Harmonic and then reached and then passed.  I then made a midline incision and put in the hand port [name]).  With that I was able to pull up the sigmoid colon and then using finger fracture, takedown of the colovesical proved to be a colon stuck tenaciously to the left tube and uterus.  Once this was fractured off, I was able to bring the bowel up and there was an obvious opening of fistula there where there had been an abscess cavity.  Eventually I cauterized the exuberant growth tissue down on the uterus and ovary and the other portion where we felt there was a fistula.  The bladder was closed with interrupted Vicryl.  That would be dictated by Dr. Sherron Monday.  The bowel was resected with the proximal and distal  application of the contour stapler blue load and was sent to Pathology for examination.  We then fashioned a side-to-end Roddie Mc type anastomosis with the back wall of 3-0 silk and then after appropriate enterotomies on the distal sigmoid and proximal rectosigmoid, an inner layer 4-0 PDS in a running locking fashion was done, carried anteriorly in a canal Mayo fashion to complete the inner layer anastomosis.  A second anterior layer interrupted 3-0 silks were used to complete the anastomosis.  There was really no mesenteric defect to close.  Everything was irrigated.  Sponge and needle counts was correct.  I applied some Seprafilm into the wound to prevent adhesions.  I changed my gloves and my gown.  We used fresh instruments and I then closed the peritoneal layer with a running 0 PDS. The anterior layer was closed with a running double-stranded PDS, tied in the middle separately.  Wound was again irrigated and further closed with 4-0 Vicryl and staples.  The 2 remaining flap trocar holes were closed with 4-0 Vicryl and with staples.  The patient tolerated the procedure well, was taken to recovery room in satisfactory condition.  Tammy Park Daphine Deutscher, MD     MBM/MEDQ  D:  01/30/2012  T:  01/31/2012  Job:  [id number]  cc:   Martina Sinner, MD Fax: [phone]

## 2012-01-31 NOTE — Progress Notes (Signed)
Patient ID: Tammy Duke, female   DOB: 1940/06/03, 71 y.o.   MRN: [id number] [facility] Progress Note:   1 Day Post-Op  Subjective: Mental status is clear Objective: Vital signs in last 24 hours: Temp:  [97.3 F (36.3 C)-98.7 F (37.1 C)] 97.8 F (36.6 C) (10/31 0629) Pulse Rate:  [60-98] 60  (10/31 0629) Resp:  [11-22] 18  (10/31 0629) BP: (129-192)/(68-87) 134/68 mmHg (10/31 0629) SpO2:  [98 %-100 %] 100 % (10/31 0629) Weight:  [160 lb (72.576 kg)] 160 lb (72.576 kg) (10/30 1629)  Intake/Output from previous day: 10/30 0701 - 10/31 0700 In: 3993.3 [I.V.:3993.3] Out: 2000 [Urine:1925; Blood:75] Intake/Output this shift:    Physical Exam: Work of breathing is normal.  Minimal pain in incision  Lab Results:  Results for orders placed during the hospital encounter of 01/30/12 (from the past 48 hour(s))  CBC     Status: Abnormal   Collection Time   01/30/12  3:29 PM      Component Value Range Comment   WBC 10.3  4.0 - 10.5 K/uL    RBC 3.18 (*) 3.87 - 5.11 MIL/uL    Hemoglobin 10.6 (*) 12.0 - 15.0 g/dL    HCT 40.9 (*) 81.1 - 46.0 %    MCV 92.1  78.0 - 100.0 fL    MCH 33.3  26.0 - 34.0 pg    MCHC 36.2 (*) 30.0 - 36.0 g/dL    RDW 91.4  78.2 - 95.6 %    Platelets 207  150 - 400 K/uL   CREATININE, SERUM     Status: Abnormal   Collection Time   01/30/12  3:29 PM      Component Value Range Comment   Creatinine, Ser 0.70  0.50 - 1.10 mg/dL    GFR calc non Af Amer 86 (*) >90 mL/min    GFR calc Af Amer >90  >90 mL/min   CBC     Status: Abnormal   Collection Time   01/31/12  4:25 AM      Component Value Range Comment   WBC 11.2 (*) 4.0 - 10.5 K/uL    RBC 3.08 (*) 3.87 - 5.11 MIL/uL    Hemoglobin 10.2 (*) 12.0 - 15.0 g/dL    HCT 21.3 (*) 08.6 - 46.0 %    MCV 92.9  78.0 - 100.0 fL    MCH 33.1  26.0 - 34.0 pg    MCHC 35.7  30.0 - 36.0 g/dL    RDW 57.8  46.9 - 62.9 %    Platelets 215  150 - 400 K/uL   BASIC METABOLIC PANEL     Status: Abnormal   Collection Time   01/31/12  4:25 AM      Component Value Range Comment   Sodium 127 (*) 135 - 145 mEq/L    Potassium 3.4 (*) 3.5 - 5.1 mEq/L    Chloride 94 (*) 96 - 112 mEq/L    CO2 28  19 - 32 mEq/L    Glucose, Bld 123 (*) 70 - 99 mg/dL    BUN 6  6 - 23 mg/dL    Creatinine, Ser 5.28  0.50 - 1.10 mg/dL    Calcium 8.0 (*) 8.4 - 10.5 mg/dL    GFR calc non Af Amer 87 (*) >90 mL/min    GFR calc Af Amer >90  >90 mL/min     Radiology/Results: No results found.  Anti-infectives: Anti-infectives     Start     Dose/Rate  Route Frequency Ordered Stop   01/30/12 1800   cefOXitin (MEFOXIN) 1 g in dextrose 5 % 50 mL IVPB        1 g 100 mL/hr over 30 Minutes Intravenous Every 6 hours 01/30/12 1628 01/31/12 1159   01/30/12 0830   cefOXitin (MEFOXIN) 2 g in dextrose 5 % 50 mL IVPB        2 g 100 mL/hr over 30 Minutes Intravenous On call to O.R. 01/30/12 0830 01/30/12 1151          Assessment/Plan: Problem List: Patient Active Problem List  Diagnosis  . GOITER, MULTINODULAR  . HYPOTHYROIDISM  . HYPERLIPIDEMIA  . HYPERTENSION  . ALLERGIC RHINITIS  . SYNCOPE  . SEIZURE DISORDER  . FATIGUE  . Preventative health care  . Menopausal syndrome  . Hypocalcemia  . Osteoporosis  . colovesical fistula  . Urinary tract infection, site not specified    Postop colectomy.  Will get up and walk 1 Day Post-Op    LOS: 1 day   Matt B. Daphine Deutscher, MD, [facility] [phone] beeper [phone]  01/31/2012 7:08 AM

## 2012-01-31 NOTE — Care Management Note (Signed)
    Page 1 of 1   01/31/2012     12:23:29 PM   CARE MANAGEMENT NOTE 01/31/2012  Patient:  Tammy Duke, Tammy Duke   Account Number:  [id number]  Date Initiated:  01/31/2012  Documentation initiated by:  Lorenda Ishihara  Subjective/Objective Assessment:   71 yo female admitted s/p colectomy for bladder fistula. PTA lived at home alone.     Action/Plan:   home when stable   Anticipated DC Date:  02/04/2012   Anticipated DC Plan:  HOME/SELF CARE      DC Planning Services  CM consult      Choice offered to / List presented to:             Status of service:  Completed, signed off Medicare Important Message given?   (If response is "NO", the following Medicare IM given date fields will be blank) Date Medicare IM given:   Date Additional Medicare IM given:    Discharge Disposition:  HOME/SELF CARE  Per UR Regulation:  Reviewed for med. necessity/level of care/duration of stay  If discussed at Long Length of Stay Meetings, dates discussed:    Comments:

## 2012-02-01 LAB — BASIC METABOLIC PANEL WITH GFR
BUN: 5 mg/dL — ABNORMAL LOW (ref 6–23)
CO2: 24 meq/L (ref 19–32)
Calcium: 8 mg/dL — ABNORMAL LOW (ref 8.4–10.5)
Chloride: 94 meq/L — ABNORMAL LOW (ref 96–112)
Creatinine, Ser: 0.51 mg/dL (ref 0.50–1.10)
GFR calc Af Amer: >90 mL/min
GFR calc non Af Amer: >90 mL/min
Glucose, Bld: 125 mg/dL — ABNORMAL HIGH (ref 70–99)
Potassium: 3.1 meq/L — ABNORMAL LOW (ref 3.5–5.1)
Sodium: 125 meq/L — ABNORMAL LOW (ref 135–145)

## 2012-02-01 LAB — CBC
HCT: 27.7 % — ABNORMAL LOW (ref 36.0–46.0)
Hemoglobin: 9.6 g/dL — ABNORMAL LOW (ref 12.0–15.0)
MCH: 32.3 pg (ref 26.0–34.0)
MCHC: 34.7 g/dL (ref 30.0–36.0)
MCV: 93.3 fL (ref 78.0–100.0)
Platelets: 177 10*3/uL (ref 150–400)
RBC: 2.97 MIL/uL — ABNORMAL LOW (ref 3.87–5.11)
RDW: 13.7 % (ref 11.5–15.5)
WBC: 12.1 10*3/uL — ABNORMAL HIGH (ref 4.0–10.5)

## 2012-02-01 NOTE — Progress Notes (Signed)
Catheter looks good Will see Monday Will arrange f/up

## 2012-02-01 NOTE — Progress Notes (Signed)
Patient ID: Tammy Duke, female   DOB: 1941/01/14, 71 y.o.   MRN: [id number] [facility] Progress Note:   2 Days Post-Op  Subjective: Mental status is clear.  No complaints.  No flatus Objective: Vital signs in last 24 hours: Temp:  [99 F (37.2 C)-100.6 F (38.1 C)] 99.7 F (37.6 C) (11/01 0602) Pulse Rate:  [91-96] 96  (11/01 0602) Resp:  [18] 18  (11/01 0602) BP: (148-152)/(79-85) 148/79 mmHg (11/01 0602) SpO2:  [97 %-100 %] 99 % (11/01 0602)  Intake/Output from previous day: 10/31 0701 - 11/01 0700 In: 2360 [I.V.:2360] Out: 2300 [Urine:2300] Intake/Output this shift:    Physical Exam: Work of breathing is normal.  Foley in place.  Abdominal pain is minimal.   Lab Results:  Results for orders placed during the hospital encounter of 01/30/12 (from the past 48 hour(s))  CBC     Status: Abnormal   Collection Time   01/30/12  3:29 PM      Component Value Range Comment   WBC 10.3  4.0 - 10.5 K/uL    RBC 3.18 (*) 3.87 - 5.11 MIL/uL    Hemoglobin 10.6 (*) 12.0 - 15.0 g/dL    HCT 14.7 (*) 82.9 - 46.0 %    MCV 92.1  78.0 - 100.0 fL    MCH 33.3  26.0 - 34.0 pg    MCHC 36.2 (*) 30.0 - 36.0 g/dL    RDW 56.2  13.0 - 86.5 %    Platelets 207  150 - 400 K/uL   CREATININE, SERUM     Status: Abnormal   Collection Time   01/30/12  3:29 PM      Component Value Range Comment   Creatinine, Ser 0.70  0.50 - 1.10 mg/dL    GFR calc non Af Amer 86 (*) >90 mL/min    GFR calc Af Amer >90  >90 mL/min   CBC     Status: Abnormal   Collection Time   01/31/12  4:25 AM      Component Value Range Comment   WBC 11.2 (*) 4.0 - 10.5 K/uL    RBC 3.08 (*) 3.87 - 5.11 MIL/uL    Hemoglobin 10.2 (*) 12.0 - 15.0 g/dL    HCT 78.4 (*) 69.6 - 46.0 %    MCV 92.9  78.0 - 100.0 fL    MCH 33.1  26.0 - 34.0 pg    MCHC 35.7  30.0 - 36.0 g/dL    RDW 29.5  28.4 - 13.2 %    Platelets 215  150 - 400 K/uL   BASIC METABOLIC PANEL     Status: Abnormal   Collection Time   01/31/12  4:25 AM   Component Value Range Comment   Sodium 127 (*) 135 - 145 mEq/L    Potassium 3.4 (*) 3.5 - 5.1 mEq/L    Chloride 94 (*) 96 - 112 mEq/L    CO2 28  19 - 32 mEq/L    Glucose, Bld 123 (*) 70 - 99 mg/dL    BUN 6  6 - 23 mg/dL    Creatinine, Ser 4.40  0.50 - 1.10 mg/dL    Calcium 8.0 (*) 8.4 - 10.5 mg/dL    GFR calc non Af Amer 87 (*) >90 mL/min    GFR calc Af Amer >90  >90 mL/min   CBC     Status: Abnormal   Collection Time   02/01/12  4:10 AM      Component Value  Range Comment   WBC 12.1 (*) 4.0 - 10.5 K/uL    RBC 2.97 (*) 3.87 - 5.11 MIL/uL    Hemoglobin 9.6 (*) 12.0 - 15.0 g/dL    HCT 40.9 (*) 81.1 - 46.0 %    MCV 93.3  78.0 - 100.0 fL    MCH 32.3  26.0 - 34.0 pg    MCHC 34.7  30.0 - 36.0 g/dL    RDW 91.4  78.2 - 95.6 %    Platelets 177  150 - 400 K/uL   BASIC METABOLIC PANEL     Status: Abnormal   Collection Time   02/01/12  4:10 AM      Component Value Range Comment   Sodium 125 (*) 135 - 145 mEq/L    Potassium 3.1 (*) 3.5 - 5.1 mEq/L    Chloride 94 (*) 96 - 112 mEq/L    CO2 24  19 - 32 mEq/L    Glucose, Bld 125 (*) 70 - 99 mg/dL    BUN 5 (*) 6 - 23 mg/dL    Creatinine, Ser 2.13  0.50 - 1.10 mg/dL    Calcium 8.0 (*) 8.4 - 10.5 mg/dL    GFR calc non Af Amer >90  >90 mL/min    GFR calc Af Amer >90  >90 mL/min     Radiology/Results: No results found.  Anti-infectives: Anti-infectives     Start     Dose/Rate Route Frequency Ordered Stop   01/30/12 1800   cefOXitin (MEFOXIN) 1 g in dextrose 5 % 50 mL IVPB        1 g 100 mL/hr over 30 Minutes Intravenous Every 6 hours 01/30/12 1628 01/31/12 0716   01/30/12 0830   cefOXitin (MEFOXIN) 2 g in dextrose 5 % 50 mL IVPB        2 g 100 mL/hr over 30 Minutes Intravenous On call to O.R. 01/30/12 0830 01/30/12 1151          Assessment/Plan: Problem List: Patient Active Problem List  Diagnosis  . GOITER, MULTINODULAR  . HYPOTHYROIDISM  . HYPERLIPIDEMIA  . HYPERTENSION  . ALLERGIC RHINITIS  . SYNCOPE  . SEIZURE DISORDER   . FATIGUE  . Preventative health care  . Menopausal syndrome  . Hypocalcemia  . Osteoporosis  . colovesical fistula  . Urinary tract infection, site not specified    Doing well so far.  Hold POs for now.  2 Days Post-Op    LOS: 2 days   Matt B. Daphine Deutscher, MD, [facility] [phone] beeper [phone]  02/01/2012 8:48 AM

## 2012-02-02 NOTE — Progress Notes (Signed)
Patient ID: Tammy Duke, female   DOB: 1940/06/28, 71 y.o.   MRN: [id number] [facility] Progress Note:   3 Days Post-Op  Subjective: Mental status is clear.  No flatus Objective: Vital signs in last 24 hours: Temp:  [98.2 F (36.8 C)-100.2 F (37.9 C)] 98.2 F (36.8 C) (11/02 0540) Pulse Rate:  [90-101] 90  (11/02 0540) Resp:  [18-20] 18  (11/02 0540) BP: (132-170)/(77-83) 132/77 mmHg (11/02 0540) SpO2:  [97 %-100 %] 99 % (11/02 0540)  Intake/Output from previous day: 11/01 0701 - 11/02 0700 In: 2386.7 [I.V.:2386.7] Out: 2350 [Urine:2350] Intake/Output this shift:    Physical Exam: Work of breathing is normal.  No abdominal complaints.  Foley in place  Lab Results:  Results for orders placed during the hospital encounter of 01/30/12 (from the past 48 hour(s))  CBC     Status: Abnormal   Collection Time   02/01/12  4:10 AM      Component Value Range Comment   WBC 12.1 (*) 4.0 - 10.5 K/uL    RBC 2.97 (*) 3.87 - 5.11 MIL/uL    Hemoglobin 9.6 (*) 12.0 - 15.0 g/dL    HCT 40.9 (*) 81.1 - 46.0 %    MCV 93.3  78.0 - 100.0 fL    MCH 32.3  26.0 - 34.0 pg    MCHC 34.7  30.0 - 36.0 g/dL    RDW 91.4  78.2 - 95.6 %    Platelets 177  150 - 400 K/uL   BASIC METABOLIC PANEL     Status: Abnormal   Collection Time   02/01/12  4:10 AM      Component Value Range Comment   Sodium 125 (*) 135 - 145 mEq/L    Potassium 3.1 (*) 3.5 - 5.1 mEq/L    Chloride 94 (*) 96 - 112 mEq/L    CO2 24  19 - 32 mEq/L    Glucose, Bld 125 (*) 70 - 99 mg/dL    BUN 5 (*) 6 - 23 mg/dL    Creatinine, Ser 2.13  0.50 - 1.10 mg/dL    Calcium 8.0 (*) 8.4 - 10.5 mg/dL    GFR calc non Af Amer >90  >90 mL/min    GFR calc Af Amer >90  >90 mL/min     Radiology/Results: No results found.  Anti-infectives: Anti-infectives     Start     Dose/Rate Route Frequency Ordered Stop   01/30/12 1800   cefOXitin (MEFOXIN) 1 g in dextrose 5 % 50 mL IVPB        1 g 100 mL/hr over 30 Minutes Intravenous  Every 6 hours 01/30/12 1628 01/31/12 0716   01/30/12 0830   cefOXitin (MEFOXIN) 2 g in dextrose 5 % 50 mL IVPB        2 g 100 mL/hr over 30 Minutes Intravenous On call to O.R. 01/30/12 0830 01/30/12 1151          Assessment/Plan: Problem List: Patient Active Problem List  Diagnosis  . GOITER, MULTINODULAR  . HYPOTHYROIDISM  . HYPERLIPIDEMIA  . HYPERTENSION  . ALLERGIC RHINITIS  . SYNCOPE  . SEIZURE DISORDER  . FATIGUE  . Preventative health care  . Menopausal syndrome  . Hypocalcemia  . Osteoporosis  . colovesical fistula  . Urinary tract infection, site not specified    Awaiting flatus before starting clear liquid diet.  3 Days Post-Op    LOS: 3 days   Matt B. Daphine Deutscher, MD, [facility] [phone] beeper [phone]  02/02/2012 9:57 AM

## 2012-02-03 MED ORDER — HYDROCODONE-ACETAMINOPHEN 10-325 MG PO TABS: 1.0000 | ORAL_TABLET | ORAL | Status: DC | PRN

## 2012-02-03 NOTE — Progress Notes (Signed)
General Surgery Note  LOS: 4 days  POD -  4 Days Post-Op Room 1525  Assessment/Plan: 1.  LAPAROSCOPIC SIGMOID COLECTOMY, TAKE DOWN OF INTESTINAL FISTULA, FISTULA REPAIR VESICO-VAGINAL - M. Daphine Deutscher - 2012/02/06  Has passed flatus and had small BM.  To start clr liquids.   2.  Hypertension 3.  Seizure disorder  On Tegretol  Subjective:  Doing well.  Has passed flatus and had small BM. Objective:   Filed Vitals:   02/03/12 0540  BP: 167/87  Pulse: 89  Temp: 98.9 F (37.2 C)  Resp: 18     Intake/Output from previous day:  11/02 0701 - 11/03 0700 In: 2400 [I.V.:2400] Out: 2450 [Urine:2450]  Intake/Output this shift:      Physical Exam:   General: WN older WF who is alert and oriented.    HEENT: Normal. Pupils equal. .   Lungs: Clear   Abdomen: mild distention, but BS present.   Wound: Clean.  Lab Results:    [facility] 02/01/12 0410  WBC 12.1*  HGB 9.6*  HCT 27.7*  PLT 177    BMET   Basename 02/01/12 0410  NA 125*  K 3.1*  CL 94*  CO2 24  GLUCOSE 125*  BUN 5*  CREATININE 0.51  CALCIUM 8.0*    PT/INR  No results found for this basename: LABPROT:2,INR:2 in the last 72 hours  ABG  No results found for this basename: PHART:2,PCO2:2,PO2:2,HCO3:2 in the last 72 hours   Studies/Results:  No results found.   Anti-infectives:   Anti-infectives     Start     Dose/Rate Route Frequency Ordered Stop   02-06-12 1800   cefOXitin (MEFOXIN) 1 g in dextrose 5 % 50 mL IVPB        1 g 100 mL/hr over 30 Minutes Intravenous Every 6 hours February 06, 2012 1628 01/31/12 0716   February 06, 2012 0830   cefOXitin (MEFOXIN) 2 g in dextrose 5 % 50 mL IVPB        2 g 100 mL/hr over 30 Minutes Intravenous On call to O.R. 2012/02/06 0830 06-Feb-2012 1151          Ovidio Kin, MD, FACS Pager: [phone],   [facility] Office: [phone] 02/03/2012

## 2012-02-04 NOTE — Progress Notes (Signed)
Looks great Urine clear No urgency Home on macrodantin and ditropan- RX given i will call and arrange post op cystogram Send home with leg and night bag

## 2012-02-04 NOTE — Progress Notes (Signed)
Patient ID: Tammy Duke, female   DOB: 1940/09/01, 71 y.o.   MRN: [id number] [facility] Progress Note:   5 Days Post-Op  Subjective: Mental status is clear Objective: Vital signs in last 24 hours: Temp:  [98.4 F (36.9 C)-99 F (37.2 C)] 98.9 F (37.2 C) (11/04 1030) Pulse Rate:  [82-93] 89  (11/04 1030) Resp:  [18-20] 20  (11/04 1030) BP: (164-173)/(76-90) 171/87 mmHg (11/04 1030) SpO2:  [98 %-100 %] 100 % (11/04 1030)  Intake/Output from previous day: 11/03 0701 - 11/04 0700 In: 3360 [P.O.:960; I.V.:2400] Out: 3000 [Urine:3000] Intake/Output this shift: Total I/O In: 600 [P.O.:600] Out: 951 [Urine:950; Stool:1]  Physical Exam: Work of breathing is normal.  No abdominal pain.    Lab Results:  No results found for this or any previous visit (from the past 48 hour(s)).  Radiology/Results: No results found.  Anti-infectives: Anti-infectives     Start     Dose/Rate Route Frequency Ordered Stop   01/30/12 1800   cefOXitin (MEFOXIN) 1 g in dextrose 5 % 50 mL IVPB        1 g 100 mL/hr over 30 Minutes Intravenous Every 6 hours 01/30/12 1628 01/31/12 0716   01/30/12 0830   cefOXitin (MEFOXIN) 2 g in dextrose 5 % 50 mL IVPB        2 g 100 mL/hr over 30 Minutes Intravenous On call to O.R. 01/30/12 0830 01/30/12 1151          Assessment/Plan: Problem List: Patient Active Problem List  Diagnosis  . GOITER, MULTINODULAR  . HYPOTHYROIDISM  . HYPERLIPIDEMIA  . HYPERTENSION  . ALLERGIC RHINITIS  . SYNCOPE  . SEIZURE DISORDER  . FATIGUE  . Preventative health care  . Menopausal syndrome  . Hypocalcemia  . Osteoporosis  . colovesical fistula  . Urinary tract infection, site not specified    Path negative ;  Hopeful discharge tomorrow 5 Days Post-Op    LOS: 5 days   Matt B. Daphine Deutscher, MD, [facility] [phone] beeper [phone]  02/04/2012 1:16 PM

## 2012-02-05 LAB — CBC WITH DIFFERENTIAL/PLATELET
Basophils Absolute: 0 10*3/uL (ref 0.0–0.1)
Basophils Relative: 0 % (ref 0–1)
Eosinophils Absolute: 0.2 10*3/uL (ref 0.0–0.7)
Eosinophils Relative: 3 % (ref 0–5)
HCT: 28.6 % — ABNORMAL LOW (ref 36.0–46.0)
Hemoglobin: 10.1 g/dL — ABNORMAL LOW (ref 12.0–15.0)
Lymphocytes Relative: 13 % (ref 12–46)
Lymphs Abs: 0.8 10*3/uL (ref 0.7–4.0)
MCH: 32.5 pg (ref 26.0–34.0)
MCHC: 35.3 g/dL (ref 30.0–36.0)
MCV: 92 fL (ref 78.0–100.0)
Monocytes Absolute: 0.5 10*3/uL (ref 0.1–1.0)
Monocytes Relative: 7 % (ref 3–12)
Neutro Abs: 4.9 10*3/uL (ref 1.7–7.7)
Neutrophils Relative %: 76 % (ref 43–77)
Platelets: 248 10*3/uL (ref 150–400)
RBC: 3.11 MIL/uL — ABNORMAL LOW (ref 3.87–5.11)
RDW: 13.2 % (ref 11.5–15.5)
WBC: 6.4 10*3/uL (ref 4.0–10.5)

## 2012-02-05 MED ORDER — OXYCODONE-ACETAMINOPHEN 5-325 MG PO TABS: 1.0000 | ORAL_TABLET | ORAL | Status: DC | PRN

## 2012-02-05 NOTE — Discharge Summary (Signed)
Physician Discharge Summary  Patient ID: SIMONE RODENBECK MRN: [id number] DOB/AGE: 70-Nov-1942 70 y.o.  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 *    Surgery:  Lap assisted sigmoid colectomy and takedown of colovesical fistula  Discharged Condition: improved  Hospital Course:   Had colectomy;  Path showed inflammation and no cancer.  Foley in place to remain until removed by Dr. Sherron Monday  Consults: none  Significant Diagnostic Studies: none    Discharge Exam: Blood pressure 152/83, pulse 81, temperature 98.6 F (37 C), temperature source Oral, resp. rate 18, height 5\' 11"  (1.803 m), weight 160 lb (72.576 kg), SpO2 100.00%. Incision bland.  Staples to be removed before discharge  Disposition:   Discharge Orders    Future Appointments: Provider: Department: Dept Phone: Center:   02/18/2012 5:00 PM Valarie Merino, MD [facility], [place] [phone] None   05/13/2012 7:30 AM Sherrie George, MD [facility] [phone] None     Future Orders Please Complete By Expires   Diet - low sodium heart healthy      Remove staples      Increase activity slowly      Other Restrictions      Comments:   Maintain Foley and use leg bag.  See Dr. Sherron Monday regarding removal       Medication List     As of 02/05/2012  8:21 AM    STOP taking these medications         psyllium 58.6 % packet   Commonly known as: METAMUCIL      TAKE these medications         aspirin 81 MG EC tablet   Take 81 mg by mouth daily.      CALTRATE 600+D 600-400 MG-UNIT per tablet   Generic drug: Calcium Carbonate-Vitamin D   Take 1 tablet by mouth 2 (two) times daily.      carbamazepine 200 MG tablet   Commonly known as: TEGRETOL   Take 1 and 1/2 by mouth twice a day and 1 by mouth at lunch and dinner.      cyclobenzaprine 5 MG tablet   Commonly known as: FLEXERIL   Take 1 tablet (5 mg total) by mouth 3 (three) times daily as needed for muscle spasms.      levothyroxine 100 MCG tablet   Commonly known as: SYNTHROID, LEVOTHROID   Take 100 mcg by mouth every morning.      losartan 50 MG tablet   Commonly known as: COZAAR   Take 50 mg by mouth every morning.      Magnesium 250 MG Tabs   Take by mouth daily.      meloxicam 15 MG tablet   Commonly known as: MOBIC   Take 1 tablet (15 mg total) by mouth daily as needed for pain.      oxyCODONE-acetaminophen 5-325 MG per tablet   Commonly known as: PERCOCET/ROXICET   Take 1-2 tablets by mouth every 4 (four) hours as needed.      vitamin C 500 MG tablet   Commonly known as: ASCORBIC ACID   Take 250 mg by mouth daily.           Follow-up Information    Call MACDIARMID,SCOTT A, MD.   Contact information:   [address] [facility]  [place] [address] [phone]       Follow up with Luretha Murphy B, MD. In 4 weeks.   Contact information:   [address] [place] [address] [phone]          Signed: Valarie Merino 02/05/2012, 8:21 AM

## 2012-02-05 NOTE — Progress Notes (Signed)
Patient discharged via wheelchair. Patient foley changed to leg bag and pateint and friend states understanding of foleycare and changing leg bag and drainage bag for nighttime. Patient states understanding of discharge instructions. RX for percocet given.

## 2012-02-07 ENCOUNTER — Other Ambulatory Visit (HOSPITAL_COMMUNITY): Payer: Self-pay | Admitting: Urology

## 2012-02-07 ENCOUNTER — Other Ambulatory Visit: Payer: Self-pay | Admitting: Family Medicine

## 2012-02-07 ENCOUNTER — Ambulatory Visit: Payer: Medicare Other

## 2012-02-07 ENCOUNTER — Ambulatory Visit (INDEPENDENT_AMBULATORY_CARE_PROVIDER_SITE_OTHER): Payer: Medicare Other | Admitting: Family Medicine

## 2012-02-07 VITALS — BP 118/68 | HR 93 | Temp 97.4°F | Resp 16 | Ht 71.0 in | Wt 154.0 lb

## 2012-02-07 DIAGNOSIS — S42009A Fracture of unspecified part of unspecified clavicle, initial encounter for closed fracture: Secondary | ICD-10-CM

## 2012-02-07 DIAGNOSIS — M898X1 Other specified disorders of bone, shoulder: Secondary | ICD-10-CM

## 2012-02-07 DIAGNOSIS — S42002A Fracture of unspecified part of left clavicle, initial encounter for closed fracture: Secondary | ICD-10-CM

## 2012-02-07 DIAGNOSIS — M25512 Pain in left shoulder: Secondary | ICD-10-CM

## 2012-02-07 DIAGNOSIS — M949 Disorder of cartilage, unspecified: Secondary | ICD-10-CM

## 2012-02-07 DIAGNOSIS — Z09 Encounter for follow-up examination after completed treatment for conditions other than malignant neoplasm: Secondary | ICD-10-CM

## 2012-02-07 DIAGNOSIS — M25519 Pain in unspecified shoulder: Secondary | ICD-10-CM

## 2012-02-07 NOTE — Progress Notes (Signed)
  Subjective:    Patient ID: Tammy Duke, female    DOB: 1940-11-16, 71 y.o.   MRN: [id number]  HPI  Discharged form hospital 02/05/12, for partial colectomy - colovesical fistula and diverticular abcess - procedure 01/30/12.   Fell last night - got up on side of bed - then when standing up - slight dizziness with new bladder medicine.  - fell to L side onto L shoulder. Hx of osteoporosis.  No prior L shoulder injury/surgery. Hurts to lift arm overhead, but no elbow or wrist pain.  Bruise noticed this morning. Sore on top and front of shoulder.  TX: tylenol last night.  Elevation with pillow.    R hand dominant.    Review of Systems  Musculoskeletal: Positive for joint swelling and arthralgias.  Skin: Positive for color change.  Neurological: Positive for light-headedness (single episode as aabove on new medicine. ). Negative for syncope.       Objective:   Physical Exam  Constitutional: She is oriented to person, place, and time. She appears well-developed and well-nourished. No distress.  Neck: Normal range of motion. Neck supple.  Pulmonary/Chest: Effort normal.  Musculoskeletal:       Left shoulder: She exhibits decreased range of motion (90 flex and abduction. full int rotation, slight pain with cross arm. ), tenderness and bony tenderness. She exhibits no crepitus.       Left elbow: Normal. She exhibits normal range of motion. no tenderness found.       Left wrist: Normal. She exhibits normal range of motion and no bony tenderness.       Cervical back: Normal. She exhibits normal range of motion, no tenderness and no bony tenderness.       Arms: Neurological: She is alert and oriented to person, place, and time.       nvi distally to fingertips.   Skin: Skin is warm, dry and intact. Ecchymosis noted. No petechiae and no rash noted. She is not diaphoretic.     Psychiatric: She has a normal mood and affect. Her behavior is normal.    [facility] reading (PRIMARY) by  Dr.  Neva Seat: L shoulder, L clavicle:  Suspected type II distal 1/3rd clavicular fracture with approx 9mm shortening and comminution.      Assessment & Plan:  Tammy Duke is a 71 y.o. female 1. Clavicle pain  DG Clavicle Left, AMB referral to orthopedics  2. Shoulder pain, left  DG Shoulder Left, AMB referral to orthopedics  3. Fracture of clavicle, left, closed     Likely type 2 distal third clavicular fx, with small area of comminution.  Sling/swath placed, w/c for safety. Tylenol only if able, but has stronger med if needed. Orthostatic precautions with new bladder med and anticholinergic se's discussed. rtc precautions.   Patient Instructions  We will refer you to Dr. Jerl Santos for your clavicula fracture. Wear the sling for now and until seen by ortho. If you are not able to safely walk without use of this arm, then should use wheelchair. Tylenol as needed, or other pain medicine that was prescribed for your surgery. If different pain medicine needed - let me know.

## 2012-02-07 NOTE — Patient Instructions (Signed)
We will refer you to Dr. Jerl Santos for your clavicula fracture. Wear the sling for now and until seen by ortho. If you are not able to safely walk without use of this arm, then should use wheelchair. Tylenol as needed, or other pain medicine that was prescribed for your surgery. If different pain medicine needed - let me know.

## 2012-02-10 ENCOUNTER — Encounter: Payer: Self-pay | Admitting: Radiology

## 2012-02-14 ENCOUNTER — Ambulatory Visit (HOSPITAL_COMMUNITY)
Admission: RE | Admit: 2012-02-14 | Discharge: 2012-02-14 | Disposition: A | Discharge status: Home / Self Care | Payer: Medicare Other | Source: Ambulatory Visit | Attending: Urology | Admitting: Urology

## 2012-02-14 DIAGNOSIS — Z09 Encounter for follow-up examination after completed treatment for conditions other than malignant neoplasm: Secondary | ICD-10-CM | POA: Insufficient documentation

## 2012-02-14 MED ORDER — DIATRIZOATE MEGLUMINE 30 % UR SOLN
Freq: Once | URETHRAL | Status: AC | PRN
  Administered 2012-02-14: 300 mL

## 2012-02-18 ENCOUNTER — Ambulatory Visit (INDEPENDENT_AMBULATORY_CARE_PROVIDER_SITE_OTHER): Payer: Medicare Other | Admitting: Surgery

## 2012-02-18 ENCOUNTER — Encounter (INDEPENDENT_AMBULATORY_CARE_PROVIDER_SITE_OTHER): Payer: Self-pay | Admitting: Surgery

## 2012-02-18 VITALS — BP 148/90 | HR 90 | Temp 98.0°F | Ht 71.0 in | Wt 153.8 lb

## 2012-02-18 DIAGNOSIS — Z9889 Other specified postprocedural states: Secondary | ICD-10-CM

## 2012-02-18 DIAGNOSIS — Z9049 Acquired absence of other specified parts of digestive tract: Secondary | ICD-10-CM

## 2012-02-18 NOTE — Patient Instructions (Signed)
Thanks for your patience.  If you need further assistance after leaving the office, please call our office and speak with a [facility] nurse.  [phone].  If you want to leave a message for Dr. Briseyda Fehr, please call his office phone at [phone]. 

## 2012-02-18 NOTE — Progress Notes (Signed)
Tammy Duke 71 y.o.  Body mass index is 21.45 kg/(m^2).  Patient Active Problem List  Diagnosis  . GOITER, MULTINODULAR  . HYPOTHYROIDISM  . HYPERLIPIDEMIA  . HYPERTENSION  . ALLERGIC RHINITIS  . SYNCOPE  . SEIZURE DISORDER  . FATIGUE  . Preventative health care  . Menopausal syndrome  . Hypocalcemia  . Osteoporosis  . Urinary tract infection, site not specified    Allergies  Allergen Reactions  . Sulfonamide Derivatives Anaphylaxis    caused heart to stop as child  . Erythromycin     Interacts with seizure medication    Past Surgical History  Procedure Date  . S/p left radius fracture surgury 2001    Dr. Jerl Santos, Ortho.  . S/p thyroid surgury for cancer 1985  . Hx of right retenal detachment  Sept. 2005  . S/p right cataract with lens implant   . Left radus   . Laparoscopic sigmoid colectomy 01/30/2012    Procedure: LAPAROSCOPIC SIGMOID COLECTOMY;  Surgeon: Valarie Merino, MD;  Location: [facility];  Service: General;  Laterality: N/A;  Lap Assisted Sigmoid Colectomy with Takedown of Colovesicle Fistula  . Take down of intestinal fistula 01/30/2012    Procedure: TAKE DOWN OF INTESTINAL FISTULA;  Surgeon: Valarie Merino, MD;  Location: [facility];  Service: General;  Laterality: N/A;  . Vesico-vaginal fistula repair 01/30/2012    Procedure: FISTULA REPAIR VESICO-VAGINAL;  Surgeon: Martina Sinner, MD;  Location: [facility];  Service: Urology;  Laterality: N/A;  Enterovesical Fistula Repair   Oliver Barre, MD No diagnosis found.  Incision has healed nicely.  Regular BMs.  Path diverticulitis with fistula.   Will see prn.    Matt B. Daphine Deutscher, MD, [facility] [phone] beeper [phone]  02/18/2012 3:30 PM

## 2012-02-20 ENCOUNTER — Telehealth (INDEPENDENT_AMBULATORY_CARE_PROVIDER_SITE_OTHER): Payer: Self-pay | Admitting: General Surgery

## 2012-02-20 NOTE — Telephone Encounter (Signed)
Pt called to ask when she can begin treating her scar with Vit E oil or Maderma.  Surgery was only 6 weeks ago.   Advises she wait until from surgery.  She agreed.

## 2012-03-13 ENCOUNTER — Telehealth (INDEPENDENT_AMBULATORY_CARE_PROVIDER_SITE_OTHER): Payer: Self-pay

## 2012-03-13 NOTE — Telephone Encounter (Signed)
Returned pt's call regarding her questions about her diet - pertaining to nuts and bananas.  I advised that she stay away from these foods.  She stated that she has been, but was curious because she's about to start her christmas baking and she generally uses these foods in her cookies.  She said she would not use them.  She also stated that she has had virtually no problems and was released from her urologist earlier this week.

## 2012-03-28 ENCOUNTER — Other Ambulatory Visit: Payer: Self-pay | Admitting: Internal Medicine

## 2012-04-14 ENCOUNTER — Other Ambulatory Visit: Payer: Self-pay

## 2012-04-14 MED ORDER — LEVOTHYROXINE SODIUM 100 MCG PO TABS: 100.0000 ug | ORAL_TABLET | Freq: Every morning | ORAL | Status: DC

## 2012-05-13 ENCOUNTER — Ambulatory Visit (INDEPENDENT_AMBULATORY_CARE_PROVIDER_SITE_OTHER): Payer: Medicare Other | Admitting: Ophthalmology

## 2012-05-13 DIAGNOSIS — H348392 Tributary (branch) retinal vein occlusion, unspecified eye, stable: Secondary | ICD-10-CM

## 2012-05-13 DIAGNOSIS — H35039 Hypertensive retinopathy, unspecified eye: Secondary | ICD-10-CM

## 2012-05-13 DIAGNOSIS — H2510 Age-related nuclear cataract, unspecified eye: Secondary | ICD-10-CM

## 2012-05-13 DIAGNOSIS — H43819 Vitreous degeneration, unspecified eye: Secondary | ICD-10-CM

## 2012-05-13 DIAGNOSIS — I10 Essential (primary) hypertension: Secondary | ICD-10-CM

## 2012-07-17 ENCOUNTER — Telehealth: Payer: Self-pay

## 2012-07-17 NOTE — Telephone Encounter (Signed)
Ok for OV with regina today, or other MD

## 2012-07-17 NOTE — Telephone Encounter (Signed)
Indigestion yesterday, vomiting off and on all night.  Stated when throwing up this morning it was green. Has had some crackers, toast and gingerale was able to keep it down.  Please advise if need OV or OTC.  Call back number [phone]

## 2012-07-17 NOTE — Telephone Encounter (Signed)
Informed of MD instructions.  The patient scheduled appointment with [facility] for Friday.

## 2012-07-18 ENCOUNTER — Ambulatory Visit (INDEPENDENT_AMBULATORY_CARE_PROVIDER_SITE_OTHER): Payer: Medicare Other | Admitting: Internal Medicine

## 2012-07-18 ENCOUNTER — Encounter: Payer: Self-pay | Admitting: Internal Medicine

## 2012-07-18 VITALS — BP 128/86 | HR 94 | Temp 97.3°F

## 2012-07-18 DIAGNOSIS — K219 Gastro-esophageal reflux disease without esophagitis: Secondary | ICD-10-CM

## 2012-07-18 MED ORDER — PANTOPRAZOLE SODIUM 40 MG PO TBEC: 40.0000 mg | DELAYED_RELEASE_TABLET | Freq: Every day | ORAL | Status: DC

## 2012-07-18 NOTE — Patient Instructions (Signed)

## 2012-07-18 NOTE — Progress Notes (Signed)
Subjective:    Patient ID: Tammy Duke, female    DOB: Nov 27, 1940, 72 y.o.   MRN: [id number]  HPI  Pt presents to the clinic today with c/o indigestion x 4 days. She is having reflux even when drinking water. She has taken tums and tylenol which does relieve the pain. She does not recall eating anything that caused the indigestion. She has vomited a green substance twice. She has not had a fever. She denies diarrhea. She has not been around anyone with similar symptoms. She denies chest pain or shortness of breath. She does have normal bowel movements daily.  Review of Systems      Past Medical History  Diagnosis Date  . GOITER, MULTINODULAR 05/04/2009  . HYPOTHYROIDISM 04/21/2009  . HYPERLIPIDEMIA 04/21/2009  . HYPERTENSION 04/21/2009  . ALLERGIC RHINITIS 04/21/2009  . SYNCOPE 01/02/2010  . SEIZURE DISORDER 04/21/2009  . FATIGUE 04/21/2009  . THYROID CANCER, HX OF 04/21/2009  . Osteoporosis 10/28/2011  . Environmental allergies     Current Outpatient Prescriptions  Medication Sig Dispense Refill  . aspirin 81 MG EC tablet Take 81 mg by mouth daily.        . Calcium Carbonate-Vitamin D (CALTRATE 600+D) 600-400 MG-UNIT per tablet Take 1 tablet by mouth 2 (two) times daily.        . carbamazepine (TEGRETOL) 200 MG tablet Take 1 and 1/2 by mouth twice a day and 1 by mouth at lunch and dinner.  450 tablet  3  . cyclobenzaprine (FLEXERIL) 5 MG tablet TAKE 1 TABLET BY MOUTH THREE TIMES DAILY AS NEEDED FOR MUSCLE SPASMS  90 tablet  0  . cyclobenzaprine (FLEXERIL) 5 MG tablet TAKE 1 TABLET BY MOUTH THREE TIMES DAILY AS NEEDED FOR MUSCLE SPASMS  90 tablet  0  . levothyroxine (SYNTHROID, LEVOTHROID) 100 MCG tablet Take 1 tablet (100 mcg total) by mouth every morning.  90 tablet  3  . losartan (COZAAR) 50 MG tablet Take 50 mg by mouth every morning.      . Magnesium 250 MG TABS Take by mouth daily.        . meloxicam (MOBIC) 15 MG tablet Take 1 tablet (15 mg total) by mouth daily as needed for  pain.  90 tablet  3  . vitamin C (ASCORBIC ACID) 500 MG tablet Take 250 mg by mouth daily.       . pantoprazole (PROTONIX) 40 MG tablet Take 1 tablet (40 mg total) by mouth daily.  30 tablet  0   No current facility-administered medications for this visit.    Allergies  Allergen Reactions  . Sulfonamide Derivatives Anaphylaxis    caused heart to stop as child  . Erythromycin     Interacts with seizure medication    Family History  Problem Relation Age of Onset  . Heart disease Mother   . Goiter Mother     mother had i-131 rx for a benign goiter  . Heart disease Father   . Stroke Father   . Bipolar disorder Sister   . Heart attack Brother   . Bipolar disorder Brother   . Seizures Other     Strong family history of seizures, 2 brothers and 2 sisters    History   Social History  . Marital Status: Widowed    Spouse Name: N/A    Number of Children: N/A  . Years of Education: N/A   Occupational History  . retired Engineer, water   Social History Main Topics  . Smoking  status: Former Games developer  . Smokeless tobacco: Never Used  . Alcohol Use: No  . Drug Use: No  . Sexually Active: No   Other Topics Concern  . Not on file   Social History Narrative   Lives alone in apt.     Constitutional: Denies fever, malaise, fatigue, headache or abrupt weight changes.  Respiratory: Denies difficulty breathing, shortness of breath, cough or sputum production.   Cardiovascular: Denies chest pain, chest tightness, palpitations or swelling in the hands or feet.  Gastrointestinal: Pt reports indigestion. Denies abdominal pain, bloating, constipation, diarrhea or blood in the stool.     No other specific complaints in a complete review of systems (except as listed in HPI above).  Objective:   Physical Exam  BP 128/86  Pulse 94  Temp(Src) 97.3 F (36.3 C) (Oral)  SpO2 97% Wt Readings from Last 3 Encounters:  02/18/12 153 lb 12.8 oz (69.763 kg)  02/07/12 154 lb (69.854 kg)   01/30/12 160 lb (72.576 kg)    General: Appears her stated age, well developed, well nourished in NAD. Cardiovascular: Normal rate and rhythm. S1,S2 noted.  No murmur, rubs or gallops noted. No JVD or BLE edema. No carotid bruits noted. Pulmonary/Chest: Normal effort and positive vesicular breath sounds. No respiratory distress. No wheezes, rales or ronchi noted.  Abdomen: Soft and nontender. Normal bowel sounds, no bruits noted. No distention or masses noted. Liver, spleen and kidneys non palpable.        Assessment & Plan:   GERD, new onset:  eRx for protonix 40 mg daily x 1 month Can supplement with tums or rolaids Avoid aleve or ibuprofen containing products Avoid spicy and caffeinated foods  If symptoms persist or get worse, come back to the office for a follow up

## 2012-08-13 ENCOUNTER — Other Ambulatory Visit: Payer: Self-pay | Admitting: Internal Medicine

## 2012-08-21 ENCOUNTER — Ambulatory Visit: Payer: Medicare Other | Admitting: Endocrinology

## 2012-09-05 ENCOUNTER — Other Ambulatory Visit (INDEPENDENT_AMBULATORY_CARE_PROVIDER_SITE_OTHER): Payer: Medicare Other

## 2012-09-05 ENCOUNTER — Encounter: Payer: Self-pay | Admitting: Internal Medicine

## 2012-09-05 DIAGNOSIS — I10 Essential (primary) hypertension: Secondary | ICD-10-CM

## 2012-09-05 DIAGNOSIS — Z0000 Encounter for general adult medical examination without abnormal findings: Secondary | ICD-10-CM

## 2012-09-05 DIAGNOSIS — E785 Hyperlipidemia, unspecified: Secondary | ICD-10-CM

## 2012-09-05 LAB — URINALYSIS, ROUTINE W REFLEX MICROSCOPIC
Nitrite: NEGATIVE
Urobilinogen, UA: 0.2 (ref 0.0–1.0)

## 2012-09-05 LAB — LIPID PANEL
Cholesterol: 186 mg/dL (ref 0–200)
HDL: 85.9 mg/dL (ref >39.00)
Triglycerides: 49 mg/dL (ref 0.0–149.0)

## 2012-09-05 LAB — HEPATIC FUNCTION PANEL
ALT: 20 U/L (ref 0–35)
AST: 20 U/L (ref 0–37)
Albumin: 4.2 g/dL (ref 3.5–5.2)
Total Protein: 6.8 g/dL (ref 6.0–8.3)

## 2012-09-05 LAB — CBC WITH DIFFERENTIAL/PLATELET
Basophils Absolute: 0 10*3/uL (ref 0.0–0.1)
HCT: 40.7 % (ref 36.0–46.0)
Hemoglobin: 13.9 g/dL (ref 12.0–15.0)
Lymphs Abs: 0.7 10*3/uL (ref 0.7–4.0)
Monocytes Relative: 7.3 % (ref 3.0–12.0)
Neutro Abs: 3.5 10*3/uL (ref 1.4–7.7)
RDW: 14.5 % (ref 11.5–14.6)

## 2012-09-05 LAB — BASIC METABOLIC PANEL
CO2: 25 mEq/L (ref 19–32)
Calcium: 8.9 mg/dL (ref 8.4–10.5)
GFR: 99.15 mL/min (ref >60.00)
Sodium: 128 mEq/L — ABNORMAL LOW (ref 135–145)

## 2012-09-05 LAB — TSH: TSH: 0.92 u[IU]/mL (ref 0.35–5.50)

## 2012-09-09 ENCOUNTER — Encounter: Payer: Self-pay | Admitting: Internal Medicine

## 2012-09-09 ENCOUNTER — Ambulatory Visit (INDEPENDENT_AMBULATORY_CARE_PROVIDER_SITE_OTHER): Payer: Medicare Other | Admitting: Internal Medicine

## 2012-09-09 VITALS — BP 142/82 | HR 88 | Temp 97.0°F | Ht 71.0 in | Wt 164.2 lb

## 2012-09-09 DIAGNOSIS — N390 Urinary tract infection, site not specified: Secondary | ICD-10-CM

## 2012-09-09 DIAGNOSIS — Z0000 Encounter for general adult medical examination without abnormal findings: Secondary | ICD-10-CM

## 2012-09-09 MED ORDER — CYCLOBENZAPRINE HCL 5 MG PO TABS: 5.0000 mg | ORAL_TABLET | Freq: Three times a day (TID) | ORAL | Status: DC | PRN

## 2012-09-09 NOTE — Patient Instructions (Signed)
Please continue all other medications as before Please have the pharmacy call with any other refills you may need. Please keep your appointments with your specialists as you have planned Please continue your efforts at being more active, low cholesterol diet, and weight control. You are otherwise up to date with prevention measures today.  Please remember to sign up for My Chart if you have not done so, as this will be important to you in the future with finding out test results, communicating by private email, and scheduling acute appointments online when needed.  Please return in 6 months, or sooner if needed

## 2012-09-09 NOTE — Assessment & Plan Note (Signed)

## 2012-09-09 NOTE — Progress Notes (Signed)
Subjective:    Patient ID: Tammy Duke, female    DOB: [birth year], 72 y.o.   MRN: [id number]  HPI  Here for wellness and f/u;  Overall doing ok;  Pt denies CP, worsening SOB, DOE, wheezing, orthopnea, PND, worsening LE edema, palpitations, dizziness or syncope.  Pt denies neurological change such as new headache, facial or extremity weakness.  Pt denies polydipsia, polyuria, or low sugar symptoms. Pt states overall good compliance with treatment and medications, good tolerability, and has been trying to follow lower cholesterol diet.  Pt denies worsening depressive symptoms, suicidal ideation or panic. No fever, night sweats, wt loss, loss of appetite, or other constitutional symptoms.  Pt states good ability with ADL's, has low fall risk, home safety reviewed and adequate, no other significant changes in hearing or vision, and only occasionally active with exercise.  S/p colovesicle fistula repair, just now finishing a last antibx course per Dr McDermott/urology. 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 GOITER, MULTINODULAR 05/04/2009  . HYPOTHYROIDISM 04/21/2009  . HYPERLIPIDEMIA 04/21/2009  . HYPERTENSION 04/21/2009  . ALLERGIC RHINITIS 04/21/2009  . SYNCOPE 01/02/2010  . SEIZURE DISORDER 04/21/2009  . FATIGUE 04/21/2009  . THYROID CANCER, HX OF 04/21/2009  . Osteoporosis 10/28/2011  . Environmental allergies    Past Surgical History  Procedure Laterality Date  . S/p left radius fracture surgury  2001    Dr. Jerl Santos, Ortho.  . S/p thyroid surgury for cancer  1985  . Hx of right retenal detachment   Sept. 2005  . S/p right cataract with lens implant    . Left radus    . Laparoscopic sigmoid colectomy  01/30/2012    Procedure: LAPAROSCOPIC SIGMOID COLECTOMY;  Surgeon: Valarie Merino, MD;  Location: [facility];  Service: General;  Laterality: N/A;  Lap Assisted Sigmoid Colectomy  with Takedown of Colovesicle Fistula  . Take down of intestinal fistula  01/30/2012    Procedure: TAKE DOWN OF INTESTINAL FISTULA;  Surgeon: Valarie Merino, MD;  Location: [facility];  Service: General;  Laterality: N/A;  . Vesico-vaginal fistula repair  01/30/2012    Procedure: FISTULA REPAIR VESICO-VAGINAL;  Surgeon: Martina Sinner, MD;  Location: [facility];  Service: Urology;  Laterality: N/A;  Enterovesical Fistula Repair    reports that she has quit smoking. She has never used smokeless tobacco. She reports that she does not drink alcohol or use illicit drugs. family history includes Bipolar disorder in her brother and sister; Goiter in her mother; Heart attack in her brother; Heart disease in her father and mother; Seizures in her other; and Stroke in her father. Allergies  Allergen Reactions  . Sulfonamide Derivatives Anaphylaxis    caused heart to stop as child  . Erythromycin     Interacts with seizure medication   Current Outpatient Prescriptions on File Prior to Visit  Medication Sig Dispense Refill  . aspirin 81 MG EC tablet Take 81 mg by mouth daily.        . Calcium Carbonate-Vitamin D (CALTRATE 600+D) 600-400 MG-UNIT per tablet Take 1 tablet by mouth 2 (two) times daily.        . carbamazepine (TEGRETOL) 200 MG tablet Take 1 and 1/2 by mouth twice a day and 1 by mouth at lunch and dinner.  450 tablet  3  . levothyroxine (SYNTHROID, LEVOTHROID) 100 MCG tablet Take 1 tablet (  100 mcg total) by mouth every morning.  90 tablet  3  . losartan (COZAAR) 50 MG tablet Take 50 mg by mouth every morning.      . Magnesium 250 MG TABS Take by mouth daily.        . meloxicam (MOBIC) 15 MG tablet Take 1 tablet (15 mg total) by mouth daily as needed for pain.  90 tablet  3  . vitamin C (ASCORBIC ACID) 500 MG tablet Take 250 mg by mouth daily.        No current facility-administered medications on file prior to visit.   Review of Systems Constitutional: Negative for diaphoresis, activity  change, appetite change or unexpected weight change.  HENT: Negative for hearing loss, ear pain, facial swelling, mouth sores and neck stiffness.   Eyes: Negative for pain, redness and visual disturbance.  Respiratory: Negative for shortness of breath and wheezing.   Cardiovascular: Negative for chest pain and palpitations.  Gastrointestinal: Negative for diarrhea, blood in stool, abdominal distention or other pain Genitourinary: Negative for hematuria, flank pain or change in urine volume.  Musculoskeletal: Negative for myalgias and joint swelling.  Skin: Negative for color change and wound.  Neurological: Negative for syncope and numbness. other than noted Hematological: Negative for adenopathy.  Psychiatric/Behavioral: Negative for hallucinations, self-injury, decreased concentration and agitation.      Objective:   Physical Exam BP 142/82  Pulse 88  Temp(Src) 97 F (36.1 C) (Oral)  Ht 5\' 11"  (1.803 m)  Wt 164 lb 4 oz (74.503 kg)  BMI 22.92 kg/m2  SpO2 97% VS noted,  Constitutional: Pt is oriented to person, place, and time. Appears well-developed and well-nourished.  Head: Normocephalic and atraumatic.  Right Ear: External ear normal.  Left Ear: External ear normal.  Nose: Nose normal.  Mouth/Throat: Oropharynx is clear and moist.  Eyes: Conjunctivae and EOM are normal. Pupils are equal, round, and reactive to light.  Neck: Normal range of motion. Neck supple. No JVD present. No tracheal deviation present.  Cardiovascular: Normal rate, regular rhythm, normal heart sounds and intact distal pulses.   Pulmonary/Chest: Effort normal and breath sounds normal.  Abdominal: Soft. Bowel sounds are normal. There is no tenderness. No HSM  Musculoskeletal: Normal range of motion. Exhibits no edema.  Lymphadenopathy:  Has no cervical adenopathy.  Neurological: Pt is alert and oriented to person, place, and time. Pt has normal reflexes. No cranial nerve deficit.  Left knee with trace  effusion, mild crepitus Skin: Skin is warm and dry. No rash noted.  Psychiatric:  Has  normal mood and affect. Behavior is normal.     Assessment & Plan:

## 2012-09-09 NOTE — Assessment & Plan Note (Addendum)
Has f/u with urology, afeb, asympt, has some pyuria but will hold no further antibx for now

## 2012-09-24 ENCOUNTER — Ambulatory Visit (INDEPENDENT_AMBULATORY_CARE_PROVIDER_SITE_OTHER): Payer: Medicare Other | Admitting: Internal Medicine

## 2012-09-24 ENCOUNTER — Encounter: Payer: Self-pay | Admitting: Internal Medicine

## 2012-09-24 ENCOUNTER — Other Ambulatory Visit: Payer: Self-pay

## 2012-09-24 ENCOUNTER — Other Ambulatory Visit (INDEPENDENT_AMBULATORY_CARE_PROVIDER_SITE_OTHER): Payer: Medicare Other

## 2012-09-24 ENCOUNTER — Other Ambulatory Visit: Payer: Self-pay | Admitting: Internal Medicine

## 2012-09-24 VITALS — BP 140/90 | HR 104 | Temp 97.2°F | Ht 71.0 in | Wt 165.5 lb

## 2012-09-24 DIAGNOSIS — N390 Urinary tract infection, site not specified: Secondary | ICD-10-CM

## 2012-09-24 DIAGNOSIS — M1711 Unilateral primary osteoarthritis, right knee: Secondary | ICD-10-CM

## 2012-09-24 DIAGNOSIS — M1710 Unilateral primary osteoarthritis, unspecified knee: Secondary | ICD-10-CM

## 2012-09-24 DIAGNOSIS — I10 Essential (primary) hypertension: Secondary | ICD-10-CM

## 2012-09-24 DIAGNOSIS — E785 Hyperlipidemia, unspecified: Secondary | ICD-10-CM

## 2012-09-24 HISTORY — DX: Unilateral primary osteoarthritis, right knee: M17.11

## 2012-09-24 LAB — URINALYSIS, ROUTINE W REFLEX MICROSCOPIC
Ketones, ur: NEGATIVE
Total Protein, Urine: 30
Urine Glucose: NEGATIVE
pH: 7.5 (ref 5.0–8.0)

## 2012-09-24 MED ORDER — CIPROFLOXACIN HCL 500 MG PO TABS: 500.0000 mg | ORAL_TABLET | Freq: Two times a day (BID) | ORAL | Status: DC

## 2012-09-24 NOTE — Patient Instructions (Signed)
Please continue all other medications as before, and refills have been done if requested. You can also use the ES Tylenol as you mentioned for the right knee Please keep your appointments with your specialists as you have planned - Dr Jerl Santos

## 2012-09-24 NOTE — Progress Notes (Signed)
Subjective:    Patient ID: Tammy Duke, female    DOB: 08-27-1940, 72 y.o.   MRN: [id number]  HPI  Here to f/u ongoing mod to severe pain to right knee with swelling due to end stage DJD, no giveaways or falling down, will eventually need surgury per Dr Jerl Santos, plans to see him soon.  Mobic at max dose, not working well, has not ES tylenol. S/p 2 recent episode antibx (cipro, macrobid) per urology (Dr Jacquelyne Balint) for persistent UTI.  Denies urinary symptoms such as dysuria, frequency, urgency, flank pain, hematuria or n/v, fever, chills, except for increased urinary freq with drinking more water.  Due for leaving for [place] in July 10. Pt denies chest pain, increased sob or doe, wheezing, orthopnea, PND, increased LE swelling, palpitations, dizziness or syncope.Overall good compliance with treatment, and good medicine tolerability, trying to follow lower chol diet Past Medical History  Diagnosis Date  . GOITER, MULTINODULAR 05/04/2009  . HYPOTHYROIDISM 04/21/2009  . HYPERLIPIDEMIA 04/21/2009  . HYPERTENSION 04/21/2009  . ALLERGIC RHINITIS 04/21/2009  . SYNCOPE 01/02/2010  . SEIZURE DISORDER 04/21/2009  . FATIGUE 04/21/2009  . THYROID CANCER, HX OF 04/21/2009  . Osteoporosis 10/28/2011  . Environmental allergies   . Degenerative arthritis of right knee 09/24/2012   Past Surgical History  Procedure Laterality Date  . S/p left radius fracture surgury  2001    Dr. Jerl Santos, Ortho.  . S/p thyroid surgury for cancer  1985  . Hx of right retenal detachment   Sept. 2005  . S/p right cataract with lens implant    . Left radus    . Laparoscopic sigmoid colectomy  01/30/2012    Procedure: LAPAROSCOPIC SIGMOID COLECTOMY;  Surgeon: Valarie Merino, MD;  Location: [facility];  Service: General;  Laterality: N/A;  Lap Assisted Sigmoid Colectomy with Takedown of Colovesicle Fistula  . Take down of intestinal fistula  01/30/2012    Procedure: TAKE DOWN OF INTESTINAL FISTULA;  Surgeon: Valarie Merino, MD;   Location: [facility];  Service: General;  Laterality: N/A;  . Vesico-vaginal fistula repair  01/30/2012    Procedure: FISTULA REPAIR VESICO-VAGINAL;  Surgeon: Martina Sinner, MD;  Location: [facility];  Service: Urology;  Laterality: N/A;  Enterovesical Fistula Repair    reports that she has quit smoking. She has never used smokeless tobacco. She reports that she does not drink alcohol or use illicit drugs. family history includes Bipolar disorder in her brother and sister; Goiter in her mother; Heart attack in her brother; Heart disease in her father and mother; Seizures in her other; and Stroke in her father. Allergies  Allergen Reactions  . Sulfonamide Derivatives Anaphylaxis    caused heart to stop as child  . Erythromycin     Interacts with seizure medication   Current Outpatient Prescriptions on File Prior to Visit  Medication Sig Dispense Refill  . aspirin 81 MG EC tablet Take 81 mg by mouth daily.        . Calcium Carbonate-Vitamin D (CALTRATE 600+D) 600-400 MG-UNIT per tablet Take 1 tablet by mouth 2 (two) times daily.        . carbamazepine (TEGRETOL) 200 MG tablet Take 1 and 1/2 by mouth twice a day and 1 by mouth at lunch and dinner.  450 tablet  3  . cyclobenzaprine (FLEXERIL) 5 MG tablet Take 1 tablet (5 mg total) by mouth 3 (three) times daily as needed for muscle spasms.  90 tablet  3  . levothyroxine (SYNTHROID, LEVOTHROID)  100 MCG tablet Take 1 tablet (100 mcg total) by mouth every morning.  90 tablet  3  . losartan (COZAAR) 50 MG tablet Take 50 mg by mouth every morning.      . Magnesium 250 MG TABS Take by mouth daily.        . meloxicam (MOBIC) 15 MG tablet Take 1 tablet (15 mg total) by mouth daily as needed for pain.  90 tablet  3  . vitamin C (ASCORBIC ACID) 500 MG tablet Take 250 mg by mouth daily.        No current facility-administered medications on file prior to visit.   Review of Systems  Constitutional: Negative for unexpected weight change, or unusual  diaphoresis  HENT: Negative for tinnitus.   Eyes: Negative for photophobia and visual disturbance.  Respiratory: Negative for choking and stridor.   Gastrointestinal: Negative for vomiting and blood in stool.  Genitourinary: Negative for hematuria and decreased urine volume.  Musculoskeletal: Negative for acute joint swelling Skin: Negative for color change and wound.  Neurological: Negative for tremors and numbness other than noted  Psychiatric/Behavioral: Negative for decreased concentration or  hyperactivity.       Objective:   Physical Exam BP 140/90  Pulse 104  Temp(Src) 97.2 F (36.2 C) (Oral)  Ht 5\' 11"  (1.803 m)  Wt 165 lb 8 oz (75.07 kg)  BMI 23.09 kg/m2  SpO2 97% VS noted, not ill appearing Constitutional: Pt appears well-developed and well-nourished.  HENT: Head: NCAT.  Right Ear: External ear normal.  Left Ear: External ear normal.  Eyes: Conjunctivae and EOM are normal. Pupils are equal, round, and reactive to light.  Neck: Normal range of motion. Neck supple.  Cardiovascular: Normal rate and regular rhythm.   Pulmonary/Chest: Effort normal and breath sounds normal.  Abd:  Soft, NT, non-distended, + BS, no flank tender Neurological: Pt is alert. Not confused  Skin: Skin is warm. No erythema.  Psychiatric: Pt behavior is normal. Thought content normal.     Assessment & Plan:

## 2012-09-24 NOTE — Assessment & Plan Note (Signed)
stable overall by history and exam, recent data reviewed with pt, and pt to continue medical treatment as before,  to f/u any worsening symptoms or concerns BP Readings from Last 3 Encounters:  09/24/12 140/90  09/09/12 142/82  07/18/12 128/86

## 2012-09-24 NOTE — Assessment & Plan Note (Signed)
Exam benign, but hx of persitent pyuria, for urine studies, consider repeat antibx

## 2012-09-24 NOTE — Assessment & Plan Note (Signed)
stable overall by history and exam, recent data reviewed with pt, and pt to continue medical treatment as before,  to f/u any worsening symptoms or concerns Lab Results  Component Value Date   LDLCALC 90 09/05/2012

## 2012-09-24 NOTE — Telephone Encounter (Signed)
Resent antibiotic to [facility]

## 2012-09-24 NOTE — Assessment & Plan Note (Signed)
Ongoing but stable, decliens change in tx, plans to f/u with ortho soon, has increased risk of fall

## 2012-09-27 LAB — URINE CULTURE: Colony Count: >=100000

## 2012-09-29 ENCOUNTER — Encounter: Payer: Self-pay | Admitting: Internal Medicine

## 2012-10-02 ENCOUNTER — Other Ambulatory Visit: Payer: Self-pay | Admitting: Internal Medicine

## 2012-10-21 ENCOUNTER — Encounter: Payer: Self-pay | Admitting: Endocrinology

## 2012-10-21 ENCOUNTER — Ambulatory Visit (INDEPENDENT_AMBULATORY_CARE_PROVIDER_SITE_OTHER): Payer: Medicare Other | Admitting: Endocrinology

## 2012-10-21 VITALS — BP 150/80 | HR 96 | Temp 98.0°F | Ht 71.0 in | Wt 166.7 lb

## 2012-10-21 DIAGNOSIS — E042 Nontoxic multinodular goiter: Secondary | ICD-10-CM

## 2012-10-21 NOTE — Patient Instructions (Signed)
Please continue the same levothyroxine Please return in 1 year.

## 2012-10-21 NOTE — Progress Notes (Signed)
Subjective:    Patient ID: Tammy Duke, female    DOB: 24-Dec-1940, 72 y.o.   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  ultrasounds since then have showed a small multinodular goiter, so she has been reassigned a dx of benign multinodular goiter.  She does not notice any nodule at the neck.  pt states she feels well in general.   Chronic primary hypothyroidism: pt states she feels well in general. Past Medical History  Diagnosis Date  . GOITER, MULTINODULAR 05/04/2009  . HYPOTHYROIDISM 04/21/2009  . HYPERLIPIDEMIA 04/21/2009  . HYPERTENSION 04/21/2009  . ALLERGIC RHINITIS 04/21/2009  . SYNCOPE 01/02/2010  . SEIZURE DISORDER 04/21/2009  . FATIGUE 04/21/2009  . THYROID CANCER, HX OF 04/21/2009  . Osteoporosis 10/28/2011  . Environmental allergies   . Degenerative arthritis of right knee 09/24/2012    Past Surgical History  Procedure Laterality Date  . S/p left radius fracture surgury  2001    Dr. Jerl Santos, Ortho.  . S/p thyroid surgury for cancer  1985  . Hx of right retenal detachment   Sept. 2005  . S/p right cataract with lens implant    . Left radus    . Laparoscopic sigmoid colectomy  01/30/2012    Procedure: LAPAROSCOPIC SIGMOID COLECTOMY;  Surgeon: Valarie Merino, MD;  Location: [facility];  Service: General;  Laterality: N/A;  Lap Assisted Sigmoid Colectomy with Takedown of Colovesicle Fistula  . Take down of intestinal fistula  01/30/2012    Procedure: TAKE DOWN OF INTESTINAL FISTULA;  Surgeon: Valarie Merino, MD;  Location: [facility];  Service: General;  Laterality: N/A;  . Vesico-vaginal fistula repair  01/30/2012    Procedure: FISTULA REPAIR VESICO-VAGINAL;  Surgeon: Martina Sinner, MD;  Location: [facility];  Service: Urology;  Laterality: N/A;  Enterovesical Fistula Repair    History   Social History  . Marital Status: Widowed    Spouse Name: N/A    Number of Children: N/A  . Years  of Education: N/A   Occupational History  . retired Engineer, water   Social History Main Topics  . Smoking status: Former Games developer  . Smokeless tobacco: Never Used  . Alcohol Use: No  . Drug Use: No  . Sexually Active: No   Other Topics Concern  . Not on file   Social History Narrative   Lives alone in apt.    Current Outpatient Prescriptions on File Prior to Visit  Medication Sig Dispense Refill  . aspirin 81 MG EC tablet Take 81 mg by mouth daily.        . Calcium Carbonate-Vitamin D (CALTRATE 600+D) 600-400 MG-UNIT per tablet Take 1 tablet by mouth 2 (two) times daily.        . carbamazepine (TEGRETOL) 200 MG tablet Take 1 and 1/2 by mouth twice a day and 1 by mouth at lunch and dinner.  450 tablet  3  . cyclobenzaprine (FLEXERIL) 5 MG tablet Take 1 tablet (5 mg total) by mouth 3 (three) times daily as needed for muscle spasms.  90 tablet  3  . levothyroxine (SYNTHROID, LEVOTHROID) 100 MCG tablet Take 1 tablet (100 mcg total) by mouth every morning.  90 tablet  3  . losartan (COZAAR) 50 MG tablet TAKE ONE TABLET BY MOUTH EVERY DAY  90 tablet  3  . Magnesium 250 MG TABS Take by mouth daily.        Marland Kitchen  meloxicam (MOBIC) 15 MG tablet TAKE ONE TABLET BY MOUTH EVERY DAY AS NEEDED FOR PAIN  90 tablet  3  . vitamin C (ASCORBIC ACID) 500 MG tablet Take 250 mg by mouth daily.       . ciprofloxacin (CIPRO) 500 MG tablet Take 1 tablet (500 mg total) by mouth 2 (two) times daily.  20 tablet  0   No current facility-administered medications on file prior to visit.    Allergies  Allergen Reactions  . Sulfonamide Derivatives Anaphylaxis    caused heart to stop as child  . Erythromycin     Interacts with seizure medication    Family History  Problem Relation Age of Onset  . Heart disease Mother   . Goiter Mother     mother had i-131 rx for a benign goiter  . Heart disease Father   . Stroke Father   . Bipolar disorder Sister   . Heart attack Brother   . Bipolar disorder Brother   .  Seizures Other     Strong family history of seizures, 2 brothers and 2 sisters    BP 150/80  Pulse 96  Temp(Src) 98 F (36.7 C) (Oral)  Ht 5\' 11"  (1.803 m)  Wt 166 lb 11.2 oz (75.615 kg)  BMI 23.26 kg/m2  SpO2 97%   Review of Systems She has regained some of the weight she lost when she had colon surgery.    Objective:   Physical Exam VITAL SIGNS:  See vs page GENERAL: no distress NECK: There is no palpable thyroid enlargement.  No thyroid nodule is palpable.  No palpable lymphadenopathy at the anterior neck.   Lab Results  Component Value Date   TSH 0.92 09/05/2012      Assessment & Plan:  Hypothyroidism, well-replaced H/o small multinodular goiter.  She needs only annual physical exam

## 2012-11-07 ENCOUNTER — Encounter: Payer: Self-pay | Admitting: Radiology

## 2012-11-11 ENCOUNTER — Ambulatory Visit (INDEPENDENT_AMBULATORY_CARE_PROVIDER_SITE_OTHER): Payer: Medicare Other | Admitting: Ophthalmology

## 2012-11-11 DIAGNOSIS — H348392 Tributary (branch) retinal vein occlusion, unspecified eye, stable: Secondary | ICD-10-CM

## 2012-11-11 DIAGNOSIS — I10 Essential (primary) hypertension: Secondary | ICD-10-CM

## 2012-11-11 DIAGNOSIS — H35039 Hypertensive retinopathy, unspecified eye: Secondary | ICD-10-CM

## 2012-11-11 DIAGNOSIS — H43819 Vitreous degeneration, unspecified eye: Secondary | ICD-10-CM

## 2012-11-11 DIAGNOSIS — H33309 Unspecified retinal break, unspecified eye: Secondary | ICD-10-CM

## 2012-12-19 ENCOUNTER — Telehealth: Payer: Self-pay | Admitting: Internal Medicine

## 2012-12-19 NOTE — Telephone Encounter (Signed)
Rec'd from [facility] forward 2 page to Dr.John

## 2012-12-23 ENCOUNTER — Telehealth: Payer: Self-pay | Admitting: Internal Medicine

## 2012-12-23 NOTE — Telephone Encounter (Signed)
Rec'd from [facility] forward 2 pages to Dr.John °

## 2012-12-25 ENCOUNTER — Ambulatory Visit (INDEPENDENT_AMBULATORY_CARE_PROVIDER_SITE_OTHER): Payer: Medicare Other

## 2012-12-25 DIAGNOSIS — Z23 Encounter for immunization: Secondary | ICD-10-CM

## 2013-01-07 ENCOUNTER — Other Ambulatory Visit: Payer: Self-pay | Admitting: Internal Medicine

## 2013-01-07 NOTE — Telephone Encounter (Signed)
Done erx 

## 2013-01-21 ENCOUNTER — Other Ambulatory Visit: Payer: Self-pay | Admitting: Orthopaedic Surgery

## 2013-01-27 ENCOUNTER — Telehealth: Payer: Self-pay | Admitting: Internal Medicine

## 2013-01-27 ENCOUNTER — Encounter (HOSPITAL_COMMUNITY): Payer: Self-pay | Admitting: Pharmacist

## 2013-01-27 NOTE — Telephone Encounter (Signed)
Recd records from [facility]., Forwarding 2pgs to Dr.John °

## 2013-02-02 ENCOUNTER — Encounter (INDEPENDENT_AMBULATORY_CARE_PROVIDER_SITE_OTHER): Payer: Self-pay

## 2013-02-02 ENCOUNTER — Encounter (HOSPITAL_COMMUNITY)
Admission: RE | Admit: 2013-02-02 | Discharge: 2013-02-02 | Disposition: A | Discharge status: Home / Self Care | Payer: Medicare Other | Source: Ambulatory Visit | Attending: Orthopaedic Surgery | Admitting: Orthopaedic Surgery

## 2013-02-02 ENCOUNTER — Encounter (HOSPITAL_COMMUNITY): Payer: Self-pay

## 2013-02-02 DIAGNOSIS — Z01818 Encounter for other preprocedural examination: Secondary | ICD-10-CM | POA: Insufficient documentation

## 2013-02-02 DIAGNOSIS — Z01810 Encounter for preprocedural cardiovascular examination: Secondary | ICD-10-CM | POA: Insufficient documentation

## 2013-02-02 DIAGNOSIS — Z01812 Encounter for preprocedural laboratory examination: Secondary | ICD-10-CM | POA: Insufficient documentation

## 2013-02-02 HISTORY — DX: Malignant (primary) neoplasm, unspecified: C80.1

## 2013-02-02 LAB — CBC WITH DIFFERENTIAL/PLATELET
Basophils Relative: 0 % (ref 0–1)
Eosinophils Absolute: 0.1 10*3/uL (ref 0.0–0.7)
Eosinophils Relative: 2 % (ref 0–5)
HCT: 39.3 % (ref 36.0–46.0)
Hemoglobin: 14.1 g/dL (ref 12.0–15.0)
MCH: 34 pg (ref 26.0–34.0)
MCHC: 35.9 g/dL (ref 30.0–36.0)
MCV: 94.7 fL (ref 78.0–100.0)
Monocytes Absolute: 0.5 10*3/uL (ref 0.1–1.0)
Monocytes Relative: 8 % (ref 3–12)
Neutro Abs: 3.8 10*3/uL (ref 1.7–7.7)
Neutrophils Relative %: 67 % (ref 43–77)
WBC: 5.7 10*3/uL (ref 4.0–10.5)

## 2013-02-02 LAB — BASIC METABOLIC PANEL
BUN: 10 mg/dL (ref 6–23)
CO2: 28 mEq/L (ref 19–32)
Chloride: 94 mEq/L — ABNORMAL LOW (ref 96–112)
GFR calc non Af Amer: 88 mL/min — ABNORMAL LOW (ref >90)
Glucose, Bld: 85 mg/dL (ref 70–99)
Potassium: 3.9 mEq/L (ref 3.5–5.1)

## 2013-02-02 LAB — URINALYSIS, ROUTINE W REFLEX MICROSCOPIC
Glucose, UA: NEGATIVE mg/dL
Ketones, ur: NEGATIVE mg/dL
Nitrite: POSITIVE — AB
Specific Gravity, Urine: 1.019 (ref 1.005–1.030)
pH: 7.5 (ref 5.0–8.0)

## 2013-02-02 LAB — APTT: aPTT: 27 seconds (ref 24–37)

## 2013-02-02 LAB — URINE MICROSCOPIC-ADD ON

## 2013-02-02 LAB — SURGICAL PCR SCREEN: MRSA, PCR: NEGATIVE

## 2013-02-02 LAB — PROTIME-INR: INR: 0.98 (ref 0.00–1.49)

## 2013-02-02 NOTE — Pre-Procedure Instructions (Signed)
Tammy Duke  02/02/2013   Your procedure is scheduled on:  Tuesday, November 11th  Report to Main Entrance "A" and check in with admitting at 0530 AM.  Call this number if you have problems the morning of surgery: [phone]   Remember:   Do not eat food or drink liquids after midnight.   Take these medicines the morning of surgery with A SIP OF WATER: tegretol, synthroid  Stop taking aspirin, over the counter vitamins/herbal medications, NSAIDS (mobic, ibuprofen) 5 days prior to surgery   Do not wear jewelry, make-up or nail polish.  Do not wear lotions, powders, or perfumes. You may wear deodorant.  Do not shave 48 hours prior to surgery. Men may shave face and neck.  Do not bring valuables to the hospital.  [facility] is not responsible  for any belongings or valuables.               Contacts, dentures or bridgework may not be worn into surgery.  Leave suitcase in the car. After surgery it may be brought to your room.  For patients admitted to the hospital, discharge time is determined by your  treatment team.               Patients discharged the day of surgery will not be allowed to drive home.   Special Instructions: Shower using CHG 2 nights before surgery and the night before surgery.  If you shower the day of surgery use CHG.  Use special wash - you have one bottle of CHG for all showers.  You should use approximately 1/3 of the bottle for each shower.   Please read over the following fact sheets that you were given: Pain Booklet, Coughing and Deep Breathing, Blood Transfusion Information, MRSA Information and Surgical Site Infection Prevention

## 2013-02-02 NOTE — Progress Notes (Signed)
Primary physician - dr. Tresa Endo Does not have a cardiologist ekg oct 2013 in epic

## 2013-02-03 LAB — TYPE AND SCREEN

## 2013-02-04 LAB — URINE CULTURE: Colony Count: >=100000

## 2013-02-04 NOTE — Progress Notes (Signed)
Anesthesia Chart Review:  Patient is a 72 year old scheduled for right TKA on 02/10/13 by Dr. Jerl Santos.  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 PCP is Dr. Oliver Barre who is aware that she had future plans for TKA.  EKG on 02/02/13 showed NSR, LVH, nonspecific ST/T wave abnormality.  Echo on 12/27/09 showed: Left ventricle: The cavity size was normal. Wall thickness was increased in a pattern of mild LVH. The estimated ejection fraction was 60%. Wall motion was normal; there were no regional wall motion abnormalities. Left ventricular diastolic function parameters were normal.  CXR on 02/02/13 showed no active disease, mild hyperinflation.  Preoperative labs noted.  Urine culture positive for Klebsiella Pneumoniae. Office has already called her in Cipro this morning.  If no acute changes then I anticipate that she can proceed as planned.  Velna Ochs [facility] Short Stay Center/Anesthesiology Phone [phone] 02/05/2013 10:12 AM

## 2013-02-06 NOTE — H&P (Signed)
TOTAL KNEE ADMISSION H&P  Patient is being admitted for right total knee arthroplasty.  Subjective:  Chief Complaint:right knee pain.  HPI: Tammy Duke, 72 y.o. female, has a history of pain and functional disability in the right knee due to arthritis and has failed non-surgical conservative treatments for greater than 12 weeks to includeNSAID's and/or analgesics, corticosteriod injections, viscosupplementation injections, use of assistive devices and activity modification.  Onset of symptoms was gradual, starting 6 years ago with gradually worsening course since that time. The patient noted no past surgery on the right knee(s).  Patient currently rates pain in the right knee(s) at 9 out of 10 with activity. Patient has night pain, worsening of pain with activity and weight bearing, pain that interferes with activities of daily living, pain with passive range of motion, crepitus and joint swelling.  Patient has evidence of subchondral sclerosis, periarticular osteophytes and joint space narrowing by imaging studies. This patient has had no. There is no active infection.  Patient Active Problem List   Diagnosis Date Noted  . Degenerative arthritis of right knee 09/24/2012  . Urinary tract infection, site not specified 01/21/2012  . Osteoporosis 10/28/2011  . Menopausal syndrome 09/06/2011  . Preventative health care 08/13/2010  . SYNCOPE 01/02/2010  . GOITER, MULTINODULAR 05/04/2009  . HYPOTHYROIDISM 04/21/2009  . HYPERLIPIDEMIA 04/21/2009  . HYPERTENSION 04/21/2009  . ALLERGIC RHINITIS 04/21/2009  . SEIZURE DISORDER 04/21/2009   Past Medical History  Diagnosis Date  . GOITER, MULTINODULAR 05/04/2009  . HYPOTHYROIDISM 04/21/2009  . HYPERLIPIDEMIA 04/21/2009  . HYPERTENSION 04/21/2009  . ALLERGIC RHINITIS 04/21/2009  . SYNCOPE 01/02/2010  . SEIZURE DISORDER 04/21/2009  . FATIGUE 04/21/2009  . THYROID CANCER, HX OF 04/21/2009  . Osteoporosis 10/28/2011  . Environmental allergies   .  Degenerative arthritis of right knee 09/24/2012  . Cancer     thyroid    Past Surgical History  Procedure Laterality Date  . S/p left radius fracture surgury  2001    Dr. Jerl Santos, Ortho.  . S/p thyroid surgury for cancer  1985    biopsy  . Hx of right retenal detachment   Sept. 2005  . S/p right cataract with lens implant    . Left radus    . Laparoscopic sigmoid colectomy  01/30/2012    Procedure: LAPAROSCOPIC SIGMOID COLECTOMY;  Surgeon: Valarie Merino, MD;  Location: [facility] ORS;  Service: General;  Laterality: N/A;  Lap Assisted Sigmoid Colectomy with Takedown of Colovesicle Fistula  . Take down of intestinal fistula  01/30/2012    Procedure: TAKE DOWN OF INTESTINAL FISTULA;  Surgeon: Valarie Merino, MD;  Location: [facility] ORS;  Service: General;  Laterality: N/A;  . Vesico-vaginal fistula repair  01/30/2012    Procedure: FISTULA REPAIR VESICO-VAGINAL;  Surgeon: Martina Sinner, MD;  Location: [facility];  Service: Urology;  Laterality: N/A;  Enterovesical Fistula Repair  . Orif wrist fracture Left 2003    DR Alphonsa Gin Ortho. [phone]  . Eye surgery    . Colon surgery      No prescriptions prior to admission   Allergies  Allergen Reactions  . Sulfonamide Derivatives Anaphylaxis    caused heart to stop as child  . Erythromycin     Interacts with seizure medication  . Latex Rash    History  Substance Use Topics  . Smoking status: Former Games developer  . Smokeless tobacco: Never Used  . Alcohol Use: No    Family History  Problem Relation Age of Onset  .  Heart disease Mother   . Goiter Mother     mother had i-131 rx for a benign goiter  . Heart disease Father   . Stroke Father   . Bipolar disorder Sister   . Heart attack Brother   . Bipolar disorder Brother   . Seizures Other     Strong family history of seizures, 2 brothers and 2 sisters     Review of Systems  Constitutional: Negative.   HENT: Negative.   Eyes: Negative.   Respiratory: Negative.    Cardiovascular: Negative.   Gastrointestinal: Negative.   Genitourinary: Negative.   Musculoskeletal: Positive for joint pain.  Skin: Negative.   Neurological: Negative.   Endo/Heme/Allergies: Negative.   Psychiatric/Behavioral: Negative.     Objective:  Physical Exam  Constitutional: She appears well-nourished.  HENT:  Head: Normocephalic.  Eyes: Pupils are equal, round, and reactive to light.  Neck: Normal range of motion.  Cardiovascular: Normal rate.   Respiratory: Effort normal.  GI: Soft.  Musculoskeletal:  Right knee exam: Range of motion 15-9 5.  Tracy effusion.  Crepitation 1+.  Pain medial joint line.  Good sensory motor  Neurological: She is alert.  Skin: Skin is warm.  Psychiatric: She has a normal mood and affect.    Vital signs in last 24 hours:    Labs:   Estimated body mass index is 23.26 kg/(m^2) as calculated from the following:   Height as of 10/21/12: 5\' 11"  (1.803 m).   Weight as of 10/21/12: 75.615 kg (166 lb 11.2 oz).   Imaging Review Plain radiographs demonstrate severe degenerative joint disease of the right knee(s). The overall alignment ismild valgus. The bone quality appears to be good for age and reported activity level.  Assessment/Plan:  End stage arthritis, right knee   The patient history, physical examination, clinical judgment of the provider and imaging studies are consistent with end stage degenerative joint disease of the right knee(s) and total knee arthroplasty is deemed medically necessary. The treatment options including medical management, injection therapy arthroscopy and arthroplasty were discussed at length. The risks and benefits of total knee arthroplasty were presented and reviewed. The risks due to aseptic loosening, infection, stiffness, patella tracking problems, thromboembolic complications and other imponderables were discussed. The patient acknowledged the explanation, agreed to proceed with the plan and consent was  signed. Patient is being admitted for inpatient treatment for surgery, pain control, PT, OT, prophylactic antibiotics, VTE prophylaxis, progressive ambulation and ADL's and discharge planning. The patient is planning to be discharged to skilled nursing facility

## 2013-02-09 MED ORDER — CEFAZOLIN SODIUM-DEXTROSE 2-3 GM-% IV SOLR
2.0000 g | INTRAVENOUS | Status: AC
  Administered 2013-02-10: 2 g via INTRAVENOUS
  Filled 2013-02-09: qty 50

## 2013-02-10 ENCOUNTER — Encounter (HOSPITAL_COMMUNITY): Payer: Self-pay | Admitting: Surgery

## 2013-02-10 ENCOUNTER — Inpatient Hospital Stay (HOSPITAL_COMMUNITY)
Admission: RE | Admit: 2013-02-10 | Discharge: 2013-02-12 | DRG: 470 | Disposition: A | Discharge status: Skilled Nursing Facility | Payer: Medicare Other | Source: Ambulatory Visit | Attending: Orthopaedic Surgery | Admitting: Orthopaedic Surgery

## 2013-02-10 ENCOUNTER — Encounter (HOSPITAL_COMMUNITY)
Admission: RE | Disposition: A | Discharge status: Skilled Nursing Facility | Payer: Self-pay | Source: Ambulatory Visit | Attending: Orthopaedic Surgery

## 2013-02-10 ENCOUNTER — Inpatient Hospital Stay (HOSPITAL_COMMUNITY): Payer: Medicare Other | Admitting: Anesthesiology

## 2013-02-10 ENCOUNTER — Encounter (HOSPITAL_COMMUNITY): Payer: Medicare Other | Admitting: Vascular Surgery

## 2013-02-10 DIAGNOSIS — Z823 Family history of stroke: Secondary | ICD-10-CM

## 2013-02-10 DIAGNOSIS — Z881 Allergy status to other antibiotic agents status: Secondary | ICD-10-CM

## 2013-02-10 DIAGNOSIS — Z91040 Latex allergy status: Secondary | ICD-10-CM

## 2013-02-10 DIAGNOSIS — M810 Age-related osteoporosis without current pathological fracture: Secondary | ICD-10-CM | POA: Diagnosis present

## 2013-02-10 DIAGNOSIS — M1710 Unilateral primary osteoarthritis, unspecified knee: Principal | ICD-10-CM | POA: Diagnosis present

## 2013-02-10 DIAGNOSIS — M1711 Unilateral primary osteoarthritis, right knee: Secondary | ICD-10-CM | POA: Diagnosis present

## 2013-02-10 DIAGNOSIS — G40909 Epilepsy, unspecified, not intractable, without status epilepticus: Secondary | ICD-10-CM | POA: Diagnosis present

## 2013-02-10 DIAGNOSIS — Z820 Family history of epilepsy and other diseases of the nervous system: Secondary | ICD-10-CM

## 2013-02-10 DIAGNOSIS — Z7982 Long term (current) use of aspirin: Secondary | ICD-10-CM

## 2013-02-10 DIAGNOSIS — Z882 Allergy status to sulfonamides status: Secondary | ICD-10-CM

## 2013-02-10 DIAGNOSIS — E039 Hypothyroidism, unspecified: Secondary | ICD-10-CM | POA: Diagnosis present

## 2013-02-10 DIAGNOSIS — Z87891 Personal history of nicotine dependence: Secondary | ICD-10-CM

## 2013-02-10 DIAGNOSIS — E785 Hyperlipidemia, unspecified: Secondary | ICD-10-CM | POA: Diagnosis present

## 2013-02-10 DIAGNOSIS — Z8249 Family history of ischemic heart disease and other diseases of the circulatory system: Secondary | ICD-10-CM

## 2013-02-10 DIAGNOSIS — Z85850 Personal history of malignant neoplasm of thyroid: Secondary | ICD-10-CM

## 2013-02-10 DIAGNOSIS — Z79899 Other long term (current) drug therapy: Secondary | ICD-10-CM

## 2013-02-10 DIAGNOSIS — Z818 Family history of other mental and behavioral disorders: Secondary | ICD-10-CM

## 2013-02-10 DIAGNOSIS — I10 Essential (primary) hypertension: Secondary | ICD-10-CM | POA: Diagnosis present

## 2013-02-10 HISTORY — PX: TOTAL KNEE ARTHROPLASTY: SHX125

## 2013-02-10 SURGERY — ARTHROPLASTY, KNEE, TOTAL
Anesthesia: General | Site: Knee | Laterality: Right | Wound class: Clean

## 2013-02-10 MED ORDER — PHENOL 1.4 % MT LIQD: 1.0000 | OROMUCOSAL | Status: DC | PRN

## 2013-02-10 MED ORDER — ASPIRIN EC 325 MG PO TBEC
325.0000 mg | DELAYED_RELEASE_TABLET | Freq: Two times a day (BID) | ORAL | Status: DC
  Administered 2013-02-10 – 2013-02-12 (×4): 325 mg via ORAL
  Filled 2013-02-10 (×7): qty 1

## 2013-02-10 MED ORDER — BUPIVACAINE HCL (PF) 0.75 % IJ SOLN
INTRAMUSCULAR | Status: DC | PRN
  Administered 2013-02-10: .6 mL via INTRATHECAL

## 2013-02-10 MED ORDER — CEFAZOLIN SODIUM-DEXTROSE 2-3 GM-% IV SOLR
2.0000 g | Freq: Four times a day (QID) | INTRAVENOUS | Status: AC
  Administered 2013-02-10: 2 g via INTRAVENOUS
  Filled 2013-02-10 (×3): qty 50

## 2013-02-10 MED ORDER — HYDROMORPHONE HCL PF 1 MG/ML IJ SOLN
INTRAMUSCULAR | Status: DC | PRN
  Administered 2013-02-10: .2 mg via INTRAVENOUS
  Administered 2013-02-10: .3 mg via INTRAVENOUS
  Administered 2013-02-10: 0.5 mg via INTRAVENOUS

## 2013-02-10 MED ORDER — EPHEDRINE SULFATE 50 MG/ML IJ SOLN
INTRAMUSCULAR | Status: DC | PRN
  Administered 2013-02-10 (×5): 10 mg via INTRAVENOUS

## 2013-02-10 MED ORDER — SODIUM CHLORIDE 0.9 % IR SOLN
Status: DC | PRN
  Administered 2013-02-10: 3000 mL

## 2013-02-10 MED ORDER — MIDAZOLAM HCL 5 MG/5ML IJ SOLN
INTRAMUSCULAR | Status: DC | PRN
  Administered 2013-02-10: 2 mg via INTRAVENOUS

## 2013-02-10 MED ORDER — PROPOFOL 10 MG/ML IV BOLUS
INTRAVENOUS | Status: DC | PRN
  Administered 2013-02-10: 100 mg via INTRAVENOUS

## 2013-02-10 MED ORDER — ONDANSETRON HCL 4 MG/2ML IJ SOLN
INTRAMUSCULAR | Status: DC | PRN
  Administered 2013-02-10: 4 mg via INTRAVENOUS

## 2013-02-10 MED ORDER — ACETAMINOPHEN 325 MG PO TABS
650.0000 mg | ORAL_TABLET | Freq: Four times a day (QID) | ORAL | Status: DC | PRN
  Administered 2013-02-10 – 2013-02-11 (×2): 650 mg via ORAL
  Filled 2013-02-10 (×2): qty 2

## 2013-02-10 MED ORDER — ONDANSETRON HCL 4 MG/2ML IJ SOLN: 4.0000 mg | Freq: Four times a day (QID) | INTRAMUSCULAR | Status: DC | PRN

## 2013-02-10 MED ORDER — ACETAMINOPHEN 650 MG RE SUPP: 650.0000 mg | Freq: Four times a day (QID) | RECTAL | Status: DC | PRN

## 2013-02-10 MED ORDER — HYDROMORPHONE HCL PF 1 MG/ML IJ SOLN
0.2500 mg | INTRAMUSCULAR | Status: DC | PRN
  Administered 2013-02-10: 0.5 mg via INTRAVENOUS

## 2013-02-10 MED ORDER — OXYCODONE HCL 5 MG/5ML PO SOLN: 5.0000 mg | Freq: Once | ORAL | Status: DC | PRN

## 2013-02-10 MED ORDER — OXYCODONE HCL 5 MG PO TABS: 5.0000 mg | ORAL_TABLET | Freq: Once | ORAL | Status: DC | PRN

## 2013-02-10 MED ORDER — MIDAZOLAM HCL 2 MG/2ML IJ SOLN: 1.0000 mg | INTRAMUSCULAR | Status: DC | PRN

## 2013-02-10 MED ORDER — BUPIVACAINE-EPINEPHRINE PF 0.5-1:200000 % IJ SOLN
INTRAMUSCULAR | Status: DC | PRN
  Administered 2013-02-10: 30 mL

## 2013-02-10 MED ORDER — DEXTROSE IN LACTATED RINGERS 5 % IV SOLN
INTRAVENOUS | Status: DC
  Administered 2013-02-10: 15:00:00 via INTRAVENOUS

## 2013-02-10 MED ORDER — LEVOTHYROXINE SODIUM 100 MCG PO TABS
100.0000 ug | ORAL_TABLET | Freq: Every day | ORAL | Status: DC
  Administered 2013-02-11 – 2013-02-12 (×2): 100 ug via ORAL
  Filled 2013-02-10 (×4): qty 1

## 2013-02-10 MED ORDER — CARBAMAZEPINE 200 MG PO TABS
300.0000 mg | ORAL_TABLET | Freq: Two times a day (BID) | ORAL | Status: DC
  Administered 2013-02-10 – 2013-02-11 (×4): 300 mg via ORAL
  Filled 2013-02-10 (×6): qty 1.5

## 2013-02-10 MED ORDER — METOCLOPRAMIDE HCL 5 MG PO TABS
5.0000 mg | ORAL_TABLET | Freq: Three times a day (TID) | ORAL | Status: DC | PRN
  Filled 2013-02-10: qty 2

## 2013-02-10 MED ORDER — LACTATED RINGERS IV SOLN
INTRAVENOUS | Status: DC
  Administered 2013-02-10 (×2): via INTRAVENOUS

## 2013-02-10 MED ORDER — FENTANYL CITRATE 0.05 MG/ML IJ SOLN: 50.0000 ug | Freq: Once | INTRAMUSCULAR | Status: DC

## 2013-02-10 MED ORDER — BUPIVACAINE LIPOSOME 1.3 % IJ SUSP
INTRAMUSCULAR | Status: DC | PRN
  Administered 2013-02-10: 20 mL

## 2013-02-10 MED ORDER — LOSARTAN POTASSIUM 50 MG PO TABS
50.0000 mg | ORAL_TABLET | Freq: Every day | ORAL | Status: DC
  Administered 2013-02-10 – 2013-02-12 (×3): 50 mg via ORAL
  Filled 2013-02-10 (×3): qty 1

## 2013-02-10 MED ORDER — ALUM & MAG HYDROXIDE-SIMETH 200-200-20 MG/5ML PO SUSP
30.0000 mL | ORAL | Status: DC | PRN
  Administered 2013-02-12: 30 mL via ORAL
  Filled 2013-02-10: qty 30

## 2013-02-10 MED ORDER — METOCLOPRAMIDE HCL 5 MG/ML IJ SOLN: 5.0000 mg | Freq: Three times a day (TID) | INTRAMUSCULAR | Status: DC | PRN

## 2013-02-10 MED ORDER — OXYCODONE HCL 5 MG PO TABS
5.0000 mg | ORAL_TABLET | ORAL | Status: DC | PRN
  Administered 2013-02-10 – 2013-02-12 (×9): 10 mg via ORAL
  Filled 2013-02-10 (×9): qty 2

## 2013-02-10 MED ORDER — HYDROMORPHONE HCL PF 1 MG/ML IJ SOLN
INTRAMUSCULAR | Status: AC
  Filled 2013-02-10: qty 1

## 2013-02-10 MED ORDER — MENTHOL 3 MG MT LOZG: 1.0000 | LOZENGE | OROMUCOSAL | Status: DC | PRN

## 2013-02-10 MED ORDER — CHLORHEXIDINE GLUCONATE 4 % EX LIQD: 60.0000 mL | Freq: Once | CUTANEOUS | Status: DC

## 2013-02-10 MED ORDER — CARBAMAZEPINE 200 MG PO TABS
200.0000 mg | ORAL_TABLET | ORAL | Status: DC
  Administered 2013-02-10 – 2013-02-12 (×4): 200 mg via ORAL
  Filled 2013-02-10 (×6): qty 1

## 2013-02-10 MED ORDER — BUPIVACAINE LIPOSOME 1.3 % IJ SUSP
20.0000 mL | INTRAMUSCULAR | Status: DC
  Filled 2013-02-10: qty 20

## 2013-02-10 MED ORDER — PROPOFOL INFUSION 10 MG/ML OPTIME
INTRAVENOUS | Status: DC | PRN
  Administered 2013-02-10: 75 ug/kg/min via INTRAVENOUS

## 2013-02-10 MED ORDER — ONDANSETRON HCL 4 MG PO TABS: 4.0000 mg | ORAL_TABLET | Freq: Four times a day (QID) | ORAL | Status: DC | PRN

## 2013-02-10 MED ORDER — PHENYLEPHRINE HCL 10 MG/ML IJ SOLN
INTRAMUSCULAR | Status: DC | PRN
  Administered 2013-02-10: 80 ug via INTRAVENOUS
  Administered 2013-02-10: 120 ug via INTRAVENOUS
  Administered 2013-02-10 (×5): 80 ug via INTRAVENOUS

## 2013-02-10 MED ORDER — TRANEXAMIC ACID 100 MG/ML IV SOLN
1000.0000 mg | INTRAVENOUS | Status: AC
  Administered 2013-02-10: 1000 mg via INTRAVENOUS
  Filled 2013-02-10: qty 10

## 2013-02-10 MED ORDER — FENTANYL CITRATE 0.05 MG/ML IJ SOLN
INTRAMUSCULAR | Status: DC | PRN
  Administered 2013-02-10 (×5): 50 ug via INTRAVENOUS

## 2013-02-10 MED ORDER — MORPHINE SULFATE 2 MG/ML IJ SOLN: 1.0000 mg | INTRAMUSCULAR | Status: DC | PRN

## 2013-02-10 SURGICAL SUPPLY — 69 items
APL SKNCLS STERI-STRIP NONHPOA (GAUZE/BANDAGES/DRESSINGS) ×1
BANDAGE ELASTIC 4 VELCRO ST LF (GAUZE/BANDAGES/DRESSINGS) ×2 IMPLANT
BANDAGE ELASTIC 6 VELCRO ST LF (GAUZE/BANDAGES/DRESSINGS) ×1 IMPLANT
BANDAGE ESMARK 6X9 LF (GAUZE/BANDAGES/DRESSINGS) ×1 IMPLANT
BANDAGE GAUZE ELAST BULKY 4 IN (GAUZE/BANDAGES/DRESSINGS) ×3 IMPLANT
BENZOIN TINCTURE PRP APPL 2/3 (GAUZE/BANDAGES/DRESSINGS) ×1 IMPLANT
BLADE SAGITTAL 25.0X1.19X90 (BLADE) ×2 IMPLANT
BLADE SURG ROTATE 9660 (MISCELLANEOUS) IMPLANT
BNDG CMPR 9X6 STRL LF SNTH (GAUZE/BANDAGES/DRESSINGS) ×1
BNDG CMPR MED 10X6 ELC LF (GAUZE/BANDAGES/DRESSINGS) ×1
BNDG ELASTIC 6X10 VLCR STRL LF (GAUZE/BANDAGES/DRESSINGS) ×2 IMPLANT
BNDG ESMARK 6X9 LF (GAUZE/BANDAGES/DRESSINGS) ×2
BOWL SMART MIX CTS (DISPOSABLE) ×2 IMPLANT
CAPT RP KNEE ×1 IMPLANT
CEMENT HV SMART SET (Cement) ×4 IMPLANT
CLOTH BEACON ORANGE TIMEOUT ST (SAFETY) ×2 IMPLANT
CLSR STERI-STRIP ANTIMIC 1/2X4 (GAUZE/BANDAGES/DRESSINGS) ×1 IMPLANT
COVER SURGICAL LIGHT HANDLE (MISCELLANEOUS) ×2 IMPLANT
CUFF TOURNIQUET SINGLE 34IN LL (TOURNIQUET CUFF) ×2 IMPLANT
CUFF TOURNIQUET SINGLE 44IN (TOURNIQUET CUFF) IMPLANT
DRAPE EXTREMITY T 121X128X90 (DRAPE) ×2 IMPLANT
DRAPE PROXIMA HALF (DRAPES) ×2 IMPLANT
DRAPE U-SHAPE 47X51 STRL (DRAPES) ×2 IMPLANT
DRSG ADAPTIC 3X8 NADH LF (GAUZE/BANDAGES/DRESSINGS) ×2 IMPLANT
DRSG PAD ABDOMINAL 8X10 ST (GAUZE/BANDAGES/DRESSINGS) ×2 IMPLANT
DURAPREP 26ML APPLICATOR (WOUND CARE) ×2 IMPLANT
ELECT REM PT RETURN 9FT ADLT (ELECTROSURGICAL) ×2
ELECTRODE REM PT RTRN 9FT ADLT (ELECTROSURGICAL) ×1 IMPLANT
FACESHIELD LNG OPTICON STERILE (SAFETY) ×4 IMPLANT
GLOVE BIO SURGEON STRL SZ8.5 (GLOVE) ×2 IMPLANT
GLOVE BIOGEL PI IND STRL 8 (GLOVE) ×1 IMPLANT
GLOVE BIOGEL PI IND STRL 8.5 (GLOVE) ×1 IMPLANT
GLOVE BIOGEL PI INDICATOR 8 (GLOVE) ×1
GLOVE BIOGEL PI INDICATOR 8.5 (GLOVE) ×1
GLOVE SS BIOGEL STRL SZ 8 (GLOVE) ×1 IMPLANT
GLOVE SUPERSENSE BIOGEL SZ 8 (GLOVE) ×1
GOWN PREVENTION PLUS XLARGE (GOWN DISPOSABLE) ×2 IMPLANT
GOWN PREVENTION PLUS XXLARGE (GOWN DISPOSABLE) ×2 IMPLANT
GOWN STRL NON-REIN LRG LVL3 (GOWN DISPOSABLE) ×2 IMPLANT
HANDPIECE INTERPULSE COAX TIP (DISPOSABLE) ×2
HOOD PEEL AWAY FACE SHEILD DIS (HOOD) ×2 IMPLANT
IMMOBILIZER KNEE 20 (SOFTGOODS)
IMMOBILIZER KNEE 20 THIGH 36 (SOFTGOODS) IMPLANT
IMMOBILIZER KNEE 22 UNIV (SOFTGOODS) ×2 IMPLANT
IMMOBILIZER KNEE 24 THIGH 36 (MISCELLANEOUS) IMPLANT
IMMOBILIZER KNEE 24 UNIV (MISCELLANEOUS)
KIT BASIN OR (CUSTOM PROCEDURE TRAY) ×2 IMPLANT
KIT ROOM TURNOVER OR (KITS) ×2 IMPLANT
MANIFOLD NEPTUNE II (INSTRUMENTS) ×2 IMPLANT
NDL HYPO 21X1 ECLIPSE (NEEDLE) ×1 IMPLANT
NEEDLE HYPO 21X1 ECLIPSE (NEEDLE) ×2 IMPLANT
NS IRRIG 1000ML POUR BTL (IV SOLUTION) ×2 IMPLANT
PACK TOTAL JOINT (CUSTOM PROCEDURE TRAY) ×2 IMPLANT
PAD ARMBOARD 7.5X6 YLW CONV (MISCELLANEOUS) ×4 IMPLANT
SET HNDPC FAN SPRY TIP SCT (DISPOSABLE) ×1 IMPLANT
SPONGE GAUZE 4X4 12PLY (GAUZE/BANDAGES/DRESSINGS) ×2 IMPLANT
STAPLER VISISTAT 35W (STAPLE) IMPLANT
SUCTION FRAZIER TIP 10 FR DISP (SUCTIONS) IMPLANT
SUT MNCRL AB 3-0 PS2 18 (SUTURE) IMPLANT
SUT VIC AB 0 CT1 27 (SUTURE) ×4
SUT VIC AB 0 CT1 27XBRD ANBCTR (SUTURE) ×2 IMPLANT
SUT VIC AB 2-0 CT1 27 (SUTURE) ×4
SUT VIC AB 2-0 CT1 TAPERPNT 27 (SUTURE) ×2 IMPLANT
SUT VLOC 180 0 24IN GS25 (SUTURE) ×2 IMPLANT
SYR 50ML LL SCALE MARK (SYRINGE) ×2 IMPLANT
TOWEL OR 17X24 6PK STRL BLUE (TOWEL DISPOSABLE) ×2 IMPLANT
TOWEL OR 17X26 10 PK STRL BLUE (TOWEL DISPOSABLE) ×2 IMPLANT
TRAY FOLEY CATH 14FR (SET/KITS/TRAYS/PACK) ×2 IMPLANT
WATER STERILE IRR 1000ML POUR (IV SOLUTION) ×4 IMPLANT

## 2013-02-10 NOTE — H&P (Signed)
Agree 

## 2013-02-10 NOTE — Progress Notes (Signed)
Orthopedic Tech Progress Note Patient Details:  Tammy Duke September 27, 1940 [id number]  CPM Right Knee CPM Right Knee: On Right Knee Flexion (Degrees): 60 Right Knee Extension (Degrees): 0 Additional Comments: Trapeze bar    Cammer, Mickie Bail 02/10/2013, 11:08 AM

## 2013-02-10 NOTE — Interval H&P Note (Signed)
History and Physical Interval Note:  02/10/2013 7:19 AM  Tammy Duke  has presented today for surgery, with the diagnosis of RIGHT KNEE DEGENERATIVE JOINT DISEASE  The various methods of treatment have been discussed with the patient and family. After consideration of risks, benefits and other options for treatment, the patient has consented to  Procedure(s): TOTAL KNEE ARTHROPLASTY (Right) as a surgical intervention .  The patient's history has been reviewed, patient examined, no change in status, stable for surgery.  I have reviewed the patient's chart and labs.  Questions were answered to the patient's satisfaction.     Dimond Crotty G

## 2013-02-10 NOTE — Op Note (Signed)
PREOP DIAGNOSIS: DJD RIGHT KNEE POSTOP DIAGNOSIS: same PROCEDURE: RIGHT TKR ANESTHESIA: Spinal and block ATTENDING SURGEON: Shawntia Mangal G ASSISTANT: Elodia Florence PA  INDICATIONS FOR PROCEDURE: Tammy Duke is a 72 y.o. female who has struggled for a long time with pain due to degenerative arthritis of the right knee.  The patient has failed many conservative non-operative measures and at this point has pain which limits the ability to sleep and walk.  The patient is offered total knee replacement.  Informed operative consent was obtained after discussion of possible risks of anesthesia, infection, neurovascular injury, DVT, and death.  The importance of the post-operative rehabilitation protocol to optimize result was stressed extensively with the patient.  SUMMARY OF FINDINGS AND PROCEDURE:  Tammy Duke was taken to the operative suite where under the above anesthesia a right knee replacement was performed.  There were advanced degenerative changes and the bone quality was fair.  We used the DePuy system and placed size standard plus femur, 5 tibia, 41 mm all polyethylene patella, and a size 10 mm spacer.  The patient was admitted for appropriate post-op care to include perioperative antibiotics and mechanical and pharmacologic measures for DVT prophylaxis.  DESCRIPTION OF PROCEDURE:  Tammy Duke was taken to the operative suite where the above anesthesia was applied.  The patient was positioned supine and prepped and draped in normal sterile fashion.  An appropriate time out was performed.  After the administration of Kefzol pre-op antibiotic the leg was elevated and exsanguinated and a tourniquet inflated. A standard longitudinal incision was made on the anterior knee.  Dissection was carried down to the extensor mechanism.  All appropriate anti-infective measures were used including the pre-operative antibiotic, betadine impregnated drape, and closed hooded exhaust systems for  each member of the surgical team.  A medial parapatellar incision was made in the extensor mechanism and the knee cap flipped and the knee flexed.  Some residual meniscal tissues were removed along with any remaining ACL/PCL tissue.  A guide was placed on the tibia and a flat cut was made on it's superior surface.  An intramedullary guide was placed in the femur and was utilized to make anterior and posterior cuts creating an appropriate flexion gap.  A second intramedullary guide was placed in the femur to make a distal cut properly balancing the knee with an extension gap equal to the flexion gap.  The three bones sized to the above mentioned sizes and the appropriate guides were placed and utilized.  A trial reduction was done and the knee easily came to full extension and the patella tracked well on flexion.  The trial components were removed and all bones were cleaned with pulsatile lavage and then dried thoroughly.  Cement was mixed and was pressurized onto the bones followed by placement of the aforementioned components.  Excess cement was trimmed and pressure was held on the components until the cement had hardened.  The tourniquet was deflated and a small amount of bleeding was controlled with cautery and pressure.  The knee was irrigated thoroughly.  The extensor mechanism was re-approximated with V-loc suture in running fashion.  The knee was flexed and the repair was solid.  I injected with Exparel towards the end of the case. The subcutaneous tissues were re-approximated with #0 and #2-0 vicryl and the skin closed with a subcuticular stitch and steristrips.  A sterile dressing was applied.  Intraoperative fluids, EBL, and tourniquet time can be obtained from anesthesia records.  DISPOSITION:  The patient was taken to recovery room in stable condition and admitted for appropriate post-op care to include peri-operative antibiotic and DVT prophylaxis with mechanical and pharmacologic  measures.  Jody Aguinaga G 02/10/2013, 9:20 AM

## 2013-02-10 NOTE — Progress Notes (Signed)
Patient informed Nurse that she has been taking Cipro for a UTI BID for the past five days, however patient is unaware of dose of Cipro. Patient stated she has only two days left until Cipro is completed. Will inform Pharmacy Tech of this. Patient uses [facility], community on [address].

## 2013-02-10 NOTE — Anesthesia Procedure Notes (Addendum)
Anesthesia Regional Block:  Adductor canal block  Pre-Anesthetic Checklist: ,, timeout performed, Correct Patient, Correct Site, Correct Laterality, Correct Procedure, Correct Position, site marked, Risks and benefits discussed,  Surgical consent,  Pre-op evaluation,  At surgeon's request and post-op pain management  Laterality: Right  Prep: chloraprep       Needles:  Injection technique: Single-shot  Needle Type: Echogenic Stimulator Needle      Needle Gauge: 21 and 21 G    Additional Needles:  Procedures: ultrasound guided (picture in chart) Adductor canal block Narrative:  Start time: 02/10/2013 7:00 AM End time: 02/10/2013 7:10 AM Injection made incrementally with aspirations every 5 mL.  Performed by: Personally  Anesthesiologist: Arta Bruce MD  Additional Notes: Monitors applied. Patient sedated. Sterile prep and drape,hand hygiene and sterile gloves were used. Relevant anatomy identified.Needle position confirmed.Local anesthetic injected incrementally after negative aspiration. Local anesthetic spread visualized around nerve(s). Vascular puncture avoided. No complications. Image printed for medical record.The patient tolerated the procedure well.    Arta Bruce MD  Adductor canal block Spinal  Patient location during procedure: OR Start time: 02/10/2013 7:27 AM End time: 02/10/2013 7:36 AM Preanesthetic Checklist Completed: patient identified, site marked, surgical consent, pre-op evaluation, timeout performed, IV checked, risks and benefits discussed and monitors and equipment checked Spinal Block Patient position: sitting Prep: Betadine Approach: midline Location: L3-4 Injection technique: single-shot Needle Needle gauge: 22 G Assessment Sensory level: T6 Additional Notes SAB Sitting position Betadine prepX3 Sterile tech Lido 1% local #22 needle with single midline pass and clear CSF Bupivicaine .75% .6cc No paresthesias or other compl Dr  Gypsy Balsam T8/T8 sensory level  Procedure Name: LMA Insertion Date/Time: 02/10/2013 8:13 AM Performed by: Sharlene Dory E Pre-anesthesia Checklist: Patient identified, Emergency Drugs available, Suction available, Patient being monitored and Timeout performed Patient Re-evaluated:Patient Re-evaluated prior to inductionOxygen Delivery Method: Circle system utilized Preoxygenation: Pre-oxygenation with 100% oxygen Intubation Type: IV induction LMA: LMA inserted LMA Size: 4.0 Number of attempts: 1 Placement Confirmation: positive ETCO2 and breath sounds checked- equal and bilateral Tube secured with: Tape Dental Injury: Teeth and Oropharynx as per pre-operative assessment

## 2013-02-10 NOTE — Progress Notes (Signed)
Report given to angel rn as caregiver 

## 2013-02-10 NOTE — Anesthesia Preprocedure Evaluation (Addendum)
Anesthesia Evaluation  Patient identified by MRN, date of birth, ID band Patient awake    Reviewed: Allergy & Precautions, H&P , NPO status , Patient's Chart, lab work & pertinent test results  Airway Mallampati: II TM Distance: >3 FB Neck ROM: Full    Dental  (+) Teeth Intact   Pulmonary former smoker,  breath sounds clear to auscultation        Cardiovascular hypertension, Rhythm:Regular Rate:Normal     Neuro/Psych Seizures -,     GI/Hepatic   Endo/Other    Renal/GU      Musculoskeletal   Abdominal   Peds  Hematology   Anesthesia Other Findings   Reproductive/Obstetrics                          Anesthesia Physical Anesthesia Plan  ASA: III  Anesthesia Plan: Spinal   Post-op Pain Management: MAC Combined w/ Regional for Post-op pain   Induction: Intravenous  Airway Management Planned: Simple Face Mask  Additional Equipment:   Intra-op Plan:   Post-operative Plan:   Informed Consent: I have reviewed the patients History and Physical, chart, labs and discussed the procedure including the risks, benefits and alternatives for the proposed anesthesia with the patient or authorized representative who has indicated his/her understanding and acceptance.     Plan Discussed with: CRNA and Surgeon  Anesthesia Plan Comments:         Anesthesia Quick Evaluation

## 2013-02-10 NOTE — Care Management Utilization Note (Signed)
Utilization review completed. Lorrene Graef, RN BSN 

## 2013-02-10 NOTE — Anesthesia Postprocedure Evaluation (Signed)
  Anesthesia Post-op Note  Patient: Tammy Duke  Procedure(s) Performed: Procedure(s): TOTAL KNEE ARTHROPLASTY (Right)  Patient Location: PACU  Anesthesia Type:General and Spinal  Level of Consciousness: awake  Airway and Oxygen Therapy: Patient Spontanous Breathing  Post-op Pain: mild  Post-op Assessment: Post-op Vital signs reviewed, Patient's Cardiovascular Status Stable, Respiratory Function Stable, Patent Airway, No signs of Nausea or vomiting and Pain level controlled  Post-op Vital Signs: Reviewed and stable  Complications: No apparent anesthesia complications

## 2013-02-10 NOTE — Transfer of Care (Signed)
Immediate Anesthesia Transfer of Care Note  Patient: Tammy Duke  Procedure(s) Performed: Procedure(s): TOTAL KNEE ARTHROPLASTY (Right)  Patient Location: PACU  Anesthesia Type:General  Level of Consciousness: awake, alert  and oriented  Airway & Oxygen Therapy: Patient Spontanous Breathing and Patient connected to nasal cannula oxygen  Post-op Assessment: Report given to PACU RN, Post -op Vital signs reviewed and stable and Patient moving all extremities X 4  Post vital signs: Reviewed and stable  Complications: No apparent anesthesia complications

## 2013-02-10 NOTE — Evaluation (Signed)
Physical Therapy Evaluation Patient Details Name: JAEDYN MARRUFO MRN: [id number] DOB: [birth year] Today's Date: 02/10/2013 Time: 6295-2841 PT Time Calculation (min): 19 min  PT Assessment / Plan / Recommendation History of Present Illness  Pt is a 72 y/o female admitted s/p R TKA on 02/10/13.  Clinical Impression  This patient presents with acute pain and decreased functional independence following the above mentioned procedure. At the time of PT eval, pt required min assist to transition to EOB and for occasional walker placement during turn to chair. Pt states she already has plans in place to d/c to SNF. This patient is appropriate for skilled PT interventions to address functional limitations, improve safety and independence with functional mobility, and return to PLOF.     PT Assessment  Patient needs continued PT services    Follow Up Recommendations  SNF    Does the patient have the potential to tolerate intense rehabilitation      Barriers to Discharge        Equipment Recommendations  None recommended by PT    Recommendations for Other Services     Frequency 7X/week    Precautions / Restrictions Precautions Precautions: Fall;Knee Required Braces or Orthoses: Knee Immobilizer - Right Knee Immobilizer - Right: On when out of bed or walking Restrictions Weight Bearing Restrictions: Yes RLE Weight Bearing: Weight bearing as tolerated   Pertinent Vitals/Pain Pt reports her pain is better this afternoon but is worried about moving around to flare it back up. Pt has no complaints of pain after sitting up in chair.      Mobility  Bed Mobility Bed Mobility: Supine to Sit;Sitting - Scoot to Edge of Bed Supine to Sit: 4: Min assist;HOB elevated;With rails Sitting - Scoot to Edge of Bed: 4: Min guard Details for Bed Mobility Assistance: VC's for sequencing and technique. Assist for support of RLE, and pt was cued to reach for the bed rails for  assist. Transfers Transfers: Sit to Stand;Stand to Sit Sit to Stand: 4: Min assist;From bed;With upper extremity assist Stand to Sit: 4: Min assist;To toilet;With upper extremity assist Details for Transfer Assistance: VC's for hand placement on seated surface. Pt fairly tall but did well without the raising the bed, Ambulation/Gait Ambulation/Gait Assistance: 4: Min guard Ambulation Distance (Feet): 5 Feet Assistive device: Rolling walker Ambulation/Gait Assistance Details: VC's for sequencing and safety awareness with the RW. Specific cueing required for walker positioning during turns.  Gait Pattern: Step-to pattern;Decreased stride length;Trunk flexed Gait velocity: Decreased Stairs: No    Exercises Total Joint Exercises Ankle Circles/Pumps: 10 reps Quad Sets: 10 reps   PT Diagnosis: Difficulty walking;Acute pain  PT Problem List: Decreased strength;Decreased range of motion;Decreased activity tolerance;Decreased balance;Decreased mobility;Decreased knowledge of use of DME;Decreased safety awareness;Decreased knowledge of precautions;Pain PT Treatment Interventions: DME instruction;Gait training;Stair training;Functional mobility training;Therapeutic activities;Therapeutic exercise;Neuromuscular re-education;Patient/family education     PT Goals(Current goals can be found in the care plan section) Acute Rehab PT Goals Patient Stated Goal: To be able to take care of herself after she gets home from rehab PT Goal Formulation: With patient Time For Goal Achievement: 02/17/13 Potential to Achieve Goals: Good  Visit Information  Last PT Received On: 02/10/13 Assistance Needed: +1 History of Present Illness: Pt is a 72 y/o female admitted s/p R TKA on 02/10/13.       Prior Functioning  Home Living Family/patient expects to be discharged to:: Skilled nursing facility Living Arrangements: Alone Prior Function Level of Independence: Independent with assistive device(s)  (  RW) Communication Communication: No difficulties Dominant Hand: Right    Cognition  Cognition Arousal/Alertness: Awake/alert Behavior During Therapy: WFL for tasks assessed/performed Overall Cognitive Status: Within Functional Limits for tasks assessed    Extremity/Trunk Assessment Upper Extremity Assessment Upper Extremity Assessment: Defer to OT evaluation Lower Extremity Assessment Lower Extremity Assessment: RLE deficits/detail RLE Deficits / Details: Decreased strength and AROM consistent with TKA. RLE: Unable to fully assess due to pain Cervical / Trunk Assessment Cervical / Trunk Assessment: Normal   Balance    End of Session PT - End of Session Equipment Utilized During Treatment: Gait belt;Right knee immobilizer Activity Tolerance: Patient tolerated treatment well Patient left: in chair;with call bell/phone within reach Nurse Communication: Mobility status CPM Right Knee CPM Right Knee: Off  GP     Ruthann Cancer 02/10/2013, 4:47 PM  Ruthann Cancer, PT, DPT [phone]

## 2013-02-10 NOTE — Preoperative (Signed)
Beta Blockers   Reason not to administer Beta Blockers:Not Applicable 

## 2013-02-11 ENCOUNTER — Encounter (HOSPITAL_COMMUNITY): Payer: Self-pay | Admitting: Orthopaedic Surgery

## 2013-02-11 LAB — BASIC METABOLIC PANEL
CO2: 27 mEq/L (ref 19–32)
Calcium: 8 mg/dL — ABNORMAL LOW (ref 8.4–10.5)
Chloride: 95 mEq/L — ABNORMAL LOW (ref 96–112)
GFR calc Af Amer: >90 mL/min (ref >90)
Glucose, Bld: 100 mg/dL — ABNORMAL HIGH (ref 70–99)
Sodium: 131 mEq/L — ABNORMAL LOW (ref 135–145)

## 2013-02-11 LAB — CBC
HCT: 27.9 % — ABNORMAL LOW (ref 36.0–46.0)
Hemoglobin: 9.7 g/dL — ABNORMAL LOW (ref 12.0–15.0)
MCH: 33.4 pg (ref 26.0–34.0)
MCHC: 34.8 g/dL (ref 30.0–36.0)
Platelets: 172 10*3/uL (ref 150–400)

## 2013-02-11 MED ORDER — POLYETHYLENE GLYCOL 3350 17 G PO PACK
17.0000 g | PACK | Freq: Every day | ORAL | Status: DC | PRN
  Administered 2013-02-11: 17 g via ORAL
  Filled 2013-02-11: qty 1

## 2013-02-11 MED ORDER — DOCUSATE SODIUM 100 MG PO CAPS
200.0000 mg | ORAL_CAPSULE | Freq: Two times a day (BID) | ORAL | Status: DC
  Administered 2013-02-11 – 2013-02-12 (×3): 200 mg via ORAL
  Filled 2013-02-11 (×3): qty 2

## 2013-02-11 NOTE — Progress Notes (Signed)
Physical Therapy Treatment Patient Details Name: Tammy Duke MRN: [id number] DOB: 05-01-1940 Today's Date: 02/11/2013 Time: 4098-1191 PT Time Calculation (min): 23 min  PT Assessment / Plan / Recommendation  History of Present Illness Pt is a 72 y/o female admitted s/p R TKA on 02/10/13.   PT Comments   Pt progressing well with mobility.  Increased ambulation distance.  Needs to focus on achieving terminal knee extension.      Follow Up Recommendations  SNF     Does the patient have the potential to tolerate intense rehabilitation     Barriers to Discharge        Equipment Recommendations  None recommended by PT    Recommendations for Other Services    Frequency 7X/week   Progress towards PT Goals Progress towards PT goals: Progressing toward goals  Plan Current plan remains appropriate    Precautions / Restrictions Precautions Precautions: Fall;Knee Required Braces or Orthoses: Knee Immobilizer - Right Knee Immobilizer - Right: On when out of bed or walking Restrictions RLE Weight Bearing: Weight bearing as tolerated   Pertinent Vitals/Pain 3/10 Rt knee.  Repositioned for comfort.      Mobility  Bed Mobility Bed Mobility: Not assessed Transfers Transfers: Sit to Stand;Stand to Sit Sit to Stand: 4: Min assist;With upper extremity assist;With armrests;From bed;From chair/3-in-1 Stand to Sit: 4: Min guard;With upper extremity assist;With armrests;To bed;To chair/3-in-1 Details for Transfer Assistance: cues for hand placement.  Ambulation/Gait Ambulation/Gait Assistance: 4: Min guard Ambulation Distance (Feet): 50 Feet Assistive device: Rolling walker Ambulation/Gait Assistance Details: Cues for sequencing, RW advancement, Increased heel strike due to pt keeping heel elevated off floor initially, increased WBing through RLE to allow sufficient step with LLE, & increased quad activation RLE during stance.  Gait Pattern: Step-to pattern;Right flexed knee in  stance;Decreased step length - left;Decreased weight shift to right;Trunk flexed Gait velocity: Decreased Stairs: No Wheelchair Mobility Wheelchair Mobility: No    Exercises Total Joint Exercises Ankle Circles/Pumps: AROM;Both;10 reps Quad Sets: AAROM;Right;10 reps (manual facilitation for increased knee extension) Straight Leg Raises: AAROM;Right;10 reps Long Arc Quad: AAROM;Right;10 reps Knee Flexion: AAROM;Right;5 reps;Seated Other Exercises Other Exercises: Passive stretching to Hamstrings RLE while sitting EOB.          PT Goals (current goals can now be found in the care plan section) Acute Rehab PT Goals PT Goal Formulation: With patient Time For Goal Achievement: 02/17/13 Potential to Achieve Goals: Good  Visit Information  Last PT Received On: 02/11/13 Assistance Needed: +1 History of Present Illness: Pt is a 72 y/o female admitted s/p R TKA on 02/10/13.    Subjective Data      Cognition  Cognition Arousal/Alertness: Awake/alert Behavior During Therapy: WFL for tasks assessed/performed Overall Cognitive Status: Within Functional Limits for tasks assessed    Balance     End of Session PT - End of Session Equipment Utilized During Treatment: Gait belt;Right knee immobilizer Activity Tolerance: Patient tolerated treatment well Patient left: in chair;with call bell/phone within reach (pillow under Rt ankle for hamstring stretch) Nurse Communication: Mobility status    GP     Lara Mulch 02/11/2013, 10:02 AM   Verdell Face, PTA [phone] 02/11/2013

## 2013-02-11 NOTE — Evaluation (Signed)
Occupational Therapy Evaluation Patient Details Name: Tammy Duke MRN: [id number] DOB: [birth year] Today's Date: 02/11/2013 Time: 4098-1191 OT Time Calculation (min): 34 min  OT Assessment / Plan / Recommendation History of present illness Pt is a 72 y/o female admitted s/p R TKA on 02/10/13.   Clinical Impression   Pt is 72yo F s/p RTKA who lives alone. PLOF: (I) all areas, has been using RW or "furniture walking" for past month. Pt has good problem solving skills and well motivated to regain fx'l (I). Due to lack of caregiver support, pt requested short term SNF rehab prior to anticipated d/c home alone.     OT Assessment  Patient needs continued OT Services    Follow Up Recommendations  SNF    Barriers to Discharge Decreased caregiver support pt lives alone; noone available to assist upon d/c  Equipment Recommendations   (will likely need shower chair or TTB; SNF to decide)    Recommendations for Other Services    Frequency  Min 2X/week    Precautions / Restrictions Precautions Precautions: Fall;Knee Required Braces or Orthoses: Knee Immobilizer - Right Knee Immobilizer - Right: On when out of bed or walking Restrictions RLE Weight Bearing: Weight bearing as tolerated   Pertinent Vitals/Pain 5/10    ADL  Eating/Feeding: Independent Grooming: Performed;Set up (bed level) Upper Body Dressing: Simulated;Set up;Independent Where Assessed - Upper Body Dressing: Unsupported sitting Lower Body Dressing: Min guard;Simulated (able to don/doff sock w/o assist EOB w/o AE) Where Assessed - Lower Body Dressing: Unsupported sitting    OT Diagnosis: Generalized weakness;Acute pain  OT Problem List: Decreased range of motion;Decreased activity tolerance;Decreased knowledge of use of DME or AE;Pain OT Treatment Interventions: Self-care/ADL training;Energy conservation;DME and/or AE instruction;Therapeutic activities;Patient/family education   OT Goals(Current goals can be  found in the care plan section) Acute Rehab OT Goals Patient Stated Goal: To be able to take care of herself after she gets home from rehab OT Goal Formulation: With patient Time For Goal Achievement: 02/27/13 Potential to Achieve Goals: Good ADL Goals Pt Will Perform Grooming: with supervision (standing at sink) Pt Will Perform Lower Body Dressing: with supervision Pt Will Transfer to Toilet: with supervision Pt Will Perform Toileting - Clothing Manipulation and hygiene: with supervision  Visit Information  Last OT Received On: 02/11/13 Assistance Needed: +1 History of Present Illness: Pt is a 72 y/o female admitted s/p R TKA on 02/10/13.       Prior Functioning     Home Living Family/patient expects to be discharged to:: Skilled nursing facility Living Arrangements: Alone Prior Function Level of Independence: Independent with assistive device(s) (RW vs "furniture walking" for past month) Communication Communication: No difficulties Dominant Hand: Right         Vision/Perception     Cognition  Cognition Arousal/Alertness: Awake/alert Behavior During Therapy: WFL for tasks assessed/performed Overall Cognitive Status: Within Functional Limits for tasks assessed    Extremity/Trunk Assessment Upper Extremity Assessment Upper Extremity Assessment: Overall WFL for tasks assessed Lower Extremity Assessment Lower Extremity Assessment: Defer to PT evaluation Cervical / Trunk Assessment Cervical / Trunk Assessment: Normal     Mobility Bed Mobility Bed Mobility: Not assessed Supine to Sit: HOB flat;4: Min assist Sitting - Scoot to Edge of Bed: 4: Min guard Details for Bed Mobility Assistance: assist with RLE mgmt sup>sit Transfers Sit to Stand: 4: Min assist;With upper extremity assist;With armrests;From bed;From chair/3-in-1 Stand to Sit: 4: Min guard;With upper extremity assist;With armrests;To bed;To chair/3-in-1 Details for Transfer Assistance: cues for  hand  placement.      Exercise     Balance     End of Session CPM Right Knee Additional Comments: Trapeze bar  (encouraged pt to not use; she could likely scoot in bed w/o )  GO     Daishia Fetterly, Deidre Ala 02/11/2013, 1:27 PM

## 2013-02-11 NOTE — Progress Notes (Signed)
Orthopedic Tech Progress Note Patient Details:  Tammy Duke 02/01/1941 [id number]  Patient ID: Tammy Duke, female   DOB: [birth year], 72 y.o.   MRN: [id number] Placed pt's rle in cpm @ 0-65 degrees @ 1500  Tammy Duke 02/11/2013, 2:58 PM

## 2013-02-11 NOTE — Progress Notes (Signed)
Clinical Social Work Department BRIEF PSYCHOSOCIAL ASSESSMENT 02/11/2013  Patient:  MILICENT, ACHEAMPONG     Account Number:  [id number]     Admit date:  02/10/2013  Clinical Social Worker:  Harless Nakayama  Date/Time:  02/11/2013 12:00 N  Referred by:  Physician  Date Referred:  02/11/2013 Referred for  SNF Placement   Other Referral:   Interview type:  Patient Other interview type:    PSYCHOSOCIAL DATA Living Status:  ALONE Admitted from facility:   Level of care:   Primary support name:  Toney Reil [phone] Primary support relationship to patient:  FRIEND Degree of support available:   Pt has friends available for support    CURRENT CONCERNS Current Concerns  Post-Acute Placement   Other Concerns:    SOCIAL WORK ASSESSMENT / PLAN CSW informed pt will need SNF. CSW spoke with pt who informed CSW she has already been in contact with [facility] and would liek to dc there. CSW did explain SNF search process but at this time pt is not wanting to be faxed out anywhere else. CSW to refer pt to [facility].   Assessment/plan status:  Psychosocial Support/Ongoing Assessment of Needs Other assessment/ plan:   Information/referral to community resources:   SNF list denied    PATIENT'S/FAMILY'S RESPONSE TO PLAN OF CARE: Pt agreeable to SNF--only [facility] at this time.       Torah Pinnock, LCSWA [phone]

## 2013-02-11 NOTE — Progress Notes (Signed)
02/11/13 Set up with HHPT with [facility] by MD office. PT/OT recommended SNF. Referral made to CSW. Informed Jalayia Bagheri with [facility] of change to d/c plan. Will continue to follow. Jacquelynn Cree RN, BSN, CCM

## 2013-02-11 NOTE — Progress Notes (Signed)
Subjective: 1 Day Post-Op Procedure(s) (LRB): TOTAL KNEE ARTHROPLASTY (Right) Passing gas- LOC Activity level:  WBAT Diet tolerance:  eating Voiding:  Foley out  Patient reports pain as 2 on 0-10 scale.    Objective: Vital signs in last 24 hours: Temp:  [96.9 F (36.1 C)-98.5 F (36.9 C)] 98.5 F (36.9 C) (11/12 0608) Pulse Rate:  [74-87] 86 (11/12 0608) Resp:  [10-24] 18 (11/12 0608) BP: (109-161)/(60-82) 127/66 mmHg (11/12 0608) SpO2:  [96 %-100 %] 100 % (11/12 0608) Weight:  [74.844 kg (165 lb)] 74.844 kg (165 lb) (11/12 0433)  Labs:  Recent Labs  02/11/13 0447  HGB 9.7*    Recent Labs  02/11/13 0447  WBC 6.3  RBC 2.90*  HCT 27.9*  PLT 172    Recent Labs  02/11/13 0447  NA 131*  K 3.8  CL 95*  CO2 27  BUN 12  CREATININE 0.61  GLUCOSE 100*  CALCIUM 8.0*   No results found for this basename: LABPT, INR,  in the last 72 hours  Physical Exam:  Neurologically intact ABD soft Neurovascular intact Sensation intact distally Intact pulses distally Dorsiflexion/Plantar flexion intact Incision: dressing C/D/I  Assessment/Plan:  1 Day Post-Op Procedure(s) (LRB): TOTAL KNEE ARTHROPLASTY (Right) Advance diet Up with therapy D/C IV fluids Discharge to SNF most likely Thursday for Friday  Continue ASA BID/scd's dressing change today LOC, up  With PT    Tammy Duke 02/11/2013, 7:57 AM

## 2013-02-11 NOTE — Progress Notes (Addendum)
Clinical Social Work Department CLINICAL SOCIAL WORK PLACEMENT NOTE 02/11/2013  Patient:  SHIRLY, BARTOSIEWICZ  Account Number:  [id number] Admit date:  02/10/2013  Clinical Social Worker:  Sharol Harness, Theresia Majors  Date/time:  02/11/2013 01:00 PM  Clinical Social Work is seeking post-discharge placement for this patient at the following level of care:   SKILLED NURSING   (*CSW will update this form in Epic as items are completed)   02/11/2013  Patient/family provided with [facility] Department of Clinical Social Work's list of facilities offering this level of care within the geographic area requested by the patient (or if unable, by the patient's family).  02/11/2013  Patient/family informed of their freedom to choose among providers that offer the needed level of care, that participate in Medicare, Medicaid or managed care program needed by the patient, have an available bed and are willing to accept the patient.  02/11/2013  Patient/family informed of [facility]' ownership interest in [facility], as well as of the fact that they are under no obligation to receive care at this facility.  PASARR submitted to EDS on 02/11/2013 PASARR number received from [employer] on 02/11/2013  FL2 transmitted to all facilities in geographic area requested by pt/family on  02/11/2013 FL2 transmitted to all facilities within larger geographic area on   Patient informed that his/her managed care company has contracts with or will negotiate with  certain facilities, including the following:     Patient/family informed of bed offers received:  02/11/13 Patient chooses bed at [facility] Physician recommends and patient chooses bed at    Patient to be transferred to [facility] on  02/11/13 Patient to be transferred to facility by [facility]  The following physician request were entered in Epic:   Additional Comments:   Laith Antonelli, LCSWA [phone]

## 2013-02-12 LAB — CBC
HCT: 24.7 % — ABNORMAL LOW (ref 36.0–46.0)
MCHC: 34.8 g/dL (ref 30.0–36.0)
MCV: 95.7 fL (ref 78.0–100.0)
Platelets: 145 10*3/uL — ABNORMAL LOW (ref 150–400)
RBC: 2.58 MIL/uL — ABNORMAL LOW (ref 3.87–5.11)
RDW: 13.4 % (ref 11.5–15.5)
WBC: 6.2 10*3/uL (ref 4.0–10.5)

## 2013-02-12 MED ORDER — CARBAMAZEPINE 200 MG PO TABS
300.0000 mg | ORAL_TABLET | Freq: Two times a day (BID) | ORAL | Status: DC
  Administered 2013-02-12: 300 mg via ORAL
  Filled 2013-02-12 (×4): qty 1.5

## 2013-02-12 MED ORDER — ASPIRIN 325 MG PO TBEC: 325.0000 mg | DELAYED_RELEASE_TABLET | Freq: Two times a day (BID) | ORAL | Status: DC

## 2013-02-12 MED ORDER — POLYETHYLENE GLYCOL 3350 17 G PO PACK: 17.0000 g | PACK | Freq: Every day | ORAL | Status: DC | PRN

## 2013-02-12 MED ORDER — OXYCODONE HCL 5 MG PO TABS: 5.0000 mg | ORAL_TABLET | ORAL | Status: DC | PRN

## 2013-02-12 NOTE — Progress Notes (Signed)
Pt has bed available for today if ready for dc. Insurance authorization has been approved.  Urania Pearlman, LCSWA [phone]

## 2013-02-12 NOTE — Progress Notes (Addendum)
Subjective: 2 Days Post-Op Procedure(s) (LRB): TOTAL KNEE ARTHROPLASTY (Right) Right leg dressing changed and wound benign but there is some ecchymosis that is normal postop. No sign of infection. Patient has been working well with therapy. Cleared to go to SNF today. Hemoglobin 8.6 and no symptoms. Activity level:  Weightbearing as tolerated. CPM currently set 0-75. 6-8 hours per day. Advance as tolerated Diet tolerance:  Eating okay Voiding:  Okay Patient reports pain as 3 on 0-10 scale.    Objective: Vital signs in last 24 hours: Temp:  [98.4 F (36.9 C)-101.3 F (38.5 C)] 99.6 F (37.6 C) (11/13 0946) Pulse Rate:  [84-115] 84 (11/13 0946) Resp:  [16-20] 18 (11/13 0946) BP: (132-152)/(66-72) 138/67 mmHg (11/13 0946) SpO2:  [96 %-100 %] 100 % (11/13 0946)  Labs:  Recent Labs  02/11/13 0447 02/12/13 0510  HGB 9.7* 8.6*    Recent Labs  02/11/13 0447 02/12/13 0510  WBC 6.3 6.2  RBC 2.90* 2.58*  HCT 27.9* 24.7*  PLT 172 145*    Recent Labs  02/11/13 0447  NA 131*  K 3.8  CL 95*  CO2 27  BUN 12  CREATININE 0.61  GLUCOSE 100*  CALCIUM 8.0*   No results found for this basename: LABPT, INR,  in the last 72 hours  Physical Exam:  Neurologically intact ABD soft Neurovascular intact Sensation intact distally Intact pulses distally Dorsiflexion/Plantar flexion intact Incision: dressing C/D/I No cellulitis present Compartment soft Leg is ecchymotic from surgery but no sign of infection her active drainage.  Assessment/Plan:  2 Days Post-Op Procedure(s) (LRB): TOTAL KNEE ARTHROPLASTY (Right) Advance diet Up with therapy D/C IV fluids Discharge to SNF Continue ASA 3251 pill twice a day x2 weeks. FL-2 signed on the chart Prescriptions on chart Weight bearing as tolerated, return to office 2 weeks.   Lonnette Shrode R 02/12/2013, 10:59 AM

## 2013-02-12 NOTE — Progress Notes (Signed)
Dressing has been changed from Ace Wrap to Mepilex.

## 2013-02-12 NOTE — Progress Notes (Signed)
CSW (Clinical Child psychotherapist) prepared pt dc packet and placed with shadow chart. CSW has arranged for non-emergent ambulance at 3pm [facility] #[id number]). Pt, pt nurse, and facility all notified. CSW signing off.  Larnie Heart, LCSWA [phone]

## 2013-02-12 NOTE — Progress Notes (Signed)
Patient d/c to [facility] today, IV removed prior, report called and given to nursing staff.

## 2013-02-12 NOTE — Progress Notes (Signed)
Physical Therapy Treatment Patient Details Name: Tammy Duke MRN: [id number] DOB: 1940/08/05 Today's Date: 02/12/2013 Time: 7829-5621 PT Time Calculation (min): 24 min  PT Assessment / Plan / Recommendation  History of Present Illness Pt is a 72 y/o female admitted s/p R TKA on 02/10/13.   PT Comments   Pt cont's to make progress.    Follow Up Recommendations  SNF     Does the patient have the potential to tolerate intense rehabilitation     Barriers to Discharge        Equipment Recommendations  None recommended by PT    Recommendations for Other Services    Frequency 7X/week   Progress towards PT Goals Progress towards PT goals: Progressing toward goals  Plan Current plan remains appropriate    Precautions / Restrictions Precautions Precautions: Fall;Knee Required Braces or Orthoses: Knee Immobilizer - Right Knee Immobilizer - Right: On when out of bed or walking Restrictions Weight Bearing Restrictions: Yes RLE Weight Bearing: Weight bearing as tolerated       Mobility  Bed Mobility Bed Mobility: Sit to Supine Sit to Supine: 4: Min assist;HOB flat Details for Bed Mobility Assistance: (A) to lift LE back into bed Transfers Transfers: Sit to Stand;Stand to Sit Sit to Stand: 4: Min guard;With upper extremity assist;With armrests;From chair/3-in-1 Stand to Sit: 4: Min guard;With upper extremity assist;To bed Details for Transfer Assistance: cues to square hips up with bed before sitting.   Ambulation/Gait Ambulation/Gait Assistance: 4: Min guard Ambulation Distance (Feet): 50 Feet Assistive device: Rolling walker Ambulation/Gait Assistance Details: cues for sequencing, heel strike RLE, terminal knee extension during stance, not to "hop" when advancing LLE.  Pt cont's to have difficulty achieving terminal knee extension RLE (knee remains bent despite KI) Gait Pattern: Step-to pattern;Decreased weight shift to right;Decreased step length - left;Decreased step  length - right;Trunk flexed Gait velocity: Decreased Stairs: No      PT Goals (current goals can now be found in the care plan section) Acute Rehab PT Goals PT Goal Formulation: With patient Time For Goal Achievement: 02/17/13 Potential to Achieve Goals: Good  Visit Information  Last PT Received On: 02/12/13 Assistance Needed: +1 History of Present Illness: Pt is a 72 y/o female admitted s/p R TKA on 02/10/13.    Subjective Data      Cognition  Cognition Arousal/Alertness: Awake/alert Behavior During Therapy: WFL for tasks assessed/performed Overall Cognitive Status: Within Functional Limits for tasks assessed    Balance     End of Session PT - End of Session Equipment Utilized During Treatment: Gait belt;Right knee immobilizer Activity Tolerance: Patient tolerated treatment well Patient left: in bed;in CPM;with call bell/phone within reach Nurse Communication: Mobility status CPM Right Knee CPM Right Knee: On   GP     Lara Mulch 02/12/2013, 11:55 AM  Verdell Face, PTA [phone] 02/12/2013

## 2013-02-12 NOTE — Discharge Summary (Signed)
Patient ID: Tammy Duke MRN: [id number] DOB/AGE: 1941/02/27 72 y.o.  Admit date: 02/10/2013 Discharge date: 02/12/2013  Admission Diagnoses:  Principal Problem:   Right knee DJD   Discharge Diagnoses:  Same  Past Medical History  Diagnosis Date  . GOITER, MULTINODULAR 05/04/2009  . HYPOTHYROIDISM 04/21/2009  . HYPERLIPIDEMIA 04/21/2009  . HYPERTENSION 04/21/2009  . ALLERGIC RHINITIS 04/21/2009  . SYNCOPE 01/02/2010  . SEIZURE DISORDER 04/21/2009  . FATIGUE 04/21/2009  . THYROID CANCER, HX OF 04/21/2009  . Osteoporosis 10/28/2011  . Environmental allergies   . Degenerative arthritis of right knee 09/24/2012  . Cancer     thyroid    Surgeries: Procedure(s): TOTAL KNEE ARTHROPLASTY on 02/10/2013   Consultants:    Discharged Condition: Improved  Hospital Course: Tammy Duke is an 72 y.o. female who was admitted 02/10/2013 for operative treatment ofRight knee DJD. Patient has severe unremitting pain that affects sleep, daily activities, and work/hobbies. After pre-op clearance the patient was taken to the operating room on 02/10/2013 and underwent  Procedure(s): TOTAL KNEE ARTHROPLASTY.    Patient was given perioperative antibiotics: Anti-infectives   Start     Dose/Rate Route Frequency Ordered Stop   02/10/13 1130  ceFAZolin (ANCEF) IVPB 2 g/50 mL premix     2 g 100 mL/hr over 30 Minutes Intravenous Every 6 hours 02/10/13 1117 02/10/13 2329   02/10/13 0600  ceFAZolin (ANCEF) IVPB 2 g/50 mL premix     2 g 100 mL/hr over 30 Minutes Intravenous On call to O.R. 02/09/13 1433 02/10/13 0740       Patient was given sequential compression devices, early ambulation, and chemoprophylaxis to prevent DVT.  Patient benefited maximally from hospital stay and there were no complications.    Recent vital signs: Patient Vitals for the past 24 hrs:  BP Temp Temp src Pulse Resp SpO2  02/12/13 0946 138/67 mmHg 99.6 F (37.6 C) - 84 18 100 %  02/12/13 0509 132/69 mmHg 98.4 F  (36.9 C) - 92 16 97 %  02/11/13 2306 - 98.7 F (37.1 C) Oral - - -  02/11/13 2106 152/66 mmHg 101.3 F (38.5 C) - 102 18 100 %  02/11/13 1432 138/72 mmHg 98.5 F (36.9 C) - 115 20 96 %     Recent laboratory studies:  Recent Labs  02/11/13 0447 02/12/13 0510  WBC 6.3 6.2  HGB 9.7* 8.6*  HCT 27.9* 24.7*  PLT 172 145*  NA 131*  --   K 3.8  --   CL 95*  --   CO2 27  --   BUN 12  --   CREATININE 0.61  --   GLUCOSE 100*  --   CALCIUM 8.0*  --      Discharge Medications:     Medication List    STOP taking these medications       meloxicam 15 MG tablet  Commonly known as:  MOBIC      TAKE these medications       acetaminophen 500 MG tablet  Commonly known as:  TYLENOL  Take 1,000 mg by mouth every 6 (six) hours as needed for pain.     aspirin 325 MG EC tablet  Take 1 tablet (325 mg total) by mouth 2 (two) times daily after a meal.     CALTRATE 600+D 600-400 MG-UNIT per tablet  Generic drug:  Calcium Carbonate-Vitamin D  Take 1 tablet by mouth 2 (two) times daily.     carbamazepine 200 MG tablet  Commonly  known as:  TEGRETOL  Take 200-300 mg by mouth 4 (four) times daily. Takes 1 and 1/2 tablets in am and pm, 1 tablet at lunch and dinner     cyclobenzaprine 5 MG tablet  Commonly known as:  FLEXERIL  Take 1 tablet (5 mg total) by mouth 3 (three) times daily as needed for muscle spasms.     levothyroxine 100 MCG tablet  Commonly known as:  SYNTHROID, LEVOTHROID  Take 1 tablet (100 mcg total) by mouth every morning.     losartan 50 MG tablet  Commonly known as:  COZAAR  Take 50 mg by mouth daily.     Magnesium 250 MG Tabs  Take by mouth daily.     oxyCODONE 5 MG immediate release tablet  Commonly known as:  Oxy IR/ROXICODONE  Take 1-2 tablets (5-10 mg total) by mouth every 3 (three) hours as needed for breakthrough pain.     polyethylene glycol packet  Commonly known as:  MIRALAX / GLYCOLAX  Take 17 g by mouth daily as needed (constipation).      vitamin C 500 MG tablet  Commonly known as:  ASCORBIC ACID  Take 250 mg by mouth daily.       continue ASA 325 one twice a day for 2 weeks postop. Continue CPM machine 0-76 hours a day and advance as tolerated. May change dressing as needed. Continue ice and elevation to help with any swelling or ecchymosis.  Diagnostic Studies: Dg Chest 2 View  02/02/2013   CLINICAL DATA:  Preop for right knee arthroplasty  EXAM: CHEST  2 VIEW  COMPARISON:  01/28/2012  FINDINGS: Cardiomediastinal silhouette is stable. Mild hyperinflation again noted. No acute infiltrate or pleural effusion. No pulmonary edema. Old left upper rib fractures.  IMPRESSION: No active disease. Mild hyperinflation.   Electronically Signed   By: Natasha Mead M.D.   On: 02/02/2013 10:56    Disposition: 01-Home or Self Care      Discharge Orders   Future Appointments Provider Department Dept Phone   03/11/2013 9:00 AM Corwin Levins, MD [facility] [phone]   03/13/2013 7:45 AM Sherrie George, MD [facility] [phone]   Future Orders Complete By Expires   Call MD / Call 911  As directed    Comments:     If you experience chest pain or shortness of breath, CALL 911 and be transported to the hospital emergency room.  If you develope a fever above 101 F, pus (white drainage) or increased drainage or redness at the wound, or calf pain, call your surgeon's office.   Constipation Prevention  As directed    Comments:     Drink plenty of fluids.  Prune juice may be helpful.  You may use a stool softener, such as Colace (over the counter) 100 mg twice a day.  Use MiraLax (over the counter) for constipation as needed.   Diet - low sodium heart healthy  As directed    Increase activity slowly as tolerated  As directed       Follow-up Information   Follow up with DALLDORF,PETER G, MD. Call in 2 weeks.   Specialty:  Orthopedic Surgery   Contact information:   [address]  [address] [place] [address] [phone]        Signed: Prince Rome 02/12/2013, 11:06 AM

## 2013-02-18 ENCOUNTER — Non-Acute Institutional Stay (SKILLED_NURSING_FACILITY): Payer: Medicare Other | Admitting: Internal Medicine

## 2013-02-18 DIAGNOSIS — E039 Hypothyroidism, unspecified: Secondary | ICD-10-CM

## 2013-02-18 DIAGNOSIS — M1710 Unilateral primary osteoarthritis, unspecified knee: Secondary | ICD-10-CM

## 2013-02-18 DIAGNOSIS — I10 Essential (primary) hypertension: Secondary | ICD-10-CM

## 2013-02-18 DIAGNOSIS — M1711 Unilateral primary osteoarthritis, right knee: Secondary | ICD-10-CM

## 2013-02-18 DIAGNOSIS — R569 Unspecified convulsions: Secondary | ICD-10-CM

## 2013-03-11 ENCOUNTER — Encounter: Payer: Self-pay | Admitting: Internal Medicine

## 2013-03-11 ENCOUNTER — Ambulatory Visit (INDEPENDENT_AMBULATORY_CARE_PROVIDER_SITE_OTHER): Payer: Medicare Other | Admitting: Internal Medicine

## 2013-03-11 VITALS — BP 142/80 | HR 91 | Temp 97.3°F | Wt 161.0 lb

## 2013-03-11 DIAGNOSIS — M1710 Unilateral primary osteoarthritis, unspecified knee: Secondary | ICD-10-CM

## 2013-03-11 DIAGNOSIS — Z0000 Encounter for general adult medical examination without abnormal findings: Secondary | ICD-10-CM

## 2013-03-11 DIAGNOSIS — M1711 Unilateral primary osteoarthritis, right knee: Secondary | ICD-10-CM

## 2013-03-11 DIAGNOSIS — IMO0002 Reserved for concepts with insufficient information to code with codable children: Secondary | ICD-10-CM

## 2013-03-11 DIAGNOSIS — E039 Hypothyroidism, unspecified: Secondary | ICD-10-CM

## 2013-03-11 DIAGNOSIS — E785 Hyperlipidemia, unspecified: Secondary | ICD-10-CM

## 2013-03-11 DIAGNOSIS — I10 Essential (primary) hypertension: Secondary | ICD-10-CM

## 2013-03-11 DIAGNOSIS — Z23 Encounter for immunization: Secondary | ICD-10-CM

## 2013-03-11 NOTE — Assessment & Plan Note (Signed)
stable overall by history and exam, recent data reviewed with pt, and pt to continue medical treatment as before,  to f/u any worsening symptoms or concerns Lab Results  Component Value Date   TSH 0.92 09/05/2012

## 2013-03-11 NOTE — Assessment & Plan Note (Signed)
Doing remarkably well post op/post PT, walking with walker today without pain or instability,  to f/u any worsening symptoms or concerns

## 2013-03-11 NOTE — Assessment & Plan Note (Signed)
stable overall by history and exam, recent data reviewed with pt, and pt to continue medical treatment as before,  to f/u any worsening symptoms or concerns Lab Results  Component Value Date   LDLCALC 90 09/05/2012

## 2013-03-11 NOTE — Addendum Note (Signed)
Addended by: Scharlene Gloss B on: 03/11/2013 09:44 AM   Modules accepted: Orders

## 2013-03-11 NOTE — Assessment & Plan Note (Signed)
stable overall by history and exam, recent data reviewed with pt, and pt to continue medical treatment as before,  to f/u any worsening symptoms or concerns BP Readings from Last 3 Encounters:  03/11/13 142/80  02/12/13 148/76  02/12/13 148/76

## 2013-03-11 NOTE — Progress Notes (Signed)
Subjective:    Patient ID: Tammy Duke, female    DOB: [birth year], 72 y.o.   MRN: [id number]  HPI    Here to f/u; overall doing ok,  Pt denies chest pain, increased sob or doe, wheezing, orthopnea, PND, increased LE swelling, palpitations, dizziness or syncope.  Pt denies polydipsia, polyuria, or low sugar symptoms such as weakness or confusion improved with po intake.  Pt denies new neurological symptoms such as new headache, or facial or extremity weakness or numbness.   Pt states overall good compliance with meds, has been trying to follow lower cholesterol diet, with wt overall stable.  S/p right knee TKR, little to no pain, doing ok on tramadol after 2wks post surgury inpt rehab with pain med per pt more important actually today for the left lower back pain that seems to have flared up with PT for her knee. Using robaxin as well. Not using mobic for now, may need later for other arthritis.  Has fu ortho next wed dec 17.  Pt denies fever, wt loss, night sweats, loss of appetite, or other constitutional symptoms  Denies worsening depressive symptoms, suicidal ideation, or panic; has ongoing anxiety, not increased recently. Had recent uti - Denies urinary symptoms such as dysuria, frequency, urgency, flank pain, hematuria or n/v, fever, chills.  Denies hyper or hypo thyroid symptoms such as voice, skin or hair change.  Past Medical History  Diagnosis Date  . GOITER, MULTINODULAR 05/04/2009  . HYPOTHYROIDISM 04/21/2009  . HYPERLIPIDEMIA 04/21/2009  . HYPERTENSION 04/21/2009  . ALLERGIC RHINITIS 04/21/2009  . SYNCOPE 01/02/2010  . SEIZURE DISORDER 04/21/2009  . FATIGUE 04/21/2009  . THYROID CANCER, HX OF 04/21/2009  . Osteoporosis 10/28/2011  . Environmental allergies   . Degenerative arthritis of right knee 09/24/2012  . Cancer     thyroid   Past Surgical History  Procedure Laterality Date  . S/p left radius fracture surgury  2001    Dr. Jerl Santos, Ortho.  . S/p thyroid surgury for cancer   1985    biopsy  . Hx of right retenal detachment   Sept. 2005  . S/p right cataract with lens implant    . Left radus    . Laparoscopic sigmoid colectomy  01/30/2012    Procedure: LAPAROSCOPIC SIGMOID COLECTOMY;  Surgeon: Valarie Merino, MD;  Location: [facility];  Service: General;  Laterality: N/A;  Lap Assisted Sigmoid Colectomy with Takedown of Colovesicle Fistula  . Take down of intestinal fistula  01/30/2012    Procedure: TAKE DOWN OF INTESTINAL FISTULA;  Surgeon: Valarie Merino, MD;  Location: [facility];  Service: General;  Laterality: N/A;  . Vesico-vaginal fistula repair  01/30/2012    Procedure: FISTULA REPAIR VESICO-VAGINAL;  Surgeon: Martina Sinner, MD;  Location: [facility];  Service: Urology;  Laterality: N/A;  Enterovesical Fistula Repair  . Orif wrist fracture Left 2003    DR Alphonsa Gin [facility]. [phone]  . Eye surgery    . Colon surgery    . Total knee arthroplasty Right 02/10/2013    Procedure: TOTAL KNEE ARTHROPLASTY;  Surgeon: Velna Ochs, MD;  Location: [facility];  Service: Orthopedics;  Laterality: Right;    reports that she has quit smoking. She has never used smokeless tobacco. She reports that she does not drink alcohol or use illicit drugs. family history includes Bipolar disorder in her brother and sister; Goiter in her mother; Heart attack in her brother; Heart disease in her father and mother; Seizures in  her other; Stroke in her father. Allergies  Allergen Reactions  . Sulfonamide Derivatives Anaphylaxis    caused heart to stop as child  . Erythromycin     Interacts with seizure medication  . Latex Rash   Current Outpatient Prescriptions on File Prior to Visit  Medication Sig Dispense Refill  . acetaminophen (TYLENOL) 500 MG tablet Take 1,000 mg by mouth every 6 (six) hours as needed for pain.      Marland Kitchen aspirin EC 325 MG EC tablet Take 1 tablet (325 mg total) by mouth 2 (two) times daily after a meal.  30 tablet  0  . Calcium Carbonate-Vitamin D  (CALTRATE 600+D) 600-400 MG-UNIT per tablet Take 1 tablet by mouth 2 (two) times daily.        . carbamazepine (TEGRETOL) 200 MG tablet Take 200-300 mg by mouth 4 (four) times daily. Takes 1 and 1/2 tablets in am and pm, 1 tablet at lunch and dinner      . levothyroxine (SYNTHROID, LEVOTHROID) 100 MCG tablet Take 1 tablet (100 mcg total) by mouth every morning.  90 tablet  3  . losartan (COZAAR) 50 MG tablet Take 50 mg by mouth daily.      . Magnesium 250 MG TABS Take by mouth daily.        Marland Kitchen oxyCODONE (OXY IR/ROXICODONE) 5 MG immediate release tablet Take 1-2 tablets (5-10 mg total) by mouth every 3 (three) hours as needed for breakthrough pain.  70 tablet  0  . polyethylene glycol (MIRALAX / GLYCOLAX) packet Take 17 g by mouth daily as needed (constipation).  14 each  0  . vitamin C (ASCORBIC ACID) 500 MG tablet Take 250 mg by mouth daily.        No current facility-administered medications on file prior to visit.     Review of Systems  Constitutional: Negative for unexpected weight change, or unusual diaphoresis  HENT: Negative for tinnitus.   Eyes: Negative for photophobia and visual disturbance.  Respiratory: Negative for choking and stridor.   Gastrointestinal: Negative for vomiting and blood in stool.  Genitourinary: Negative for hematuria and decreased urine volume.  Musculoskeletal: Negative for acute joint swelling Skin: Negative for color change and wound.  Neurological: Negative for tremors and numbness other than noted  Psychiatric/Behavioral: Negative for decreased concentration or  hyperactivity.       Objective:   Physical Exam BP 142/80  Pulse 91  Temp(Src) 97.3 F (36.3 C) (Oral)  Wt 161 lb (73.029 kg)  SpO2 95% VS noted,  Constitutional: Pt appears well-developed and well-nourished.  HENT: Head: NCAT.  Right Ear: External ear normal.  Left Ear: External ear normal.  Eyes: Conjunctivae and EOM are normal. Pupils are equal, round, and reactive to light.  Neck:  Normal range of motion. Neck supple.  Cardiovascular: Normal rate and regular rhythm.   Pulmonary/Chest: Effort normal and breath sounds normal.  Abd:  Soft, NT, non-distended, + BS Neurological: Pt is alert. Not confused  Skin: Skin is warm. No erythema.  Psychiatric: Pt behavior is normal. Thought content normal.  Right knee post op without sign cellulitis, wound intake, minor swelling    Assessment & Plan:

## 2013-03-11 NOTE — Patient Instructions (Signed)
You had the new Prevnar pneumonia shot today Please continue all other medications as before, and refills have been done if requested. Please have the pharmacy call with any other refills you may need. Please keep your appointments with your specialists as you have planned - orthopedic  Please remember to sign up for My Chart if you have not done so, as this will be important to you in the future with finding out test results, communicating by private email, and scheduling acute appointments online when needed.  Please return in 6 months, or sooner if needed, with Lab testing done 3-5 days before

## 2013-03-13 ENCOUNTER — Ambulatory Visit (INDEPENDENT_AMBULATORY_CARE_PROVIDER_SITE_OTHER): Payer: Medicare Other | Admitting: Ophthalmology

## 2013-03-13 DIAGNOSIS — I10 Essential (primary) hypertension: Secondary | ICD-10-CM

## 2013-03-13 DIAGNOSIS — H35039 Hypertensive retinopathy, unspecified eye: Secondary | ICD-10-CM

## 2013-03-13 DIAGNOSIS — H33309 Unspecified retinal break, unspecified eye: Secondary | ICD-10-CM

## 2013-03-13 DIAGNOSIS — H348392 Tributary (branch) retinal vein occlusion, unspecified eye, stable: Secondary | ICD-10-CM

## 2013-03-13 DIAGNOSIS — H2510 Age-related nuclear cataract, unspecified eye: Secondary | ICD-10-CM

## 2013-03-13 DIAGNOSIS — H43819 Vitreous degeneration, unspecified eye: Secondary | ICD-10-CM

## 2013-03-20 ENCOUNTER — Telehealth: Payer: Self-pay | Admitting: Internal Medicine

## 2013-03-20 NOTE — Telephone Encounter (Signed)
Recd records from [facility]., Forwarding 2pgs to Dr.John °

## 2013-04-06 ENCOUNTER — Telehealth: Payer: Self-pay | Admitting: *Deleted

## 2013-04-06 NOTE — Telephone Encounter (Signed)
Pt called requesting Flexeril refill.  Medication not on med list.  Please advise

## 2013-04-07 ENCOUNTER — Other Ambulatory Visit: Payer: Self-pay | Admitting: Internal Medicine

## 2013-04-07 ENCOUNTER — Encounter: Payer: Self-pay | Admitting: Internal Medicine

## 2013-04-07 MED ORDER — CYCLOBENZAPRINE HCL 5 MG PO TABS: 5.0000 mg | ORAL_TABLET | Freq: Three times a day (TID) | ORAL | Status: DC | PRN

## 2013-04-07 NOTE — Telephone Encounter (Signed)
Patient requests that pharmacy be changed to [facility]....request made in system & patient notified that MD sent flexeril script in.  She verbalizes understanding.

## 2013-04-07 NOTE — Progress Notes (Signed)
Patient ID: Tammy Duke, female   DOB: December 26, 1940, 73 y.o.   MRN: [id number]        HISTORY & PHYSICAL  DATE: 02/18/2013     FACILITY: [facility]  LEVEL OF CARE: SNF (31)  ALLERGIES:  Allergies  Allergen Reactions  . Sulfonamide Derivatives Anaphylaxis    caused heart to stop as child  . Erythromycin     Interacts with seizure medication  . Latex Rash    CHIEF COMPLAINT:  Manage right knee osteoarthritis, hypothyroidism, and seizure disorder.    HISTORY OF PRESENT ILLNESS:  The patient is a 73 year-old, Caucasian female.    KNEE OSTEOARTHRITIS: Patient had a history of pain and functional disability in the knee due to end-stage osteoarthritis and has failed nonsurgical conservative treatments. Patient had worsening of pain with activity and weight bearing, pain that interfered with activities of daily living & pain with passive range of motion. Therefore patient underwent total knee arthroplasty and tolerated the procedure well. Patient is admitted to this facility for sort short-term rehabilitation. Patient denies knee pain.    HYPOTHYROIDISM: The hypothyroidism remains stable. No complications noted from the medications presently being used.  The patient denies fatigue or constipation.  Last TSH:  Not available.      SEIZURE DISORDER: The patient's seizure disorder remains stable. No complications reported from the medications presently being used. Staff do not report any recent seizure activity.    PAST MEDICAL HISTORY :  Past Medical History  Diagnosis Date  . GOITER, MULTINODULAR 05/04/2009  . HYPOTHYROIDISM 04/21/2009  . HYPERLIPIDEMIA 04/21/2009  . HYPERTENSION 04/21/2009  . ALLERGIC RHINITIS 04/21/2009  . SYNCOPE 01/02/2010  . SEIZURE DISORDER 04/21/2009  . FATIGUE 04/21/2009  . THYROID CANCER, HX OF 04/21/2009  . Osteoporosis 10/28/2011  . Environmental allergies   . Degenerative arthritis of right knee 09/24/2012  . Cancer     thyroid    PAST  SURGICAL HISTORY: Past Surgical History  Procedure Laterality Date  . S/p left radius fracture surgury  2001    Dr. Rhona Raider, Ortho.  . S/p thyroid surgury for cancer  1985    biopsy  . Hx of right retenal detachment   Sept. 2005  . S/p right cataract with lens implant    . Left radus    . Laparoscopic sigmoid colectomy  01/30/2012    Procedure: LAPAROSCOPIC SIGMOID COLECTOMY;  Surgeon: Pedro Earls, MD;  Location: [facility];  Service: General;  Laterality: N/A;  Lap Assisted Sigmoid Colectomy with Takedown of Colovesicle Fistula  . Take down of intestinal fistula  01/30/2012    Procedure: TAKE DOWN OF INTESTINAL FISTULA;  Surgeon: Pedro Earls, MD;  Location: [facility];  Service: General;  Laterality: N/A;  . Vesico-vaginal fistula repair  01/30/2012    Procedure: FISTULA REPAIR VESICO-VAGINAL;  Surgeon: Reece Packer, MD;  Location: [facility];  Service: Urology;  Laterality: N/A;  Enterovesical Fistula Repair  . Orif wrist fracture Left 2003    DR Judd Lien [facility]. [phone]  . Eye surgery    . Colon surgery    . Total knee arthroplasty Right 02/10/2013    Procedure: TOTAL KNEE ARTHROPLASTY;  Surgeon: Hessie Dibble, MD;  Location: [place];  Service: Orthopedics;  Laterality: Right;    SOCIAL HISTORY:  reports that she has quit smoking. She has never used smokeless tobacco. She reports that she does not drink alcohol or use illicit drugs.  FAMILY HISTORY:  Family  History  Problem Relation Age of Onset  . Heart disease Mother   . Goiter Mother     mother had i-131 rx for a benign goiter  . Heart disease Father   . Stroke Father   . Bipolar disorder Sister   . Heart attack Brother   . Bipolar disorder Brother   . Seizures Other     Strong family history of seizures, 2 brothers and 2 sisters    CURRENT MEDICATIONS: Reviewed per [facility]  REVIEW OF SYSTEMS:  See HPI otherwise 14 point ROS is negative.  PHYSICAL EXAMINATION  VS:  T 97.2       P 96      RR 18      BP  163/83      POX 96%        WT (Lb)  GENERAL: no acute distress, normal body habitus EYES: conjunctivae normal, sclerae normal, normal eye lids MOUTH/THROAT: lips without lesions,no lesions in the mouth,tongue is without lesions,uvula elevates in midline NECK: supple, trachea midline, no neck masses, no thyroid tenderness, no thyromegaly LYMPHATICS: no LAN in the neck, no supraclavicular LAN RESPIRATORY: breathing is even & unlabored, BS CTAB CARDIAC: RRR, no murmur,no extra heart sounds, no edema GI:  ABDOMEN: abdomen soft, normal BS, no masses, no tenderness  LIVER/SPLEEN: no hepatomegaly, no splenomegaly MUSCULOSKELETAL: HEAD: normal to inspection & palpation BACK: no kyphosis, scoliosis or spinal processes tenderness EXTREMITIES: LEFT UPPER EXTREMITY: full range of motion, normal strength & tone RIGHT UPPER EXTREMITY:  full range of motion, normal strength & tone LEFT LOWER EXTREMITY:  full range of motion, normal strength & tone RIGHT LOWER EXTREMITY: strength intact, range of motion not tested due to surgery   PSYCHIATRIC: the patient is alert & oriented to person, affect & behavior appropriate  LABS/RADIOLOGY: Chest x-ray:  No acute disease.    Labs reviewed: Basic Metabolic Panel:  Recent Labs  09/05/12 0731 02/02/13 0921 02/11/13 0447  NA 128* 130* 131*  K 4.0 3.9 3.8  CL 92* 94* 95*  CO2 25 28 27   GLUCOSE 88 85 100*  BUN 9 10 12   CREATININE 0.6 0.65 0.61  CALCIUM 8.9 9.0 8.0*   Liver Function Tests:  Recent Labs  09/05/12 0731  AST 20  ALT 20  ALKPHOS 105  BILITOT 0.7  PROT 6.8  ALBUMIN 4.2   CBC:  Recent Labs  09/05/12 0731 02/02/13 0921 02/11/13 0447 02/12/13 0510  WBC 4.8 5.7 6.3 6.2  NEUTROABS 3.5 3.8  --   --   HGB 13.9 14.1 9.7* 8.6*  HCT 40.7 39.3 27.9* 24.7*  MCV 97.6 94.7 96.2 95.7  PLT 284.0 261 172 145*   Lipid Panel:  Recent Labs  09/05/12 0731  HDL 85.90    ASSESSMENT/PLAN:  Right knee osteoarthritis.  Status post  right total knee arthroplasty.  Continue rehabilitation.    Hypothyroidism.  Continue levothyroxine.    Seizure disorder.  Well controlled.    Hypertension.  Blood pressure elevated.  We will review a log.    Check CBC, CMP, and Tegretol level.    [facility] Metrics:   Aspirin 325 mg.    I have reviewed patient's medical records received at admission/from hospitalization.  CPT CODE: 70350

## 2013-04-07 NOTE — Telephone Encounter (Signed)
Done erx 

## 2013-04-15 ENCOUNTER — Other Ambulatory Visit: Payer: Self-pay | Admitting: Internal Medicine

## 2013-04-15 NOTE — Telephone Encounter (Signed)
Done erx 

## 2013-04-24 ENCOUNTER — Telehealth: Payer: Self-pay | Admitting: Internal Medicine

## 2013-04-24 NOTE — Telephone Encounter (Signed)
Rec'd from [facility] forward 2 pages to Dr.James

## 2013-04-29 ENCOUNTER — Other Ambulatory Visit: Payer: Self-pay

## 2013-04-29 MED ORDER — LEVOTHYROXINE SODIUM 100 MCG PO TABS: 100.0000 ug | ORAL_TABLET | Freq: Every morning | ORAL | Status: DC

## 2013-06-04 ENCOUNTER — Ambulatory Visit (INDEPENDENT_AMBULATORY_CARE_PROVIDER_SITE_OTHER): Payer: Medicare Other | Admitting: Surgery

## 2013-06-04 ENCOUNTER — Encounter (INDEPENDENT_AMBULATORY_CARE_PROVIDER_SITE_OTHER): Payer: Self-pay

## 2013-06-04 ENCOUNTER — Telehealth: Payer: Self-pay | Admitting: Internal Medicine

## 2013-06-04 ENCOUNTER — Encounter (INDEPENDENT_AMBULATORY_CARE_PROVIDER_SITE_OTHER): Payer: Self-pay | Admitting: Surgery

## 2013-06-04 VITALS — BP 118/82 | HR 71 | Temp 97.0°F | Resp 16 | Ht 71.0 in | Wt 149.6 lb

## 2013-06-04 DIAGNOSIS — T8183XA Persistent postprocedural fistula, initial encounter: Secondary | ICD-10-CM

## 2013-06-04 NOTE — Progress Notes (Signed)
Tammy Duke 73 y.o.  Body mass index is 20.87 kg/(m^2).  Patient Active Problem List   Diagnosis Date Noted  . 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 Degenerative arthritis of right knee 09/24/2012  . Urinary tract infection, site not specified 01/21/2012  . Osteoporosis 10/28/2011  . Menopausal syndrome 09/06/2011  . Preventative health care 08/13/2010  . SYNCOPE 01/02/2010  . GOITER, MULTINODULAR 05/04/2009  . HYPOTHYROIDISM 04/21/2009  . HYPERLIPIDEMIA 04/21/2009  . HYPERTENSION 04/21/2009  . ALLERGIC RHINITIS 04/21/2009  . SEIZURE DISORDER 04/21/2009    Allergies  Allergen Reactions  . Sulfonamide Derivatives Anaphylaxis    caused heart to stop as child  . Erythromycin     Interacts with seizure medication  . Latex Rash    Past Surgical History  Procedure Laterality Date  . S/p left radius fracture surgury  2001    Dr. Rhona Raider, Ortho.  . S/p thyroid surgury for cancer  1985    biopsy  . Hx of right retenal detachment   Sept. 2005  . S/p right cataract with lens implant    . Left radus    . Laparoscopic sigmoid colectomy  01/30/2012    Procedure: LAPAROSCOPIC SIGMOID COLECTOMY;  Surgeon: Pedro Earls, MD;  Location: [facility];  Service: General;  Laterality: N/A;  Lap Assisted Sigmoid Colectomy with Takedown of Colovesicle Fistula  . Take down of intestinal fistula  01/30/2012    Procedure: TAKE DOWN OF INTESTINAL FISTULA;  Surgeon: Pedro Earls, MD;  Location: [facility];  Service: General;  Laterality: N/A;  . Vesico-vaginal fistula repair  01/30/2012    Procedure: FISTULA REPAIR VESICO-VAGINAL;  Surgeon: Reece Packer, MD;  Location: [facility];  Service: Urology;  Laterality: N/A;  Enterovesical Fistula Repair  . Orif wrist fracture Left 2003    DR Judd Lien [facility]. [phone]  . Eye surgery    . Colon surgery    . Total knee arthroplasty Right 02/10/2013    Procedure: TOTAL KNEE ARTHROPLASTY;  Surgeon: Hessie Dibble, MD;  Location:  [place];  Service: Orthopedics;  Laterality: Right;   Cathlean Cower, MD 1. 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  Will get air contast BE.  See back after study to make plans.   Matt B. Hassell Done, MD, [facility] [phone] beeper [phone]  06/04/2013 11:13 AM

## 2013-06-04 NOTE — Telephone Encounter (Signed)
Rec'd from [place] forward 2 pages to Dr.Brodie

## 2013-06-05 ENCOUNTER — Telehealth (INDEPENDENT_AMBULATORY_CARE_PROVIDER_SITE_OTHER): Payer: Self-pay | Admitting: *Deleted

## 2013-06-05 NOTE — Telephone Encounter (Signed)
I spoke with pt and informed her of the appt for her barium enema at [facility] on 06/12/13 with an arrival time of 9:15am.  I informed her to go by GI prior to Thursday 06/11/13 to pick up her prep kit.  She is agreeable with this appt and information given.

## 2013-06-12 ENCOUNTER — Other Ambulatory Visit (INDEPENDENT_AMBULATORY_CARE_PROVIDER_SITE_OTHER): Payer: Self-pay | Admitting: Surgery

## 2013-06-12 ENCOUNTER — Ambulatory Visit
Admission: RE | Admit: 2013-06-12 | Discharge: 2013-06-12 | Disposition: A | Discharge status: Home / Self Care | Payer: Medicare Other | Source: Ambulatory Visit | Attending: Surgery | Admitting: Surgery

## 2013-06-12 DIAGNOSIS — T8183XA Persistent postprocedural fistula, initial encounter: Secondary | ICD-10-CM

## 2013-06-19 ENCOUNTER — Telehealth (INDEPENDENT_AMBULATORY_CARE_PROVIDER_SITE_OTHER): Payer: Self-pay

## 2013-06-19 NOTE — Telephone Encounter (Signed)
Barium enema results faxed to Dr. Ila Mcgill @ [facility] [phone] fax confirmation rec'd and attached to outgoing fax.

## 2013-06-19 NOTE — Telephone Encounter (Signed)
Called and spoke to patient to scheduled follow up appointment with Dr. Hassell Done to discuss imaging results for 07/01/13 @ 10:00 am.

## 2013-07-01 ENCOUNTER — Encounter (INDEPENDENT_AMBULATORY_CARE_PROVIDER_SITE_OTHER): Payer: Self-pay | Admitting: Surgery

## 2013-07-01 ENCOUNTER — Ambulatory Visit (INDEPENDENT_AMBULATORY_CARE_PROVIDER_SITE_OTHER): Payer: Medicare Other | Admitting: Surgery

## 2013-07-01 VITALS — BP 122/76 | HR 75 | Temp 98.4°F | Resp 14 | Ht 71.0 in | Wt 145.6 lb

## 2013-07-01 DIAGNOSIS — N321 Vesicointestinal fistula: Secondary | ICD-10-CM

## 2013-07-01 NOTE — Progress Notes (Signed)
Chief Complaint:  Recurrent colovesical fistula  History of Present Illness:  Tammy Duke is an 73 y.o. female who underwent takedown of a colovesical fistula by me back in 2013. Scott McDermott closed her bladder flap and Greer Pickerel help me to the procedure which entailed a sigmoid resection and a shoulder take her type handsewn anastomosis. I did put some Seprafilm into the wound postop.  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 It appears to be above the anastomosis area this will require takedown as before but with a more extensive excision of her left colon with mobilization of the splenic flexure. I have explained this to her and we will go ahead and get scheduled to [facility].  Past Medical History  Diagnosis Date  . GOITER, MULTINODULAR 05/04/2009  . HYPOTHYROIDISM 04/21/2009  . HYPERLIPIDEMIA 04/21/2009  . HYPERTENSION 04/21/2009  . ALLERGIC RHINITIS 04/21/2009  . SYNCOPE 01/02/2010  . SEIZURE DISORDER 04/21/2009  . FATIGUE 04/21/2009  . THYROID CANCER, HX OF 04/21/2009  . Osteoporosis 10/28/2011  . Environmental allergies   . Degenerative arthritis of right knee 09/24/2012  . Cancer     thyroid  . Colonic fistula     Past Surgical History  Procedure Laterality Date  . S/p left radius fracture surgury  2001    Dr. Rhona Raider, Ortho.  . S/p thyroid surgury for cancer  1985    biopsy  . Hx of right retenal detachment   Sept. 2005  . S/p right cataract with lens implant    . Left radus    . Laparoscopic sigmoid colectomy  01/30/2012    Procedure: LAPAROSCOPIC SIGMOID COLECTOMY;  Surgeon: Pedro Earls, MD;  Location: [facility];  Service: General;  Laterality: N/A;  Lap Assisted Sigmoid Colectomy with Takedown of Colovesicle Fistula  . Take down of intestinal fistula  01/30/2012    Procedure: TAKE DOWN OF INTESTINAL FISTULA;  Surgeon: Pedro Earls, MD;  Location: [facility];  Service: General;  Laterality: N/A;  . Vesico-vaginal fistula  repair  01/30/2012    Procedure: FISTULA REPAIR VESICO-VAGINAL;  Surgeon: Reece Packer, MD;  Location: [facility];  Service: Urology;  Laterality: N/A;  Enterovesical Fistula Repair  . Orif wrist fracture Left 2003    DR Judd Lien Ortho. [phone]  . Eye surgery    . Colon surgery    . Total knee arthroplasty Right 02/10/2013    Procedure: TOTAL KNEE ARTHROPLASTY;  Surgeon: Hessie Dibble, MD;  Location: [facility];  Service: Orthopedics;  Laterality: Right;    Current Outpatient Prescriptions  Medication Sig Dispense Refill  . acetaminophen (TYLENOL) 500 MG tablet Take 1,000 mg by mouth every 6 (six) hours as needed for pain.      Marland Kitchen aspirin EC 325 MG EC tablet Take 1 tablet (325 mg total) by mouth 2 (two) times daily after a meal.  30 tablet  0  . Calcium Carbonate-Vitamin D (CALTRATE 600+D) 600-400 MG-UNIT per tablet Take 1 tablet by mouth 2 (two) times daily.        . cyclobenzaprine (FLEXERIL) 5 MG tablet Take 1 tablet (5 mg total) by mouth 3 (three) times daily as needed for muscle spasms.  60 tablet  1  . cyclobenzaprine (FLEXERIL) 5 MG tablet TAKE 1 TABLET BY MOUTH THREE TIMES DAILY AS NEEDED FOR MUSCLE SPASMS  90 tablet  0  . EPITOL 200 MG tablet TAKE 1 1/2 TABLETS BY MOUTH TWICE DAILY AND 1 BY  MOUTH AT LUNCH AND DINNER AS DIRECTED  450 tablet  1  . levothyroxine (SYNTHROID, LEVOTHROID) 100 MCG tablet Take 1 tablet (100 mcg total) by mouth every morning.  90 tablet  3  . losartan (COZAAR) 50 MG tablet Take 50 mg by mouth daily.      . Magnesium 250 MG TABS Take by mouth daily.        . methocarbamol (ROBAXIN) 500 MG tablet       . oxyCODONE-acetaminophen (PERCOCET/ROXICET) 5-325 MG per tablet       . polyethylene glycol (MIRALAX / GLYCOLAX) packet Take 17 g by mouth daily as needed (constipation).  14 each  0  . vitamin C (ASCORBIC ACID) 500 MG tablet Take 250 mg by mouth daily.        No current facility-administered medications for this visit.   Sulfonamide derivatives;  Erythromycin; and Latex Family History  Problem Relation Age of Onset  . Heart disease Mother   . Goiter Mother     mother had i-131 rx for a benign goiter  . Heart disease Father   . Stroke Father   . Bipolar disorder Sister   . Heart attack Brother   . Bipolar disorder Brother   . Seizures Other     Strong family history of seizures, 2 brothers and 2 sisters   Social History:   reports that she has quit smoking. She has never used smokeless tobacco. She reports that she does not drink alcohol or use illicit drugs.   REVIEW OF SYSTEMS - PERTINENT POSITIVES ONLY: noncontributory  Physical Exam:   Blood pressure 122/76, pulse 75, temperature 98.4 F (36.9 C), temperature source Oral, resp. rate 14, height 5\' 11"  (1.803 m), weight 145 lb 9.6 oz (66.044 kg). Body mass index is 20.32 kg/(m^2).  Gen:  WDWN white female NAD  Neurological: Alert and oriented to person, place, and time. Motor and sensory function is grossly intact  Head: Normocephalic and atraumatic.  Eyes: Conjunctivae are normal. Pupils are equal, round, and reactive to light. No scleral icterus.  Neck: Normal range of motion. Neck supple. No tracheal deviation or thyromegaly present.  Cardiovascular:  SR without murmurs or gallops.  No carotid bruits Respiratory: Effort normal.  No respiratory distress. No chest wall tenderness. Breath sounds normal.  No wheezes, rales or rhonchi.  Abdomen:  nontender GU: Musculoskeletal: Normal range of motion. Extremities are nontender. No cyanosis, edema or clubbing noted Lymphadenopathy: No cervical, preauricular, postauricular or axillary adenopathy is present Skin: Skin is warm and dry. No rash noted. No diaphoresis. No erythema. No pallor. Pscyh: Normal mood and affect. Behavior is normal. Judgment and thought content normal.   LABORATORY RESULTS: No results found for this or any previous visit (from the past 48 hour(s)).  RADIOLOGY RESULTS: No results found.  Problem  List: Patient Active Problem List   Diagnosis Date Noted  . 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 Degenerative arthritis of right knee 09/24/2012  . Urinary tract infection, site not specified 01/21/2012  . Osteoporosis 10/28/2011  . Menopausal syndrome 09/06/2011  . Preventative health care 08/13/2010  . SYNCOPE 01/02/2010  . GOITER, MULTINODULAR 05/04/2009  . HYPOTHYROIDISM 04/21/2009  . HYPERLIPIDEMIA 04/21/2009  . HYPERTENSION 04/21/2009  . ALLERGIC RHINITIS 04/21/2009  . SEIZURE DISORDER 04/21/2009    Assessment & Plan: Laparoscopically assisted left hemicolectomy and takedown of colovesical fistula.    Matt B. Hassell Done, MD, [facility] [phone] beeper [phone]  07/01/2013  11:09 AM

## 2013-07-02 ENCOUNTER — Other Ambulatory Visit: Payer: Self-pay | Admitting: Urology

## 2013-07-06 ENCOUNTER — Other Ambulatory Visit: Payer: Self-pay | Admitting: Urology

## 2013-07-08 ENCOUNTER — Other Ambulatory Visit: Payer: Self-pay | Admitting: Urology

## 2013-07-14 ENCOUNTER — Other Ambulatory Visit: Payer: Self-pay | Admitting: Urology

## 2013-07-15 ENCOUNTER — Encounter (HOSPITAL_COMMUNITY): Payer: Self-pay | Admitting: Pharmacy Technician

## 2013-07-17 ENCOUNTER — Other Ambulatory Visit: Payer: Self-pay | Admitting: Urology

## 2013-07-21 NOTE — Patient Instructions (Addendum)
[place]  07/21/2013   Your procedure is scheduled on: 07/28/13  Report to [facility] at 6:15 AM.  Call this number if you have problems the morning of surgery 336-: [phone]   Remember: please complete bowel prep the day before surgery   Do not eat food or drink liquids After Midnight.     Take these medicines the morning of surgery with A SIP OF WATER: carbamazepine, levothyroxine, oxycodone if needed   Do not wear jewelry, make-up or nail polish.  Do not wear lotions, powders, or perfumes. You may wear deodorant.  Do not shave 48 hours prior to surgery. Men may shave face and neck.  Do not bring valuables to the hospital.  Contacts, dentures or bridgework may not be worn into surgery.  Leave suitcase in the car. After surgery it may be brought to your room.  For patients admitted to the hospital, checkout time is 11:00 AM the day of discharge.   Paulette Blanch, RN  pre op nurse call if needed [phone]    [facility] - Preparing for Surgery Before surgery, you can play an important role.  Because skin is not sterile, your skin needs to be as free of germs as possible.  You can reduce the number of germs on your skin by washing with CHG (chlorahexidine gluconate) soap before surgery.  CHG is an antiseptic cleaner which kills germs and bonds with the skin to continue killing germs even after washing. Please DO NOT use if you have an allergy to CHG or antibacterial soaps.  If your skin becomes reddened/irritated stop using the CHG and inform your nurse when you arrive at Short Stay. Do not shave (including legs and underarms) for at least 48 hours prior to the first CHG shower.  You may shave your face. Please follow these instructions carefully:  1.  Shower with CHG Soap the night before surgery and the  morning of Surgery.  2.  If you choose to wash your hair, wash your hair first as usual with your  normal  shampoo.  3.  After you shampoo, rinse your  hair and body thoroughly to remove the  shampoo.                             4.  Use CHG as you would any other liquid soap.  You can apply chg directly  to the skin and wash                       Gently with a scrungie or clean washcloth.  5.  Apply the CHG Soap to your body ONLY FROM THE NECK DOWN.   Do not use on open                           Wound or open sores. Avoid contact with eyes, ears mouth and genitals (private parts).                        Genitals (private parts) with your normal soap.             6.  Wash thoroughly, paying special attention to the area where your surgery  will be performed.  7.  Thoroughly rinse your body with warm water from the neck down.  8.  DO NOT shower/wash with your  normal soap after using and rinsing off  the CHG Soap.                9.  Pat yourself dry with a clean towel.            10.  Wear clean pajamas.            11.  Place clean sheets on your bed the night of your first shower and do not  sleep with pets. Day of Surgery : Do not apply any lotions/deodorants the morning of surgery.  Please wear clean clothes to the hospital/surgery center.  FAILURE TO FOLLOW THESE INSTRUCTIONS MAY RESULT IN THE CANCELLATION OF YOUR SURGERY PATIENT SIGNATURE_________________________________  NURSE SIGNATURE__________________________________  WHAT IS A BLOOD TRANSFUSION? Blood Transfusion Information  A transfusion is the replacement of blood or some of its parts. Blood is made up of multiple cells which provide different functions.  Red blood cells carry oxygen and are used for blood loss replacement.  White blood cells fight against infection.  Platelets control bleeding.  Plasma helps clot blood.  Other blood products are available for specialized needs, such as hemophilia or other clotting disorders. BEFORE THE TRANSFUSION  Who gives blood for transfusions?   Healthy volunteers who are fully evaluated to make sure their blood is safe. This is  blood bank blood. Transfusion therapy is the safest it has ever been in the practice of medicine. Before blood is taken from a donor, a complete history is taken to make sure that person has no history of diseases nor engages in risky social behavior (examples are intravenous drug use or sexual activity with multiple partners). The donor's travel history is screened to minimize risk of transmitting infections, such as malaria. The donated blood is tested for signs of infectious diseases, such as HIV and hepatitis. The blood is then tested to be sure it is compatible with you in order to minimize the chance of a transfusion reaction. If you or a relative donates blood, this is often done in anticipation of surgery and is not appropriate for emergency situations. It takes many days to process the donated blood. RISKS AND COMPLICATIONS Although transfusion therapy is very safe and saves many lives, the main dangers of transfusion include:   Getting an infectious disease.  Developing a transfusion reaction. This is an allergic reaction to something in the blood you were given. Every precaution is taken to prevent this. The decision to have a blood transfusion has been considered carefully by your caregiver before blood is given. Blood is not given unless the benefits outweigh the risks. AFTER THE TRANSFUSION  Right after receiving a blood transfusion, you will usually feel much better and more energetic. This is especially true if your red blood cells have gotten low (anemic). The transfusion raises the level of the red blood cells which carry oxygen, and this usually causes an energy increase.  The nurse administering the transfusion will monitor you carefully for complications. HOME CARE INSTRUCTIONS  No special instructions are needed after a transfusion. You may find your energy is better. Speak with your caregiver about any limitations on activity for underlying diseases you may have. SEEK MEDICAL  CARE IF:   Your condition is not improving after your transfusion.  You develop redness or irritation at the intravenous (IV) site. SEEK IMMEDIATE MEDICAL CARE IF:  Any of the following symptoms occur over the next 12 hours:  Shaking chills.  You have a temperature by mouth above 102  F (38.9 C), not controlled by medicine.  Chest, back, or muscle pain.  People around you feel you are not acting correctly or are confused.  Shortness of breath or difficulty breathing.  Dizziness and fainting.  You get a rash or develop hives.  You have a decrease in urine output.  Your urine turns a dark color or changes to pink, red, or brown. Any of the following symptoms occur over the next 10 days:  You have a temperature by mouth above 102 F (38.9 C), not controlled by medicine.  Shortness of breath.  Weakness after normal activity.  The white part of the eye turns yellow (jaundice).  You have a decrease in the amount of urine or are urinating less often.  Your urine turns a dark color or changes to pink, red, or brown. Document Released: 03/16/2000 Document Revised: 06/11/2011 Document Reviewed: 11/03/2007 [employer] [facility] Patient Information 2014 [place].

## 2013-07-21 NOTE — Progress Notes (Signed)
Chest x-ray 02/02/13 on EPIC, EKG 02/14/13 on EPIC

## 2013-07-22 ENCOUNTER — Encounter (HOSPITAL_COMMUNITY)
Admission: RE | Admit: 2013-07-22 | Discharge: 2013-07-22 | Disposition: A | Discharge status: Home / Self Care | Payer: Medicare Other | Source: Ambulatory Visit | Attending: Surgery | Admitting: Surgery

## 2013-07-22 ENCOUNTER — Encounter (HOSPITAL_COMMUNITY): Payer: Self-pay

## 2013-07-22 DIAGNOSIS — N321 Vesicointestinal fistula: Secondary | ICD-10-CM | POA: Insufficient documentation

## 2013-07-22 DIAGNOSIS — Z01812 Encounter for preprocedural laboratory examination: Secondary | ICD-10-CM | POA: Insufficient documentation

## 2013-07-22 HISTORY — DX: Gastro-esophageal reflux disease without esophagitis: K21.9

## 2013-07-22 HISTORY — DX: Muscle spasm of back: M62.830

## 2013-07-22 HISTORY — DX: Headache: R51

## 2013-07-22 HISTORY — DX: Personal history of other infectious and parasitic diseases: Z86.19

## 2013-07-22 LAB — CBC
HEMATOCRIT: 36.4 % (ref 36.0–46.0)
Hemoglobin: 12.8 g/dL (ref 12.0–15.0)
MCH: 32.7 pg (ref 26.0–34.0)
MCHC: 35.2 g/dL (ref 30.0–36.0)
MCV: 92.9 fL (ref 78.0–100.0)
Platelets: 293 10*3/uL (ref 150–400)
RBC: 3.92 MIL/uL (ref 3.87–5.11)
RDW: 13 % (ref 11.5–15.5)
WBC: 5.7 10*3/uL (ref 4.0–10.5)

## 2013-07-22 LAB — BASIC METABOLIC PANEL
BUN: 14 mg/dL (ref 6–23)
CHLORIDE: 90 meq/L — AB (ref 96–112)
CO2: 25 meq/L (ref 19–32)
CREATININE: 0.53 mg/dL (ref 0.50–1.10)
Calcium: 9 mg/dL (ref 8.4–10.5)
GFR calc Af Amer: >90 mL/min (ref >90)
GFR calc non Af Amer: >90 mL/min (ref >90)
Glucose, Bld: 104 mg/dL — ABNORMAL HIGH (ref 70–99)
Potassium: 4.3 mEq/L (ref 3.7–5.3)
Sodium: 126 mEq/L — ABNORMAL LOW (ref 137–147)

## 2013-07-22 LAB — ABO/RH: ABO/RH(D): O POS

## 2013-07-22 NOTE — Progress Notes (Signed)
bmet results routed to dr Johnathan Hausen epic inbasket

## 2013-07-27 NOTE — H&P (Signed)
History of Present Illness   Tammy Duke had a bladder bowel fistula repaired in 2013 by Dr. Hassell Done and myself. She has had some nonspecific symptoms in the meantime and I felt she was chronically colonized, though I did treat a few positive cultures. In May of last year, she had a thorough cystoscopy with no recurrent fistula.  In the last 2 weeks, she reported some feces in her urine. She had some constipation treated with MiraLax and Dulcolax. She had recent knee surgery. She is going a little bit more frequently small volumes. I am not convinced she is passing bubbles, but she feels a flutter. She is not really having pain.   There is no other modifying factors or associated signs or symptoms. There are no other aggravating or relieving factors. The symptoms are moderate in severity and persisting.  Urinalysis: Positive red blood cells, positive bacteria. Urine sent for culture.   Review of Systems: No other change in bowel or neurologic systems.   Tammy Duke underwent flexible cystoscopy after verbal consent. Her urine was very cloudy. I thought there were small brown material in keeping with feces. There are white flecks in her urine. I could see the air bubble at the top. Initially I thought I saw a large fistula near the dome, but the more I looked, I really did not see a fistula for certain. The lighting was a little bit dark relative to the cloudy urine. I felt she had mild cystitis near the floor of her bladder. She tolerated the procedure very well.   I did a double speculum exam and there was a little bit of discharge in the vagina. I did not see any vaginitis or fistula.   Bladder scan residual was 0.0 mL, tested for chronic cystitis.    Past Medical History Problems  1. History of Arthritis (V13.4) 2. History of Cancer (199.1) 3. History of hypertension (V12.59) 4. History of Seizure  Surgical History Problems  1. History of Arm Incision 2. History of Closure Of  Enterovesical Fistula With Bladder Resection  Current Meds 1. Acetaminophen-Codeine #3 300-30 MG Oral Tablet;  Therapy: [id number] to Recorded 2. CarBAMazepine 200 MG Oral Tablet;  Therapy: [id number] to Recorded 3. Ciprofloxacin HCl - 250 MG Oral Tablet; TAKE 2 TABLET Daily;  Therapy: 24PYK9983 to (Evaluate:16May2014)  Requested for: 38SNK5397; Last  QB:[id number] Ordered 4. Cyclobenzaprine HCl - 5 MG Oral Tablet;  Therapy: [phone] to Recorded 5. Levothyroxine Sodium 100 MCG Oral Tablet;  Therapy: [phone] to Recorded 6. Losartan Potassium 50 MG Oral Tablet;  Therapy: [phone] to Recorded 7. Meloxicam 15 MG Oral Tablet;  Therapy: [phone] to Recorded 8. Nitrofurantoin Macrocrystal 100 MG Oral Capsule;  Therapy: [id number] to Recorded 9. Oxycodone-Acetaminophen 5-325 MG Oral Tablet;  Therapy: (Recorded:29Jan2015) to Recorded 10. Pantoprazole Sodium 40 MG Oral Tablet Delayed Release;   Therapy: 18Apr2014 to Recorded 11. PEG-3350/Electrolytes 236 GM Oral Solution Reconstituted;   Therapy: 10Oct2013 to Recorded 12. Robaxin TABS;   Therapy: (Recorded:29Jan2015) to Recorded  Allergies Medication  1. Sulfa Drugs  Family History Problems  1. Family history of Cardiac Arrest : Brother 2. Family history of Cardiac Arrest : Sister 3. Family history of Death In The Family Father : Father   father passed @ age 68pancreatitis 56. Family history of Death In The Family Mother : Mother   mother passed @ age 73leukemia 15. Family history of Epilepsy And Recurrent Seizures (V17.2) : Brother 6. Family history of Epilepsy And Recurrent Seizures (V17.2) : Brother 7. Family  history of Epilepsy And Recurrent Seizures (V17.2) : Sister 8. Family history of Epilepsy And Recurrent Seizures (V17.2) : Sister 1. Family history of Leukemia (V16.6) : Mother 71. Family history of Nephrolithiasis : Brother  Social History Problems  1. Denied: History of Alcohol Use 2. Caffeine Use   1 drink  daily 3. History of tobacco use (V15.82)   smoked less than 1/2 pack dailysmoked for 15 yearsquit smoking 13 years ago 4. Marital History - Widowed 5. Occupation: Retired  Engineer, site Vital Signs [Data Includes: Last 1 Day]  Recorded: 29Jan2015 09:09AM  Height: 5 ft 11 in Weight: 162 lb  BMI Calculated: 22.59 BSA Calculated: 1.93 Blood Pressure: 127 / 84, Sitting Temperature: 98.2 F, Oral Heart Rate: 94 Respiration: 20  Results/Data Urine [Data Includes: Last 1 Day]   [id number]  COLOR YELLOW   APPEARANCE TURBID   SPECIFIC GRAVITY 1.010   pH 7.0   GLUCOSE NEG mg/dL  BILIRUBIN NEG   KETONE NEG mg/dL  BLOOD LARGE   PROTEIN 100 mg/dL  UROBILINOGEN 0.2 mg/dL  NITRITE POS   LEUKOCYTE ESTERASE LARGE   SQUAMOUS EPITHELIAL/HPF FEW   WBC TNTC WBC/hpf  RBC 3-6 RBC/hpf  BACTERIA MANY   CRYSTALS NONE SEEN   CASTS NONE SEEN    Assessment Assessed  1. 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 Chronic cystitis (595.2)  Plan Chronic cystitis  1. Start: Ciprofloxacin HCl - 250 MG Oral Tablet (Cipro); TAKE 1 TABLET BID 2. URINE CULTURE; Status:In Progress - Specimen/Data Collected;   Done: [id number] Enterovesical fistula  3. General Surgery Referral Referral  Referral to Dr Hassell Done  please see soon  recurrent bowel bladder fistula - send today notes  Status: Hold For -  Appointment,Records  Requested for: 29Jan2015 4. Cysto; Status:Complete;   Done: [id number] Health Maintenance  5. UA With REFLEX; [Do Not Release]; Status:Resulted - Requires Verification;   Done:  [id number] 08:41AM Unlinked  6. Stop: Meloxicam 15 MG Oral Tablet  Discussion/Summary   Tammy Duke may have a recurrent enterovesical fistula. I recommend cystoscopy.   Tammy Duke had a positive culture treated before her knee surgery. I am going to refer her to Dr. Hassell Done again, and I will leave it to Dr. Hassell Done what x-ray he thinks we should order to find a highly suggestive of a recurrent enterovaginal fistula. Ms.  Duke has not had any episodes of diverticulitis clinically.   Because of Tammy Duke' cystoscopic findings and cystoscopy, I did empirically give her ciprofloxacin. I will call if the culture differs.  After a thorough review of the management options for the patient's condition the patient  elected to proceed with surgical therapy as noted above. We have discussed the potential benefits and risks of the procedure, side effects of the proposed treatment, the likelihood of the patient achieving the goals of the procedure, and any potential problems that might occur during the procedure or recuperation. Informed consent has been obtained.

## 2013-07-28 ENCOUNTER — Inpatient Hospital Stay (HOSPITAL_COMMUNITY): Payer: Medicare Other | Admitting: Anesthesiology

## 2013-07-28 ENCOUNTER — Inpatient Hospital Stay (HOSPITAL_COMMUNITY)
Admission: RE | Admit: 2013-07-28 | Discharge: 2013-08-05 | DRG: 674 | Disposition: A | Discharge status: Skilled Nursing Facility | Payer: Medicare Other | Source: Ambulatory Visit | Attending: Surgery | Admitting: Surgery

## 2013-07-28 ENCOUNTER — Encounter (HOSPITAL_COMMUNITY): Payer: Self-pay | Admitting: *Deleted

## 2013-07-28 ENCOUNTER — Encounter (HOSPITAL_COMMUNITY): Payer: Medicare Other | Admitting: Anesthesiology

## 2013-07-28 ENCOUNTER — Encounter (HOSPITAL_COMMUNITY)
Admission: RE | Disposition: A | Discharge status: Skilled Nursing Facility | Payer: Self-pay | Source: Ambulatory Visit | Attending: Surgery

## 2013-07-28 DIAGNOSIS — Z806 Family history of leukemia: Secondary | ICD-10-CM

## 2013-07-28 DIAGNOSIS — Z8249 Family history of ischemic heart disease and other diseases of the circulatory system: Secondary | ICD-10-CM

## 2013-07-28 DIAGNOSIS — Z882 Allergy status to sulfonamides status: Secondary | ICD-10-CM

## 2013-07-28 DIAGNOSIS — N322 Vesical fistula, not elsewhere classified: Secondary | ICD-10-CM | POA: Diagnosis present

## 2013-07-28 DIAGNOSIS — Z820 Family history of epilepsy and other diseases of the nervous system: Secondary | ICD-10-CM

## 2013-07-28 DIAGNOSIS — N3020 Other chronic cystitis without hematuria: Secondary | ICD-10-CM | POA: Diagnosis present

## 2013-07-28 DIAGNOSIS — I10 Essential (primary) hypertension: Secondary | ICD-10-CM | POA: Diagnosis present

## 2013-07-28 DIAGNOSIS — E785 Hyperlipidemia, unspecified: Secondary | ICD-10-CM | POA: Diagnosis present

## 2013-07-28 DIAGNOSIS — K5900 Constipation, unspecified: Secondary | ICD-10-CM | POA: Diagnosis present

## 2013-07-28 DIAGNOSIS — N321 Vesicointestinal fistula: Principal | ICD-10-CM | POA: Diagnosis present

## 2013-07-28 DIAGNOSIS — K660 Peritoneal adhesions (postprocedural) (postinfection): Secondary | ICD-10-CM | POA: Diagnosis present

## 2013-07-28 DIAGNOSIS — E039 Hypothyroidism, unspecified: Secondary | ICD-10-CM | POA: Diagnosis present

## 2013-07-28 DIAGNOSIS — M810 Age-related osteoporosis without current pathological fracture: Secondary | ICD-10-CM | POA: Diagnosis present

## 2013-07-28 DIAGNOSIS — Z79899 Other long term (current) drug therapy: Secondary | ICD-10-CM

## 2013-07-28 DIAGNOSIS — K630 Abscess of intestine: Secondary | ICD-10-CM

## 2013-07-28 DIAGNOSIS — D62 Acute posthemorrhagic anemia: Secondary | ICD-10-CM | POA: Diagnosis not present

## 2013-07-28 DIAGNOSIS — Z96659 Presence of unspecified artificial knee joint: Secondary | ICD-10-CM

## 2013-07-28 DIAGNOSIS — G40909 Epilepsy, unspecified, not intractable, without status epilepticus: Secondary | ICD-10-CM | POA: Diagnosis present

## 2013-07-28 DIAGNOSIS — Z87891 Personal history of nicotine dependence: Secondary | ICD-10-CM

## 2013-07-28 DIAGNOSIS — M1710 Unilateral primary osteoarthritis, unspecified knee: Secondary | ICD-10-CM | POA: Diagnosis present

## 2013-07-28 DIAGNOSIS — E871 Hypo-osmolality and hyponatremia: Secondary | ICD-10-CM | POA: Diagnosis not present

## 2013-07-28 DIAGNOSIS — Z823 Family history of stroke: Secondary | ICD-10-CM

## 2013-07-28 DIAGNOSIS — Z85850 Personal history of malignant neoplasm of thyroid: Secondary | ICD-10-CM

## 2013-07-28 HISTORY — PX: VESICO-VAGINAL FISTULA REPAIR: SHX5129

## 2013-07-28 HISTORY — PX: COLON RESECTION: SHX5231

## 2013-07-28 LAB — TYPE AND SCREEN
ABO/RH(D): O POS
Antibody Screen: NEGATIVE

## 2013-07-28 LAB — CBC
HEMATOCRIT: 31.4 % — AB (ref 36.0–46.0)
HEMOGLOBIN: 11.2 g/dL — AB (ref 12.0–15.0)
MCH: 33.2 pg (ref 26.0–34.0)
MCHC: 35.7 g/dL (ref 30.0–36.0)
MCV: 93.2 fL (ref 78.0–100.0)
Platelets: 236 10*3/uL (ref 150–400)
RBC: 3.37 MIL/uL — ABNORMAL LOW (ref 3.87–5.11)
RDW: 13.2 % (ref 11.5–15.5)
WBC: 16.6 10*3/uL — ABNORMAL HIGH (ref 4.0–10.5)

## 2013-07-28 LAB — CREATININE, SERUM
CREATININE: 0.63 mg/dL (ref 0.50–1.10)
GFR calc Af Amer: >90 mL/min (ref >90)
GFR calc non Af Amer: 88 mL/min — ABNORMAL LOW (ref >90)

## 2013-07-28 SURGERY — LAPAROSCOPIC SIGMOID COLON RESECTION
Anesthesia: General | Site: Ureter

## 2013-07-28 MED ORDER — METOCLOPRAMIDE HCL 5 MG/ML IJ SOLN
INTRAMUSCULAR | Status: DC | PRN
  Administered 2013-07-28: 10 mg via INTRAVENOUS

## 2013-07-28 MED ORDER — HEPARIN SODIUM (PORCINE) 5000 UNIT/ML IJ SOLN
5000.0000 [IU] | Freq: Three times a day (TID) | INTRAMUSCULAR | Status: DC
  Administered 2013-07-29 – 2013-08-05 (×22): 5000 [IU] via SUBCUTANEOUS
  Filled 2013-07-28 (×26): qty 1

## 2013-07-28 MED ORDER — KCL IN DEXTROSE-NACL 20-5-0.45 MEQ/L-%-% IV SOLN
INTRAVENOUS | Status: AC
  Filled 2013-07-28: qty 1000

## 2013-07-28 MED ORDER — KETOROLAC TROMETHAMINE 30 MG/ML IJ SOLN
15.0000 mg | Freq: Once | INTRAMUSCULAR | Status: AC | PRN
  Administered 2013-07-28: 30 mg via INTRAVENOUS

## 2013-07-28 MED ORDER — METOCLOPRAMIDE HCL 5 MG/ML IJ SOLN
INTRAMUSCULAR | Status: AC
Start: 2013-07-28 — End: 2013-07-28
  Filled 2013-07-28: qty 2

## 2013-07-28 MED ORDER — ONDANSETRON HCL 4 MG/2ML IJ SOLN
4.0000 mg | Freq: Four times a day (QID) | INTRAMUSCULAR | Status: DC | PRN
  Administered 2013-07-28 – 2013-08-01 (×3): 4 mg via INTRAVENOUS
  Filled 2013-07-28 (×3): qty 2

## 2013-07-28 MED ORDER — ONDANSETRON HCL 4 MG PO TABS: 4.0000 mg | ORAL_TABLET | Freq: Four times a day (QID) | ORAL | Status: DC | PRN

## 2013-07-28 MED ORDER — FENTANYL CITRATE 0.05 MG/ML IJ SOLN
INTRAMUSCULAR | Status: DC | PRN
  Administered 2013-07-28 (×14): 50 ug via INTRAVENOUS

## 2013-07-28 MED ORDER — FENTANYL CITRATE 0.05 MG/ML IJ SOLN
INTRAMUSCULAR | Status: AC
  Filled 2013-07-28: qty 2

## 2013-07-28 MED ORDER — GLYCOPYRROLATE 0.2 MG/ML IJ SOLN
INTRAMUSCULAR | Status: DC | PRN
  Administered 2013-07-28: 0.6 mg via INTRAVENOUS

## 2013-07-28 MED ORDER — CEFOTETAN DISODIUM 2 G IJ SOLR
2.0000 g | Freq: Two times a day (BID) | INTRAMUSCULAR | Status: AC
  Administered 2013-07-28: 2 g via INTRAVENOUS
  Filled 2013-07-28: qty 2

## 2013-07-28 MED ORDER — NEOSTIGMINE METHYLSULFATE 1 MG/ML IJ SOLN
INTRAMUSCULAR | Status: AC
  Filled 2013-07-28: qty 10

## 2013-07-28 MED ORDER — FENTANYL CITRATE 0.05 MG/ML IJ SOLN
INTRAMUSCULAR | Status: AC
  Filled 2013-07-28: qty 5

## 2013-07-28 MED ORDER — DEXAMETHASONE SODIUM PHOSPHATE 10 MG/ML IJ SOLN
INTRAMUSCULAR | Status: AC
  Filled 2013-07-28: qty 1

## 2013-07-28 MED ORDER — PROPOFOL 10 MG/ML IV BOLUS
INTRAVENOUS | Status: DC | PRN
  Administered 2013-07-28: 150 mg via INTRAVENOUS

## 2013-07-28 MED ORDER — METHYLENE BLUE 1 % INJ SOLN
INTRAMUSCULAR | Status: AC
  Filled 2013-07-28: qty 10

## 2013-07-28 MED ORDER — BUPIVACAINE-EPINEPHRINE 0.25% -1:200000 IJ SOLN
INTRAMUSCULAR | Status: DC | PRN
  Administered 2013-07-28: 10 mL

## 2013-07-28 MED ORDER — DIPHENHYDRAMINE HCL 12.5 MG/5ML PO ELIX: 12.5000 mg | ORAL_SOLUTION | Freq: Four times a day (QID) | ORAL | Status: DC | PRN

## 2013-07-28 MED ORDER — SUCCINYLCHOLINE CHLORIDE 20 MG/ML IJ SOLN: INTRAMUSCULAR | Status: DC | PRN

## 2013-07-28 MED ORDER — LACTATED RINGERS IV SOLN: INTRAVENOUS | Status: DC

## 2013-07-28 MED ORDER — NEOSTIGMINE METHYLSULFATE 1 MG/ML IJ SOLN
INTRAMUSCULAR | Status: DC | PRN
  Administered 2013-07-28: 4 mg via INTRAVENOUS

## 2013-07-28 MED ORDER — ALUM & MAG HYDROXIDE-SIMETH 200-200-20 MG/5ML PO SUSP
30.0000 mL | Freq: Four times a day (QID) | ORAL | Status: DC | PRN
  Administered 2013-07-30 – 2013-08-04 (×12): 30 mL via ORAL
  Filled 2013-07-28 (×12): qty 30

## 2013-07-28 MED ORDER — DIPHENHYDRAMINE HCL 50 MG/ML IJ SOLN: 12.5000 mg | Freq: Four times a day (QID) | INTRAMUSCULAR | Status: DC | PRN

## 2013-07-28 MED ORDER — GLYCOPYRROLATE 0.2 MG/ML IJ SOLN
INTRAMUSCULAR | Status: AC
  Filled 2013-07-28: qty 3

## 2013-07-28 MED ORDER — ROCURONIUM BROMIDE 100 MG/10ML IV SOLN
INTRAVENOUS | Status: AC
  Filled 2013-07-28: qty 1

## 2013-07-28 MED ORDER — MIDAZOLAM HCL 2 MG/2ML IJ SOLN
INTRAMUSCULAR | Status: AC
  Filled 2013-07-28: qty 2

## 2013-07-28 MED ORDER — DEXAMETHASONE SODIUM PHOSPHATE 10 MG/ML IJ SOLN
INTRAMUSCULAR | Status: DC | PRN
  Administered 2013-07-28: 10 mg via INTRAVENOUS

## 2013-07-28 MED ORDER — ROCURONIUM BROMIDE 100 MG/10ML IV SOLN
INTRAVENOUS | Status: AC
Start: 2013-07-28 — End: 2013-07-28
  Filled 2013-07-28: qty 1

## 2013-07-28 MED ORDER — HYDROMORPHONE HCL PF 1 MG/ML IJ SOLN
INTRAMUSCULAR | Status: AC
  Filled 2013-07-28: qty 1

## 2013-07-28 MED ORDER — LACTATED RINGERS IV SOLN
INTRAVENOUS | Status: DC | PRN
  Administered 2013-07-28 (×4): via INTRAVENOUS

## 2013-07-28 MED ORDER — SODIUM CHLORIDE 0.9 % IR SOLN
Status: DC | PRN
  Administered 2013-07-28: 3000 mL

## 2013-07-28 MED ORDER — BUPIVACAINE-EPINEPHRINE 0.25% -1:200000 IJ SOLN
INTRAMUSCULAR | Status: AC
  Filled 2013-07-28: qty 1

## 2013-07-28 MED ORDER — ROCURONIUM BROMIDE 100 MG/10ML IV SOLN
INTRAVENOUS | Status: DC | PRN
  Administered 2013-07-28: 10 mg via INTRAVENOUS
  Administered 2013-07-28 (×2): 20 mg via INTRAVENOUS
  Administered 2013-07-28 (×5): 10 mg via INTRAVENOUS
  Administered 2013-07-28: 40 mg via INTRAVENOUS
  Administered 2013-07-28: 10 mg via INTRAVENOUS
  Administered 2013-07-28: 20 mg via INTRAVENOUS

## 2013-07-28 MED ORDER — MORPHINE SULFATE 2 MG/ML IJ SOLN
1.0000 mg | INTRAMUSCULAR | Status: DC | PRN
  Administered 2013-07-28 – 2013-08-02 (×21): 1 mg via INTRAVENOUS
  Filled 2013-07-28 (×21): qty 1

## 2013-07-28 MED ORDER — KCL IN DEXTROSE-NACL 20-5-0.45 MEQ/L-%-% IV SOLN
INTRAVENOUS | Status: DC
  Administered 2013-07-28 – 2013-07-31 (×8): via INTRAVENOUS
  Administered 2013-07-31: 100 mL/h via INTRAVENOUS
  Administered 2013-08-01: 03:00:00 via INTRAVENOUS
  Filled 2013-07-28 (×10): qty 1000

## 2013-07-28 MED ORDER — ONDANSETRON HCL 4 MG/2ML IJ SOLN
INTRAMUSCULAR | Status: DC | PRN
  Administered 2013-07-28: 4 mg via INTRAVENOUS

## 2013-07-28 MED ORDER — STERILE WATER FOR IRRIGATION IR SOLN
Status: DC | PRN
  Administered 2013-07-28: 3000 mL via INTRAVESICAL

## 2013-07-28 MED ORDER — KETOROLAC TROMETHAMINE 30 MG/ML IJ SOLN
INTRAMUSCULAR | Status: AC
  Filled 2013-07-28: qty 1

## 2013-07-28 MED ORDER — MIDAZOLAM HCL 5 MG/5ML IJ SOLN
INTRAMUSCULAR | Status: DC | PRN
  Administered 2013-07-28 (×2): 1 mg via INTRAVENOUS

## 2013-07-28 MED ORDER — CEFOTETAN DISODIUM 2 G IJ SOLR
2.0000 g | INTRAMUSCULAR | Status: AC
  Administered 2013-07-28: 2 g via INTRAVENOUS

## 2013-07-28 MED ORDER — PROPOFOL 10 MG/ML IV BOLUS
INTRAVENOUS | Status: AC
  Filled 2013-07-28: qty 20

## 2013-07-28 MED ORDER — PROMETHAZINE HCL 25 MG/ML IJ SOLN
INTRAMUSCULAR | Status: AC
  Filled 2013-07-28: qty 1

## 2013-07-28 MED ORDER — CEFOTETAN DISODIUM-DEXTROSE 2-2.08 GM-% IV SOLR
INTRAVENOUS | Status: AC
Start: 2013-07-28 — End: 2013-07-28
  Filled 2013-07-28: qty 50

## 2013-07-28 MED ORDER — ESTRADIOL 0.1 MG/GM VA CREA
TOPICAL_CREAM | VAGINAL | Status: AC
  Filled 2013-07-28: qty 42.5

## 2013-07-28 MED ORDER — HYDROMORPHONE HCL PF 1 MG/ML IJ SOLN
0.2500 mg | INTRAMUSCULAR | Status: DC | PRN
  Administered 2013-07-28: 0.25 mg via INTRAVENOUS
  Administered 2013-07-28: 0.5 mg via INTRAVENOUS

## 2013-07-28 MED ORDER — PROMETHAZINE HCL 25 MG/ML IJ SOLN
6.2500 mg | INTRAMUSCULAR | Status: DC | PRN
  Administered 2013-07-28: 6.25 mg via INTRAVENOUS

## 2013-07-28 MED ORDER — ONDANSETRON HCL 4 MG/2ML IJ SOLN
INTRAMUSCULAR | Status: AC
  Filled 2013-07-28: qty 2

## 2013-07-28 MED ORDER — HEPARIN SODIUM (PORCINE) 5000 UNIT/ML IJ SOLN
5000.0000 [IU] | Freq: Once | INTRAMUSCULAR | Status: AC
  Administered 2013-07-28: 5000 [IU] via SUBCUTANEOUS
  Filled 2013-07-28: qty 1

## 2013-07-28 SURGICAL SUPPLY — 147 items
ADH SKN CLS APL DERMABOND .7 (GAUZE/BANDAGES/DRESSINGS)
APPLIER CLIP 5 13 M/L LIGAMAX5 (MISCELLANEOUS)
APPLIER CLIP ROT 10 11.4 M/L (STAPLE)
APR CLP MED LRG 11.4X10 (STAPLE)
APR CLP MED LRG 5 ANG JAW (MISCELLANEOUS)
BAG URINE DRAINAGE (UROLOGICAL SUPPLIES) ×2 IMPLANT
BLADE EXTENDED COATED 6.5IN (ELECTRODE) ×2 IMPLANT
BLADE HEX COATED 2.75 (ELECTRODE) ×8 IMPLANT
BLADE SURG 15 STRL LF DISP TIS (BLADE) ×2 IMPLANT
BLADE SURG 15 STRL SS (BLADE) ×4
BLADE SURG SZ10 CARB STEEL (BLADE) ×2 IMPLANT
CANISTER SUCTION 2500CC (MISCELLANEOUS) ×2 IMPLANT
CATH BIL BAL PROB 5FR (CATHETERS) IMPLANT
CATH BIL BAL PROB 6FR (CATHETERS) IMPLANT
CATH EMB 4FR 80CM (CATHETERS) IMPLANT
CATH EMB 5FR 80CM (CATHETERS) IMPLANT
CATH FOLEY 2WAY  3CC  8FR (CATHETERS)
CATH FOLEY 2WAY  3CC 10FR (CATHETERS)
CATH FOLEY 2WAY 3CC 10FR (CATHETERS) IMPLANT
CATH FOLEY 2WAY 3CC 8FR (CATHETERS) IMPLANT
CATH FOLEY 2WAY SLVR  5CC 14FR (CATHETERS)
CATH FOLEY 2WAY SLVR  5CC 16FR (CATHETERS) ×2
CATH FOLEY 2WAY SLVR 5CC 14FR (CATHETERS) ×2 IMPLANT
CATH FOLEY 2WAY SLVR 5CC 16FR (CATHETERS) ×2 IMPLANT
CATH FOLEY LATEX FREE 22FR (CATHETERS) ×4
CATH FOLEY LF 22FR (CATHETERS) IMPLANT
CATH URET 5FR 28IN OPEN ENDED (CATHETERS) ×2 IMPLANT
CELLS DAT CNTRL 66122 CELL SVR (MISCELLANEOUS) IMPLANT
CLAMP ENDO BABCK 10MM (STAPLE) IMPLANT
CLIP APPLIE 5 13 M/L LIGAMAX5 (MISCELLANEOUS) IMPLANT
CLIP APPLIE ROT 10 11.4 M/L (STAPLE) IMPLANT
CLOSURE WOUND 1/2 X4 (GAUZE/BANDAGES/DRESSINGS)
CONNECTOR 5 IN 1 STRAIGHT STRL (MISCELLANEOUS) IMPLANT
COVER MAYO STAND STRL (DRAPES) ×6 IMPLANT
COVER SURGICAL LIGHT HANDLE (MISCELLANEOUS) ×2 IMPLANT
DECANTER SPIKE VIAL GLASS SM (MISCELLANEOUS) ×8 IMPLANT
DERMABOND ADVANCED (GAUZE/BANDAGES/DRESSINGS)
DERMABOND ADVANCED .7 DNX12 (GAUZE/BANDAGES/DRESSINGS) IMPLANT
DEVICE CAPIO SLIM SINGLE (INSTRUMENTS) IMPLANT
DEVICE TROCAR PUNCTURE CLOSURE (ENDOMECHANICALS) IMPLANT
DRAIN CHANNEL 19F RND (DRAIN) ×2 IMPLANT
DRAIN PENROSE 18X1/4 LTX STRL (WOUND CARE) IMPLANT
DRAPE CAMERA CLOSED 9X96 (DRAPES) ×2 IMPLANT
DRAPE LAPAROSCOPIC ABDOMINAL (DRAPES) ×4 IMPLANT
DRAPE LG THREE QUARTER DISP (DRAPES) ×6 IMPLANT
DRAPE WARM FLUID 44X44 (DRAPE) ×6 IMPLANT
ELECT REM PT RETURN 9FT ADLT (ELECTROSURGICAL) ×4
ELECTRODE REM PT RTRN 9FT ADLT (ELECTROSURGICAL) ×2 IMPLANT
EVACUATOR 1/8 PVC DRAIN (DRAIN) ×2 IMPLANT
GAUZE PACKING 2X5 YD STRL (GAUZE/BANDAGES/DRESSINGS) ×6 IMPLANT
GAUZE SPONGE 4X4 16PLY XRAY LF (GAUZE/BANDAGES/DRESSINGS) ×4 IMPLANT
GLOVE BIOGEL M 8.0 STRL (GLOVE) ×4 IMPLANT
GLOVE BIOGEL M STRL SZ7.5 (GLOVE) ×2 IMPLANT
GLOVE BIOGEL PI IND STRL 6.5 (GLOVE) IMPLANT
GLOVE BIOGEL PI IND STRL 7.0 (GLOVE) ×2 IMPLANT
GLOVE BIOGEL PI IND STRL 7.5 (GLOVE) IMPLANT
GLOVE BIOGEL PI IND STRL 8 (GLOVE) IMPLANT
GLOVE BIOGEL PI INDICATOR 6.5 (GLOVE) ×10
GLOVE BIOGEL PI INDICATOR 7.0 (GLOVE) ×8
GLOVE BIOGEL PI INDICATOR 7.5 (GLOVE) ×10
GLOVE BIOGEL PI INDICATOR 8 (GLOVE) ×6
GOWN STRL REUS W/TWL LRG LVL3 (GOWN DISPOSABLE) ×8 IMPLANT
GOWN STRL REUS W/TWL XL LVL3 (GOWN DISPOSABLE) ×24 IMPLANT
HOLDER FOLEY CATH W/STRAP (MISCELLANEOUS) ×4 IMPLANT
KIT BASIN OR (CUSTOM PROCEDURE TRAY) ×8 IMPLANT
LEGGING LITHOTOMY PAIR STRL (DRAPES) ×2 IMPLANT
LIGASURE IMPACT 36 18CM CVD LR (INSTRUMENTS) ×4 IMPLANT
MANIFOLD NEPTUNE II (INSTRUMENTS) ×2 IMPLANT
NDL MAYO 6 CRC TAPER PT (NEEDLE) ×2 IMPLANT
NEEDLE HYPO 22GX1.5 SAFETY (NEEDLE) ×2 IMPLANT
NEEDLE MAYO .5 CIRCLE (NEEDLE) IMPLANT
NEEDLE MAYO 6 CRC TAPER PT (NEEDLE) IMPLANT
NS IRRIG 1000ML POUR BTL (IV SOLUTION) ×8 IMPLANT
PACK CYSTO (CUSTOM PROCEDURE TRAY) ×4 IMPLANT
PENCIL BUTTON HOLSTER BLD 10FT (ELECTRODE) ×8 IMPLANT
PLUG CATH AND CAP STER (CATHETERS) ×4 IMPLANT
POSITIONER SURGICAL ARM (MISCELLANEOUS) ×4 IMPLANT
RELOAD PROXIMATE 75MM BLUE (ENDOMECHANICALS) ×8 IMPLANT
RELOAD STAPLE 75 3.8 BLU REG (ENDOMECHANICALS) IMPLANT
RETRACTOR STAY HOOK 5MM (MISCELLANEOUS) IMPLANT
RETRACTOR WND ALEXIS 18 MED (MISCELLANEOUS) IMPLANT
RTRCTR WOUND ALEXIS 18CM MED (MISCELLANEOUS)
SCALPEL HARMONIC ACE (MISCELLANEOUS) ×2 IMPLANT
SCISSORS LAP 5X35 DISP (ENDOMECHANICALS) ×2 IMPLANT
SEALER TISSUE G2 CVD JAW 35 (ENDOMECHANICALS) IMPLANT
SEALER TISSUE G2 CVD JAW 45CM (ENDOMECHANICALS)
SET IRRIG TUBING LAPAROSCOPIC (IRRIGATION / IRRIGATOR) ×2 IMPLANT
SHEARS CURVED HARMONIC AC 45CM (MISCELLANEOUS) ×2 IMPLANT
SHEET LAVH (DRAPES) ×2 IMPLANT
SLEEVE XCEL OPT CAN 5 100 (ENDOMECHANICALS) ×6 IMPLANT
SLEEVE Z-THREAD 5X100MM (TROCAR) IMPLANT
SOLUTION ANTI FOG 6CC (MISCELLANEOUS) ×4 IMPLANT
SPONGE GAUZE 4X4 12PLY (GAUZE/BANDAGES/DRESSINGS) ×4 IMPLANT
SPONGE LAP 18X18 X RAY DECT (DISPOSABLE) ×10 IMPLANT
STAPLER GUN LINEAR PROX 60 (STAPLE) ×6 IMPLANT
STAPLER PROXIMATE 75MM BLUE (STAPLE) ×2 IMPLANT
STAPLER VISISTAT 35W (STAPLE) ×4 IMPLANT
STRIP CLOSURE SKIN 1/2X4 (GAUZE/BANDAGES/DRESSINGS) IMPLANT
SUCTION POOLE TIP (SUCTIONS) ×4 IMPLANT
SUT CAPIO ETHIBPND (SUTURE) IMPLANT
SUT CAPIO POLYGLYCOLIC (SUTURE) IMPLANT
SUT ETHIBOND 0 (SUTURE) ×4 IMPLANT
SUT ETHILON 2 0 PS N (SUTURE) ×2 IMPLANT
SUT PDS AB 1 CT1 27 (SUTURE) IMPLANT
SUT PDS AB 1 CTX 36 (SUTURE) IMPLANT
SUT PDS AB 4-0 SH 27 (SUTURE) IMPLANT
SUT PROLENE 2 0 BLUE (SUTURE) ×2 IMPLANT
SUT PROLENE 2 0 KS (SUTURE) IMPLANT
SUT SILK 2 0 (SUTURE) ×4
SUT SILK 2 0 SH (SUTURE) ×2 IMPLANT
SUT SILK 2 0 SH CR/8 (SUTURE) ×4 IMPLANT
SUT SILK 2-0 18XBRD TIE 12 (SUTURE) ×2 IMPLANT
SUT SILK 3 0 (SUTURE) ×4
SUT SILK 3 0 SH CR/8 (SUTURE) ×6 IMPLANT
SUT SILK 3-0 18XBRD TIE 12 (SUTURE) ×2 IMPLANT
SUT VIC AB 0 CT1 27 (SUTURE)
SUT VIC AB 0 CT1 27XBRD ANTBC (SUTURE) ×4 IMPLANT
SUT VIC AB 2-0 CT1 27 (SUTURE)
SUT VIC AB 2-0 CT1 27XBRD (SUTURE) ×4 IMPLANT
SUT VIC AB 2-0 SH 27 (SUTURE) ×4
SUT VIC AB 2-0 SH 27X BRD (SUTURE) ×16 IMPLANT
SUT VIC AB 2-0 UR6 27 (SUTURE) ×4 IMPLANT
SUT VIC AB 3-0 PS2 18 (SUTURE)
SUT VIC AB 3-0 PS2 18XBRD (SUTURE) IMPLANT
SUT VIC AB 3-0 SH 27 (SUTURE)
SUT VIC AB 3-0 SH 27XBRD (SUTURE) ×6 IMPLANT
SUT VIC AB 4-0 RB1 27 (SUTURE)
SUT VIC AB 4-0 RB1 27XBRD (SUTURE) ×8 IMPLANT
SUT VIC AB 4-0 SH 18 (SUTURE) ×2 IMPLANT
SUT VICRYL 0 UR6 27IN ABS (SUTURE) IMPLANT
SYR 30ML LL (SYRINGE) ×4 IMPLANT
SYR BULB IRRIGATION 50ML (SYRINGE) ×4 IMPLANT
SYRINGE 10CC LL (SYRINGE) ×8 IMPLANT
SYS LAPSCP GELPORT 120MM (MISCELLANEOUS) ×4
SYSTEM LAPSCP GELPORT 120MM (MISCELLANEOUS) IMPLANT
TOWEL OR 17X26 10 PK STRL BLUE (TOWEL DISPOSABLE) ×6 IMPLANT
TRAY FOLEY CATH 14FRSI W/METER (CATHETERS) ×4 IMPLANT
TRAY LAP CHOLE (CUSTOM PROCEDURE TRAY) ×4 IMPLANT
TROCAR BLADELESS OPT 5 100 (ENDOMECHANICALS) ×2 IMPLANT
TROCAR XCEL NON-BLD 11X100MML (ENDOMECHANICALS) IMPLANT
TROCAR XCEL UNIV SLVE 11M 100M (ENDOMECHANICALS) IMPLANT
TUBING CONNECTING 10 (TUBING) ×1 IMPLANT
TUBING CONNECTING 10' (TUBING) ×1
TUBING FILTER THERMOFLATOR (ELECTROSURGICAL) ×4 IMPLANT
WATER STERILE IRR 1500ML POUR (IV SOLUTION) ×4 IMPLANT
YANKAUER SUCT BULB TIP 10FT TU (MISCELLANEOUS) ×8 IMPLANT
YANKAUER SUCT BULB TIP NO VENT (SUCTIONS) ×4 IMPLANT

## 2013-07-28 NOTE — Anesthesia Preprocedure Evaluation (Signed)
Anesthesia Evaluation  Patient identified by MRN, date of birth, ID band Patient awake    Reviewed: Allergy & Precautions, H&P , NPO status , Patient's Chart, lab work & pertinent test results  Airway Mallampati: II TM Distance: >3 FB Neck ROM: Full    Dental no notable dental hx.    Pulmonary neg pulmonary ROS, former smoker,  breath sounds clear to auscultation  Pulmonary exam normal       Cardiovascular hypertension, Pt. on medications Rhythm:Regular Rate:Normal     Neuro/Psych Seizures -, Well Controlled,  negative psych ROS   GI/Hepatic negative GI ROS, Neg liver ROS,   Endo/Other  Hypothyroidism   Renal/GU negative Renal ROS  negative genitourinary   Musculoskeletal negative musculoskeletal ROS (+)   Abdominal   Peds negative pediatric ROS (+)  Hematology negative hematology ROS (+)   Anesthesia Other Findings   Reproductive/Obstetrics negative OB ROS                          Anesthesia Physical  Anesthesia Plan  ASA: III  Anesthesia Plan: General   Post-op Pain Management:    Induction: Intravenous  Airway Management Planned: Oral ETT  Additional Equipment:   Intra-op Plan:   Post-operative Plan: Extubation in OR  Informed Consent: I have reviewed the patients History and Physical, chart, labs and discussed the procedure including the risks, benefits and alternatives for the proposed anesthesia with the patient or authorized representative who has indicated his/her understanding and acceptance.   Dental advisory given  Plan Discussed with: CRNA and Surgeon  Anesthesia Plan Comments:         Anesthesia Quick Evaluation  

## 2013-07-28 NOTE — Brief Op Note (Signed)
07/28/2013  2:21 PM  PATIENT:  Tammy Duke  73 y.o. female  PRE-OPERATIVE DIAGNOSIS:  recurrent colovesicle fistula/BOWEL FLASH BLADDER FISTULAR  POST-OPERATIVE DIAGNOSIS:  recurrent colovesicle fistula/BOWEL FLASH BLADDER FISTULAR  PROCEDURE:  Procedure(s): LAPAROSCOPIC  SPLENIC FLEXURE mobilizationTAKEDOWN inner enterCOLOVESICLE FISTULAr  , lysis of adhesions.hartman procedure, partial salpingoectomey,   cystoscopy  (N/A) cystoscopy (Left)  SURGEON:  Surgeon(s) and Role: Panel 1:    * Odis Hollingshead, MD - Assisting    * Pedro Earls, MD - Primary  Panel 2:    * Reece Packer, MD - Primary  PHYSICIAN ASSISTANT:   ASSISTANTS: Jackolyn Confer, MD, FACS   ANESTHESIA:   general  EBL:  Total I/O In: 3600 [I.V.:3600] Out: 1000 [Urine:400; Drains:300; Blood:300]  BLOOD ADMINISTERED:none  DRAINS: (19) Jackson-Pratt drain(s) with closed bulb suction in the pelvis   LOCAL MEDICATIONS USED:  MARCAINE     SPECIMEN:  Source of Specimen:  small bowel, left fallopian tube  DISPOSITION OF SPECIMEN:  PATHOLOGY  COUNTS:  YES  TOURNIQUET:  * No tourniquets in log *  DICTATION: .Other Dictation: Dictation Number [id number]  PLAN OF CARE: Admit to inpatient   PATIENT DISPOSITION:  PACU - hemodynamically stable.   Delay start of Pharmacological VTE agent (>24hrs) due to surgical blood loss or risk of bleeding: yes

## 2013-07-28 NOTE — H&P (View-Only) (Signed)
Chief Complaint:  Recurrent colovesical fistula  History of Present Illness:  Tammy Duke is an 73 y.o. female who underwent takedown of a colovesical fistula by me back in 2013. Scott McDermott closed her bladder flap and Greer Pickerel help me to the procedure which entailed a sigmoid resection and a shoulder take her type handsewn anastomosis. I did put some Seprafilm into the wound postop.  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 It appears to be above the anastomosis area this will require takedown as before but with a more extensive excision of her left colon with mobilization of the splenic flexure. I have explained this to her and we will go ahead and get scheduled to [facility].  Past Medical History  Diagnosis Date  . GOITER, MULTINODULAR 05/04/2009  . HYPOTHYROIDISM 04/21/2009  . HYPERLIPIDEMIA 04/21/2009  . HYPERTENSION 04/21/2009  . ALLERGIC RHINITIS 04/21/2009  . SYNCOPE 01/02/2010  . SEIZURE DISORDER 04/21/2009  . FATIGUE 04/21/2009  . THYROID CANCER, HX OF 04/21/2009  . Osteoporosis 10/28/2011  . Environmental allergies   . Degenerative arthritis of right knee 09/24/2012  . Cancer     thyroid  . Colonic fistula     Past Surgical History  Procedure Laterality Date  . S/p left radius fracture surgury  2001    Dr. Rhona Raider, Ortho.  . S/p thyroid surgury for cancer  1985    biopsy  . Hx of right retenal detachment   Sept. 2005  . S/p right cataract with lens implant    . Left radus    . Laparoscopic sigmoid colectomy  01/30/2012    Procedure: LAPAROSCOPIC SIGMOID COLECTOMY;  Surgeon: Pedro Earls, MD;  [facility];  Service: General;  Laterality: N/A;  Lap Assisted Sigmoid Colectomy with Takedown of Colovesicle Fistula  . Take down of intestinal fistula  01/30/2012    Procedure: TAKE DOWN OF INTESTINAL FISTULA;  Surgeon: Pedro Earls, MD;  [facility];  Service: General;  Laterality: N/A;  . Vesico-vaginal fistula  repair  01/30/2012    Procedure: FISTULA REPAIR VESICO-VAGINAL;  Surgeon: Reece Packer, MD;  [facility];  Service: Urology;  Laterality: N/A;  Enterovesical Fistula Repair  . Orif wrist fracture Left 2003    DR Judd Lien Ortho. [phone]  . Eye surgery    . Colon surgery    . Total knee arthroplasty Right 02/10/2013    Procedure: TOTAL KNEE ARTHROPLASTY;  Surgeon: Hessie Dibble, MD;  Location: [facility];  Service: Orthopedics;  Laterality: Right;    Current Outpatient Prescriptions  Medication Sig Dispense Refill  . acetaminophen (TYLENOL) 500 MG tablet Take 1,000 mg by mouth every 6 (six) hours as needed for pain.      Marland Kitchen aspirin EC 325 MG EC tablet Take 1 tablet (325 mg total) by mouth 2 (two) times daily after a meal.  30 tablet  0  . Calcium Carbonate-Vitamin D (CALTRATE 600+D) 600-400 MG-UNIT per tablet Take 1 tablet by mouth 2 (two) times daily.        . cyclobenzaprine (FLEXERIL) 5 MG tablet Take 1 tablet (5 mg total) by mouth 3 (three) times daily as needed for muscle spasms.  60 tablet  1  . cyclobenzaprine (FLEXERIL) 5 MG tablet TAKE 1 TABLET BY MOUTH THREE TIMES DAILY AS NEEDED FOR MUSCLE SPASMS  90 tablet  0  . EPITOL 200 MG tablet TAKE 1 1/2 TABLETS BY MOUTH TWICE DAILY AND 1 BY  MOUTH AT LUNCH AND DINNER AS DIRECTED  450 tablet  1  . levothyroxine (SYNTHROID, LEVOTHROID) 100 MCG tablet Take 1 tablet (100 mcg total) by mouth every morning.  90 tablet  3  . losartan (COZAAR) 50 MG tablet Take 50 mg by mouth daily.      . Magnesium 250 MG TABS Take by mouth daily.        . methocarbamol (ROBAXIN) 500 MG tablet       . oxyCODONE-acetaminophen (PERCOCET/ROXICET) 5-325 MG per tablet       . polyethylene glycol (MIRALAX / GLYCOLAX) packet Take 17 g by mouth daily as needed (constipation).  14 each  0  . vitamin C (ASCORBIC ACID) 500 MG tablet Take 250 mg by mouth daily.        No current facility-administered medications for this visit.   Sulfonamide derivatives;  Erythromycin; and Latex Family History  Problem Relation Age of Onset  . Heart disease Mother   . Goiter Mother     mother had i-131 rx for a benign goiter  . Heart disease Father   . Stroke Father   . Bipolar disorder Sister   . Heart attack Brother   . Bipolar disorder Brother   . Seizures Other     Strong family history of seizures, 2 brothers and 2 sisters   Social History:   reports that she has quit smoking. She has never used smokeless tobacco. She reports that she does not drink alcohol or use illicit drugs.   REVIEW OF SYSTEMS - PERTINENT POSITIVES ONLY: noncontributory  Physical Exam:   Blood pressure 122/76, pulse 75, temperature 98.4 F (36.9 C), temperature source Oral, resp. rate 14, height 5\' 11"  (1.803 m), weight 145 lb 9.6 oz (66.044 kg). Body mass index is 20.32 kg/(m^2).  Gen:  WDWN white female NAD  Neurological: Alert and oriented to person, place, and time. Motor and sensory function is grossly intact  Head: Normocephalic and atraumatic.  Eyes: Conjunctivae are normal. Pupils are equal, round, and reactive to light. No scleral icterus.  Neck: Normal range of motion. Neck supple. No tracheal deviation or thyromegaly present.  Cardiovascular:  SR without murmurs or gallops.  No carotid bruits Respiratory: Effort normal.  No respiratory distress. No chest wall tenderness. Breath sounds normal.  No wheezes, rales or rhonchi.  Abdomen:  nontender GU: Musculoskeletal: Normal range of motion. Extremities are nontender. No cyanosis, edema or clubbing noted Lymphadenopathy: No cervical, preauricular, postauricular or axillary adenopathy is present Skin: Skin is warm and dry. No rash noted. No diaphoresis. No erythema. No pallor. Pscyh: Normal mood and affect. Behavior is normal. Judgment and thought content normal.   LABORATORY RESULTS: No results found for this or any previous visit (from the past 48 hour(s)).  RADIOLOGY RESULTS: No results found.  Problem  List: Patient Active Problem List   Diagnosis Date Noted  . 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 Degenerative arthritis of right knee 09/24/2012  . Urinary tract infection, site not specified 01/21/2012  . Osteoporosis 10/28/2011  . Menopausal syndrome 09/06/2011  . Preventative health care 08/13/2010  . SYNCOPE 01/02/2010  . GOITER, MULTINODULAR 05/04/2009  . HYPOTHYROIDISM 04/21/2009  . HYPERLIPIDEMIA 04/21/2009  . HYPERTENSION 04/21/2009  . ALLERGIC RHINITIS 04/21/2009  . SEIZURE DISORDER 04/21/2009    Assessment & Plan: Laparoscopically assisted left hemicolectomy and takedown of colovesical fistula.    Matt B. Hassell Done, MD, [facility] [phone] beeper [phone]  07/01/2013  11:09 AM

## 2013-07-28 NOTE — Interval H&P Note (Signed)
History and Physical Interval Note:  07/28/2013 12:31 PM  Tammy Duke  has presented today for surgery, with the diagnosis of recurrent colovesicle fistula/BOWEL FLASH BLADDER FISTULAR  The various methods of treatment have been discussed with the patient and family. After consideration of risks, benefits and other options for treatment, the patient has consented to  Procedure(s): LAPAROSCOPIC SIGMOID COLECTOMY/SPLENIC FLEXURE COLON/TAKEDOWN COLOVESICLE FISTULA (N/A) REPAIR OF INNER ENTEROVESICLE FISTULAR (N/A) as a surgical intervention .  The patient's history has been reviewed, patient examined, no change in status, stable for surgery.  I have reviewed the patient's chart and labs.  Questions were answered to the patient's satisfaction.     Neftaly Inzunza A Naviah Belfield

## 2013-07-28 NOTE — Interval H&P Note (Signed)
History and Physical Interval Note:  07/28/2013 8:50 AM  Tammy Duke  has presented today for surgery, with the diagnosis of recurrent colovesicle fistula/BOWEL FLASH BLADDER FISTULAR  The various methods of treatment have been discussed with the patient and family. After consideration of risks, benefits and other options for treatment, the patient has consented to  Procedure(s): LAPAROSCOPIC SIGMOID COLECTOMY/SPLENIC FLEXURE COLON/TAKEDOWN COLOVESICLE FISTULA (N/A) REPAIR OF INNER ENTEROVESICLE FISTULAR (N/A) as a surgical intervention .  The patient's history has been reviewed, patient examined, no change in status, stable for surgery.  I have reviewed the patient's chart and labs.  Questions were answered to the patient's satisfaction.     Pedro Earls

## 2013-07-28 NOTE — Op Note (Signed)
Preoperative diagnosis: Bowel bladder fistula Postoperative diagnosis: Bowel bladder fistula and possible ovarian abscess to bladder fistula  Surgery: Cystoscopy Surgeon: Dr. Nicki Reaper Samyria Rudie  I was called by Dr. Hassell Done to help assess and treat the above patient with the above diagnoses. The bowel findings and tubo-ovarian findings will be described in detail by general surgery and Dr. Hassell Done.  I agree with Dr. Hassell Done the cystoscoped the patient. A 21 French cystoscope was utilized. The ureteral orifices were far away and caudal from the fistula that was easily identified just to the left of the midline in the posterior dome. Dr. Hassell Done did excellent job of freeing up the abscess collection or fluid collection or cavity that communicated with the bladder and delivered right angle forceps through the opening in the bladder easily identify cystoscopically. I passed a 6 [place] open-ended ureteral catheter through the cystoscope and Dr. Hassell Done brought it up through the hole and into the abdominal cavity.  Because Dr. Hassell Done had density good job with mobilization it was easy for him to close the hole with 2-0 Vicryl pulling back the open-end ureteral catheter as the tied the suture down. He did a second figure-of-eight tension-free. The tissue looked healthy and thickened. There was no obvious active infection.  Dr. Hassell Done plan to apply omentum after doing a diverting colostomy.  It appears that the patient has an excellent prognosis. She'll be followed as per protocol  There is no question the patient had efflux of clear urine from both ureters at the end of the case. A 22 French latex free catheter was applied

## 2013-07-28 NOTE — Transfer of Care (Signed)
Immediate Anesthesia Transfer of Care Note  Patient: Tammy Duke  Procedure(s) Performed: Procedure(s): LAPAROSCOPIC  SPLENIC FLEXURE mobilizationTAKEDOWN inner enterCOLOVESICLE FISTULAr  , lysis of adhesions.hartman procedure, partial salpingoectomey,   cystoscopy  (N/A) cystoscopy (Left)  Patient Location: PACU  Anesthesia Type:General  Level of Consciousness: awake, alert , oriented and patient cooperative  Airway & Oxygen Therapy: Patient Spontanous Breathing and Patient connected to face mask oxygen  Post-op Assessment: Report given to PACU RN and Post -op Vital signs reviewed and stable  Post vital signs: Reviewed and stable  Complications: No apparent anesthesia complications

## 2013-07-29 ENCOUNTER — Encounter (HOSPITAL_COMMUNITY): Payer: Self-pay | Admitting: Surgery

## 2013-07-29 LAB — BASIC METABOLIC PANEL
BUN: 14 mg/dL (ref 6–23)
CHLORIDE: 96 meq/L (ref 96–112)
CO2: 24 meq/L (ref 19–32)
CREATININE: 0.8 mg/dL (ref 0.50–1.10)
Calcium: 7.9 mg/dL — ABNORMAL LOW (ref 8.4–10.5)
GFR calc Af Amer: 84 mL/min — ABNORMAL LOW (ref >90)
GFR calc non Af Amer: 72 mL/min — ABNORMAL LOW (ref >90)
GLUCOSE: 140 mg/dL — AB (ref 70–99)
Potassium: 4.2 mEq/L (ref 3.7–5.3)
Sodium: 129 mEq/L — ABNORMAL LOW (ref 137–147)

## 2013-07-29 LAB — CBC
HEMATOCRIT: 30.5 % — AB (ref 36.0–46.0)
Hemoglobin: 10.2 g/dL — ABNORMAL LOW (ref 12.0–15.0)
MCH: 32 pg (ref 26.0–34.0)
MCHC: 33.4 g/dL (ref 30.0–36.0)
MCV: 95.6 fL (ref 78.0–100.0)
Platelets: 218 10*3/uL (ref 150–400)
RBC: 3.19 MIL/uL — ABNORMAL LOW (ref 3.87–5.11)
RDW: 13.4 % (ref 11.5–15.5)
WBC: 9.3 10*3/uL (ref 4.0–10.5)

## 2013-07-29 MED ORDER — CARBAMAZEPINE 200 MG PO TABS
200.0000 mg | ORAL_TABLET | Freq: Two times a day (BID) | ORAL | Status: DC
  Administered 2013-07-29 – 2013-08-05 (×15): 200 mg via ORAL
  Filled 2013-07-29 (×16): qty 1

## 2013-07-29 MED ORDER — BOOST / RESOURCE BREEZE PO LIQD
1.0000 | Freq: Three times a day (TID) | ORAL | Status: DC
  Administered 2013-07-29 – 2013-08-05 (×14): 1 via ORAL

## 2013-07-29 MED ORDER — METOPROLOL TARTRATE 1 MG/ML IV SOLN
1.0000 mg | Freq: Once | INTRAVENOUS | Status: AC
  Administered 2013-07-29: 1 mg via INTRAVENOUS

## 2013-07-29 MED ORDER — LOSARTAN POTASSIUM 50 MG PO TABS
50.0000 mg | ORAL_TABLET | Freq: Every morning | ORAL | Status: DC
  Administered 2013-07-29 – 2013-08-05 (×8): 50 mg via ORAL
  Filled 2013-07-29 (×9): qty 1

## 2013-07-29 MED ORDER — CARBAMAZEPINE 200 MG PO TABS
300.0000 mg | ORAL_TABLET | Freq: Two times a day (BID) | ORAL | Status: DC
  Administered 2013-07-29 – 2013-08-05 (×15): 300 mg via ORAL
  Filled 2013-07-29 (×18): qty 1.5

## 2013-07-29 MED ORDER — FUROSEMIDE 10 MG/ML IJ SOLN
20.0000 mg | Freq: Once | INTRAMUSCULAR | Status: AC
  Administered 2013-07-29: 20 mg via INTRAVENOUS
  Filled 2013-07-29: qty 2

## 2013-07-29 MED ORDER — LEVOTHYROXINE SODIUM 100 MCG PO TABS
100.0000 ug | ORAL_TABLET | Freq: Every day | ORAL | Status: DC
  Administered 2013-07-29 – 2013-08-05 (×8): 100 ug via ORAL
  Filled 2013-07-29 (×9): qty 1

## 2013-07-29 MED ORDER — METOPROLOL TARTRATE 1 MG/ML IV SOLN
INTRAVENOUS | Status: AC
  Filled 2013-07-29: qty 5

## 2013-07-29 MED ORDER — CARBAMAZEPINE 200 MG PO TABS: 200.0000 mg | ORAL_TABLET | Freq: Four times a day (QID) | ORAL | Status: DC

## 2013-07-29 MED ORDER — METOPROLOL TARTRATE 25 MG/10 ML ORAL SUSPENSION
12.5000 mg | Freq: Two times a day (BID) | ORAL | Status: DC
  Administered 2013-07-29 – 2013-08-05 (×14): 12.5 mg via ORAL
  Filled 2013-07-29 (×17): qty 5

## 2013-07-29 MED ORDER — TIZANIDINE HCL 2 MG PO TABS
2.0000 mg | ORAL_TABLET | Freq: Four times a day (QID) | ORAL | Status: DC | PRN
  Administered 2013-08-02: 2 mg via ORAL
  Filled 2013-07-29 (×2): qty 1

## 2013-07-29 NOTE — Anesthesia Postprocedure Evaluation (Signed)
  Anesthesia Post-op Note  Patient: Tammy Duke  Procedure(s) Performed: Procedure(s) (LRB): LAPAROSCOPIC  SPLENIC FLEXURE mobilizationTAKEDOWN inner enterCOLOVESICLE FISTULAr  , lysis of adhesions.hartman procedure, partial salpingoectomey,   cystoscopy  (N/A) cystoscopy (Left)  Patient Location: PACU  Anesthesia Type: General  Level of Consciousness: awake and alert   Airway and Oxygen Therapy: Patient Spontanous Breathing  Post-op Pain: mild  Post-op Assessment: Post-op Vital signs reviewed, Patient's Cardiovascular Status Stable, Respiratory Function Stable, Patent Airway and No signs of Nausea or vomiting  Last Vitals:  Filed Vitals:   07/29/13 1200  BP: 164/82  Pulse: 79  Temp: 37.1 C  Resp: 17    Post-op Vital Signs: stable   Complications: No apparent anesthesia complications

## 2013-07-29 NOTE — Progress Notes (Signed)
Patient ID: Tammy Duke, female   DOB: December 26, 1940, 73 y.o.   MRN: [id number] [facility] Surgery Progress Note:   1 Day Post-Op  Subjective: Mental status is clear.  Discussed surgery Objective: Vital signs in last 24 hours: Temp:  [97.5 F (36.4 C)-99.4 F (37.4 C)] 98.8 F (37.1 C) (04/29 0800) Pulse Rate:  [78-108] 83 (04/29 0800) Resp:  [14-34] 25 (04/29 0800) BP: (112-191)/(58-158) 160/76 mmHg (04/29 0800) SpO2:  [98 %-100 %] 100 % (04/29 0800) Weight:  [151 lb 10.8 oz (68.8 kg)] 151 lb 10.8 oz (68.8 kg) (04/29 0500)  Intake/Output from previous day: 04/28 0701 - 04/29 0700 In: 5178.3 [I.V.:5178.3] Out: 2340 [Urine:1225; Drains:815; Blood:300] Intake/Output this shift: Total I/O In: 100 [I.V.:100] Out: 300 [Urine:300]  Physical Exam: Work of breathing is normal.  Foley in place and will need to stay in place.    Lab Results:  Results for orders placed during the hospital encounter of 07/28/13 (from the past 48 hour(s))  CBC     Status: Abnormal   Collection Time    07/28/13  7:43 PM      Result Value Ref Range   WBC 16.6 (*) 4.0 - 10.5 K/uL   RBC 3.37 (*) 3.87 - 5.11 MIL/uL   Hemoglobin 11.2 (*) 12.0 - 15.0 g/dL   HCT 31.4 (*) 36.0 - 46.0 %   MCV 93.2  78.0 - 100.0 fL   MCH 33.2  26.0 - 34.0 pg   MCHC 35.7  30.0 - 36.0 g/dL   RDW 13.2  11.5 - 15.5 %   Platelets 236  150 - 400 K/uL  CREATININE, SERUM     Status: Abnormal   Collection Time    07/28/13  7:43 PM      Result Value Ref Range   Creatinine, Ser 0.63  0.50 - 1.10 mg/dL   GFR calc non Af Amer 88 (*) >90 mL/min   GFR calc Af Amer >90  >90 mL/min   Comment: (NOTE)     The eGFR has been calculated using the CKD EPI equation.     This calculation has not been validated in all clinical situations.     eGFR's persistently <90 mL/min signify possible Chronic Kidney     Disease.  BASIC METABOLIC PANEL     Status: Abnormal   Collection Time    07/29/13  3:18 AM      Result Value Ref Range    Sodium 129 (*) 137 - 147 mEq/L   Potassium 4.2  3.7 - 5.3 mEq/L   Chloride 96  96 - 112 mEq/L   CO2 24  19 - 32 mEq/L   Glucose, Bld 140 (*) 70 - 99 mg/dL   BUN 14  6 - 23 mg/dL   Creatinine, Ser 0.80  0.50 - 1.10 mg/dL   Calcium 7.9 (*) 8.4 - 10.5 mg/dL   GFR calc non Af Amer 72 (*) >90 mL/min   GFR calc Af Amer 84 (*) >90 mL/min   Comment: (NOTE)     The eGFR has been calculated using the CKD EPI equation.     This calculation has not been validated in all clinical situations.     eGFR's persistently <90 mL/min signify possible Chronic Kidney     Disease.  CBC     Status: Abnormal   Collection Time    07/29/13  3:18 AM      Result Value Ref Range   WBC 9.3  4.0 - 10.5 K/uL  RBC 3.19 (*) 3.87 - 5.11 MIL/uL   Hemoglobin 10.2 (*) 12.0 - 15.0 g/dL   HCT 30.5 (*) 36.0 - 46.0 %   MCV 95.6  78.0 - 100.0 fL   MCH 32.0  26.0 - 34.0 pg   MCHC 33.4  30.0 - 36.0 g/dL   RDW 13.4  11.5 - 15.5 %   Platelets 218  150 - 400 K/uL    Radiology/Results: No results found.  Anti-infectives: Anti-infectives   Start     Dose/Rate Route Frequency Ordered Stop   07/28/13 2200  cefoTEtan (CEFOTAN) 2 g in dextrose 5 % 50 mL IVPB     2 g 100 mL/hr over 30 Minutes Intravenous Every 12 hours 07/28/13 1810 07/28/13 2131   07/28/13 0633  cefoTEtan (CEFOTAN) 2 g in dextrose 5 % 50 mL IVPB     2 g 100 mL/hr over 30 Minutes Intravenous On call to O.R. 07/28/13 1597 07/28/13 0914      Assessment/Plan: Problem List: Patient Active Problem List   Diagnosis Date Noted  . 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 Degenerative arthritis of right knee 09/24/2012  . Urinary tract infection, site not specified 01/21/2012  . Osteoporosis 10/28/2011  . Menopausal syndrome 09/06/2011  . Preventative health care 08/13/2010  . SYNCOPE 01/02/2010  . GOITER, MULTINODULAR 05/04/2009  . HYPOTHYROIDISM 04/21/2009  . HYPERLIPIDEMIA 04/21/2009  . HYPERTENSION 04/21/2009   . ALLERGIC RHINITIS 04/21/2009  . SEIZURE DISORDER 04/21/2009    Doing well thus far.  Will transfer to floor and begin clear liquids.   1 Day Post-Op    LOS: 1 day   Matt B. Hassell Done, MD, [facility] [phone] beeper [phone]  07/29/2013 9:13 AM

## 2013-07-29 NOTE — Progress Notes (Signed)
CARE MANAGEMENT NOTE 07/29/2013  Patient:  Tammy Duke, Tammy Duke   Account Number:  [id number]  Date Initiated:  07/29/2013  Documentation initiated by:  DAVIS,RHONDA  Subjective/Objective Assessment:   LAPAROSCOPIC  SPLENIC FLEXURE mobilizationTAKEDOWN inner enterCOLOVESICLE FISTULAr  , lysis of adhesions.hartman procedure, partial salpingoectomey,   cystoscopy  (N/A)  cystoscopy (Left)     Action/Plan:   will follow for needs/does live alone/support groups is her friends and neighbors   Anticipated DC Date:  08/01/2013   Anticipated DC Plan:  HOME/SELF CARE  In-house referral  NA      DC Planning Services  NA      [facility] Choice  NA   Choice offered to / List presented to:  NA   DME arranged  NA      DME agency  NA     [place] arranged  NA      [place] agency  NA   Status of service:  In process, will continue to follow Medicare Important Message given?  NA - LOS <3 / Initial given by admissions (If response is "NO", the following Medicare IM given date fields will be blank) Date Medicare IM given:   Date Additional Medicare IM given:    Discharge Disposition:    Per UR Regulation:  Reviewed for med. necessity/level of care/duration of stay  If discussed at [place] of Stay Meetings, dates discussed:    Comments:  [id number] Eldridge Dace, [place], [place] [phone] Chart Reviewed for discharge and hospital needs. Discharge needs at time of review: None present will follow for needs. Review of patient progress due on [id number].

## 2013-07-29 NOTE — Progress Notes (Signed)
Pt with a 6 sec run of svt. Hr in 170's. Pt also with b/p still in 010'U systolic. Pt pain also not well controlled with morphine iv. Called md on call awaiting call back.

## 2013-07-29 NOTE — Progress Notes (Signed)
INITIAL NUTRITION ASSESSMENT  DOCUMENTATION CODES Per approved criteria  -Not Applicable   INTERVENTION: - Diet advancement per MD - Discussed diet for new colostomy with emphasis on low fiber diet for the next 6 weeks. Pt aware of this diet and had discussed with surgeon.  - Resource Breeze TID - Will continue to monitor   NUTRITION DIAGNOSIS: Inadequate oral intake related to clear liquid diet as evidenced by diet order.   Goal: Advance diet as tolerated to low fiber diet  Monitor:  Weights, labs, diet advancement, colostomy output   Reason for Assessment: Malnutrition screening tool   73 y.o. female  Admitting Dx: Recurrent enterovesical fistula   ASSESSMENT: Pt with hx of bladder bowel fistula repaired in 2013. In the last 2 weeks, she reported some feces in her urine. She had some constipation treated with MiraLax and Dulcolax. She had recent knee surgery. 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    Met with pt who reports good appetite at home, on a high fiber diet (45g/day per pt report) and was eating 3 healthy meals/day. States her weight went down 10 pounds unintentionally in the past 3 months due to watching what she was eating. Not on any nutritional supplements at home. Performed nutrition focused physical exam which was WNL except for mild fat loss in clavicles.     Height: Ht Readings from Last 1 Encounters:  07/28/13 '5\' 11"'  (1.803 m)    Weight: Wt Readings from Last 1 Encounters:  07/29/13 151 lb 10.8 oz (68.8 kg)    Ideal Body Weight: 155 lbs   % Ideal Body Weight: 97%  Wt Readings from Last 10 Encounters:  07/29/13 151 lb 10.8 oz (68.8 kg)  07/29/13 151 lb 10.8 oz (68.8 kg)  07/22/13 145 lb (65.772 kg)  07/01/13 145 lb 9.6 oz (66.044 kg)  06/04/13 149 lb 9.6 oz (67.858 kg)  03/11/13  161 lb (73.029 kg)  02/11/13 165 lb (74.844 kg)  02/11/13 165 lb (74.844 kg)  02/02/13 167 lb 8 oz (75.978 kg)  10/21/12 166 lb 11.2 oz (75.615 kg)    Usual Body Weight: 161 lbs per pt  % Usual Body Weight: 94%  BMI:  Body mass index is 21.16 kg/(m^2).  Estimated Nutritional Needs: Kcal: 2000-2200 Protein: 85-105g Fluid: 2-2.2L/day  Skin: LLQ colostomy, abdominal and perineum incision   Diet Order: Clear Liquid  EDUCATION NEEDS: -Education needs addressed   Intake/Output Summary (Last 24 hours) at 07/29/13 1042 Last data filed at 07/29/13 0900  Gross per 24 hour  Intake 4378.33 ml  Output   2340 ml  Net 2038.33 ml    Last BM: PTA  Labs:   Recent Labs Lab 07/28/13 1943 07/29/13 0318  NA  --  129*  K  --  4.2  CL  --  96  CO2  --  24  BUN  --  14  CREATININE 0.63 0.80  CALCIUM  --  7.9*  GLUCOSE  --  140*    CBG (last 3)  No results found for this basename: GLUCAP,  in the last 72 hours  Scheduled Meds: . carbamazepine  200 mg Oral BID WC  . carbamazepine  300 mg Oral BID  . heparin subcutaneous  5,000 Units Subcutaneous 3 times per day  . levothyroxine  100 mcg Oral QAC breakfast  . losartan  50 mg Oral q  morning - 10a    Continuous Infusions: . dextrose 5 % and 0.45 % NaCl with KCl 20 mEq/L 100 mL/hr at 07/29/13 0106  . lactated ringers      Past Medical History  Diagnosis Date  . GOITER, MULTINODULAR 05/04/2009  . HYPOTHYROIDISM 04/21/2009  . HYPERTENSION 04/21/2009  . ALLERGIC RHINITIS 04/21/2009  . SYNCOPE 01/02/2010  . FATIGUE 04/21/2009  . THYROID CANCER, HX OF 04/21/2009  . Osteoporosis 10/28/2011  . Environmental allergies   . Degenerative arthritis of right knee 09/24/2012  . Colonic fistula   . Cancer 1984    thyroid  . SEIZURE DISORDER     "under control"  . Headache(784.0)     hx of  . H/O: whooping cough 73 years old  . GERD (gastroesophageal reflux disease)     "tums controls it"  . Back spasm     Past Surgical History   Procedure Laterality Date  . S/p left radius fracture surgury  2001    Dr. Rhona Raider, Ortho.  . S/p thyroid surgury for cancer  1985    biopsy  . Hx of right retenal detachment   Sept. 2005    partial  . S/p right cataract with lens implant    . Laparoscopic sigmoid colectomy  01/30/2012    Procedure: LAPAROSCOPIC SIGMOID COLECTOMY;  Surgeon: Pedro Earls, MD;  [facility];  Service: General;  Laterality: N/A;  Lap Assisted Sigmoid Colectomy with Takedown of Colovesicle Fistula  . Take down of intestinal fistula  01/30/2012    Procedure: TAKE DOWN OF INTESTINAL FISTULA;  Surgeon: Pedro Earls, MD;  [facility];  Service: General;  Laterality: N/A;  . Vesico-vaginal fistula repair  01/30/2012    Procedure: FISTULA REPAIR VESICO-VAGINAL;  Surgeon: Reece Packer, MD;  [facility];  Service: Urology;  Laterality: N/A;  Enterovesical Fistula Repair  . Colon surgery    . Total knee arthroplasty Right 02/10/2013    Procedure: TOTAL KNEE ARTHROPLASTY;  Surgeon: Hessie Dibble, MD;  Location: [place];  Service: Orthopedics;  Laterality: Right;  . Fracture surgery      left arm    Mikey College MS, RD, LDN [phone] Pager [phone] After Hours Pager

## 2013-07-29 NOTE — Consult Note (Signed)
WOC ostomy consult note Stoma type/location:  LLQ Colostomy (near midline) s/p Hartmann's procedure due to recurrent colovesicle fistula. Stomal assessment/size: pouch not removed, but stoma appears 1 5/8" dark and moist. Budded. No effluent in pouch, but blood tinged liquid present.   Peristomal assessment:not assessed this visit.   Treatment options for stomal/peristomal skin: none at this visit.   Output blood tinged output. Ostomy pouching: 1pc.  Will switch to 2 1/4" 2 piece system at pouch change tomorrow.  Kellie Simmering [phone] and [phone]) Education provided: Educational booklet provided to patient.  Patient discussed that she is hopeful to only have colostomy for 3 months.  WOC RN indicated that we would assist with pouch changes and self care.  Patient would benefit from [facility] for further teaching once discharged.  [place] team will continue to follow and remain available to patient, medical and nursing teams.  Domenic Moras RN BSN [place] Pager [phone]

## 2013-07-29 NOTE — Op Note (Signed)
NAME:  Tammy Duke, Tammy Duke                 ACCOUNT NO.:  MEDICAL RECORD NO.:  [id number]  LOCATION:                                 FACILITY:  PHYSICIAN:  Rodman Key B. Hassell Done, MD  DATE OF BIRTH:  1940-05-04  DATE OF PROCEDURE: DATE OF DISCHARGE:                              OPERATIVE REPORT   PREOPERATIVE DIAGNOSIS:  Colovesical fistula.  POSTOPERATIVE DIAGNOSIS:  Colo to fallopian tube to bladder fistula.  PROCEDURES:  Laparoscopy with mobilization of the splenic flexure, open takedown of multiple interloop small bowel abscesses and adhesions, requiring 2 small-bowel resections, takedown of fallopian tube to bladder fistula, cystoscopy by Dr. Matilde Sprang dictated separately with identification of the critical anatomy of the bladder, cannulation of the hole and primary closure with 0 Vicryl sutures, end-colostomy with Hartmann pouch creation.  SURGEON:  Isabel Caprice. Hassell Done, MD.  ASSISTANTS:  Odis Hollingshead, M.D. and Reece Packer, MD as the urologist, performing cystoscopy and identification of structures as dictated separately.  DESCRIPTION OF PROCEDURE:  The patient was taken to room 11 and given general anesthesia.  The abdomen was prepped with PCMX and padded stirrups.  A Foley was placed.  Latex-free products were used.  Entered the abdomen through the left upper quadrant using 0 degree 5 mm scope without difficulty.  Following insufflation, I placed three other trocar sites using old scars in the midline.  I used Harmonic Scalpel to take down the left colon, descending colon, and splenic flexure in anticipation of more of a left colon resection.  I then began working down in the pelvis and found small bowel loops were densely fused around the colon and in the pelvis.  This prompted [place] to open using a first hand port, using gel pad, and then pelvic exploration using digital palpation, finger fracture in sharp dissection.  Two loops of bowel were significantly  compromised, one opened and trying to extract from the pelvis.  Subsequently, these 2 areas, which were in the terminal ileum, were resected with short sections of resection, primary anastomosis with the GIA stapler, and closure of the common defect with a TA 60 stapler with a blue load.  Following completion of the dissection, I went down below and inserted the rigid proctoscope and get up to the anastomosis, which was free.  The anastomosis appeared intact and did not leak.  There appeared to be more of an opening possibly down below and on further exploration, it appeared when Dr. Matilde Sprang was coping that the opening was to the left side and entered into the left tube structures.  I ended up discovering this by taking down the left tube structures from this and actually putting a right angle clamp into the bladder.  Through this, I was able to grasp the catheter that Dr. Matilde Sprang inserted, we pulled it through as a stent and with that in place, I closed this whole defect with first a U suture and then two figure-of-eight sutures of 2-0 and 0 Vicryl.  Down in this area, we ultimately put a place to drain as well as brought some omentum down in that area.  I elected to put the 19 Blake drain through a  trocar site on the left side, and bring the ostomy out in a separate incision.  I divided the colon about 3 fingerbreadths proximal to the prior anastomosis leaving a long Hartmann pouch, which was marked with Prolene and tacked to the anterior abdominal wall, just below the lower margin of the incision and then the ostomy was brought out through the left lower quadrant and matured with 3-0 chromic sutures.  The appendix was on a very mobile cecum, was flopping down in the lower midline, so we went ahead and removed that as well using a TA 60 stapler as well.  We changed all of our gown, gloves, and got a new equipment, irrigated and then closed with 2-0 Vicryl on the peritoneum and  then with interrupted #1 Novafil.  Wound was then irrigated and closed with staples.  The ostomy was matured.  The patient was taken to the recovery room in satisfactory condition.     Isabel Caprice Hassell Done, MD     MBM/MEDQ  D:  07/28/2013  T:  07/29/2013  Job:  [id number]  cc:   Reece Packer, MD Fax: [phone]

## 2013-07-30 LAB — BASIC METABOLIC PANEL
BUN: 7 mg/dL (ref 6–23)
CALCIUM: 8.1 mg/dL — AB (ref 8.4–10.5)
CO2: 26 mEq/L (ref 19–32)
Chloride: 99 mEq/L (ref 96–112)
Creatinine, Ser: 0.55 mg/dL (ref 0.50–1.10)
GFR calc Af Amer: >90 mL/min (ref >90)
GFR calc non Af Amer: >90 mL/min (ref >90)
Glucose, Bld: 118 mg/dL — ABNORMAL HIGH (ref 70–99)
Potassium: 3.7 mEq/L (ref 3.7–5.3)
Sodium: 134 mEq/L — ABNORMAL LOW (ref 137–147)

## 2013-07-30 LAB — CBC
HCT: 29.3 % — ABNORMAL LOW (ref 36.0–46.0)
Hemoglobin: 9.8 g/dL — ABNORMAL LOW (ref 12.0–15.0)
MCH: 32.3 pg (ref 26.0–34.0)
MCHC: 33.4 g/dL (ref 30.0–36.0)
MCV: 96.7 fL (ref 78.0–100.0)
PLATELETS: 183 10*3/uL (ref 150–400)
RBC: 3.03 MIL/uL — ABNORMAL LOW (ref 3.87–5.11)
RDW: 13.6 % (ref 11.5–15.5)
WBC: 6.9 10*3/uL (ref 4.0–10.5)

## 2013-07-30 NOTE — Consult Note (Signed)
WOC ostomy consult note Stoma type/location:  LLQ colostomy, near midline.  Stomal assessment/size: 1 1/4" round, pink and moist.  Peristomal assessment: Intact.  Treatment options for stomal/peristomal skin: None needed.  Output Blood tinged liquid. Ostomy pouching: /2pc.  2 1/4" pouching system.  Patient eager to learn self care.  Cut barrier to fit, applied pouch and asked many questions.  Has read educational booklet.  Felt relieved once pouch change complete and felt more at ease with self care. Will benefit from further educational sessions. Supplies ordered and in room.

## 2013-07-30 NOTE — Progress Notes (Signed)
Spoke to Dr Hassell Done re f/up with pt cystogram

## 2013-07-30 NOTE — Progress Notes (Signed)
Attempted several times to get pt to turn in bed or to ambulate or sit on side of bed pt has refused. Will continue to monitor.

## 2013-07-31 LAB — CBC
HCT: 25.3 % — ABNORMAL LOW (ref 36.0–46.0)
Hemoglobin: 9 g/dL — ABNORMAL LOW (ref 12.0–15.0)
MCH: 33.3 pg (ref 26.0–34.0)
MCHC: 35.6 g/dL (ref 30.0–36.0)
MCV: 93.7 fL (ref 78.0–100.0)
PLATELETS: 172 10*3/uL (ref 150–400)
RBC: 2.7 MIL/uL — ABNORMAL LOW (ref 3.87–5.11)
RDW: 13.3 % (ref 11.5–15.5)
WBC: 6.2 10*3/uL (ref 4.0–10.5)

## 2013-07-31 LAB — BASIC METABOLIC PANEL
BUN: 3 mg/dL — ABNORMAL LOW (ref 6–23)
CO2: 26 meq/L (ref 19–32)
Calcium: 8.1 mg/dL — ABNORMAL LOW (ref 8.4–10.5)
Chloride: 95 mEq/L — ABNORMAL LOW (ref 96–112)
Creatinine, Ser: 0.48 mg/dL — ABNORMAL LOW (ref 0.50–1.10)
GFR calc Af Amer: >90 mL/min (ref >90)
GFR calc non Af Amer: >90 mL/min (ref >90)
Glucose, Bld: 115 mg/dL — ABNORMAL HIGH (ref 70–99)
Potassium: 3.8 mEq/L (ref 3.7–5.3)
SODIUM: 129 meq/L — AB (ref 137–147)

## 2013-07-31 NOTE — Progress Notes (Signed)
Patient ID: Tammy Duke, female   DOB: [birth year], 73 y.o.   MRN: [id number] [facility] Progress Note:   3 Days Post-Op  Subjective: Mental status is clear;  Moved to 5 west Objective: Vital signs in last 24 hours: Temp:  [98.2 F (36.8 C)-98.9 F (37.2 C)] 98.6 F (37 C) (05/01 0507) Pulse Rate:  [79-97] 79 (05/01 0507) Resp:  [16-18] 16 (05/01 0507) BP: (145-175)/(78-101) 175/91 mmHg (05/01 0507) SpO2:  [97 %-100 %] 97 % (05/01 0507)  Intake/Output from previous day: 04/30 0701 - 05/01 0700 In: 2760 [P.O.:360; I.V.:2400] Out: 2685 [Urine:2600; Drains:85] Intake/Output this shift: Total I/O In: 240 [P.O.:240] Out: 800 [Urine:800]  Physical Exam: Work of breathing is normal.  Drinking clears ok.  Foley in place and will leave for now.    Lab Results:  Results for orders placed during the hospital encounter of 07/28/13 (from the past 48 hour(s))  CBC     Status: Abnormal   Collection Time    07/30/13  3:13 AM      Result Value Ref Range   WBC 6.9  4.0 - 10.5 K/uL   RBC 3.03 (*) 3.87 - 5.11 MIL/uL   Hemoglobin 9.8 (*) 12.0 - 15.0 g/dL   HCT 29.3 (*) 36.0 - 46.0 %   MCV 96.7  78.0 - 100.0 fL   MCH 32.3  26.0 - 34.0 pg   MCHC 33.4  30.0 - 36.0 g/dL   RDW 13.6  11.5 - 15.5 %   Platelets 183  150 - 400 K/uL  BASIC METABOLIC PANEL     Status: Abnormal   Collection Time    07/30/13  3:13 AM      Result Value Ref Range   Sodium 134 (*) 137 - 147 mEq/L   Potassium 3.7  3.7 - 5.3 mEq/L   Chloride 99  96 - 112 mEq/L   CO2 26  19 - 32 mEq/L   Glucose, Bld 118 (*) 70 - 99 mg/dL   BUN 7  6 - 23 mg/dL   Creatinine, Ser 0.55  0.50 - 1.10 mg/dL   Calcium 8.1 (*) 8.4 - 10.5 mg/dL   GFR calc non Af Amer >90  >90 mL/min   GFR calc Af Amer >90  >90 mL/min   Comment: (NOTE)     The eGFR has been calculated using the CKD EPI equation.     This calculation has not been validated in all clinical situations.     eGFR's persistently <90 mL/min signify possible Chronic  Kidney     Disease.  CBC     Status: Abnormal   Collection Time    07/31/13  4:25 AM      Result Value Ref Range   WBC 6.2  4.0 - 10.5 K/uL   RBC 2.70 (*) 3.87 - 5.11 MIL/uL   Hemoglobin 9.0 (*) 12.0 - 15.0 g/dL   HCT 25.3 (*) 36.0 - 46.0 %   MCV 93.7  78.0 - 100.0 fL   MCH 33.3  26.0 - 34.0 pg   MCHC 35.6  30.0 - 36.0 g/dL   RDW 13.3  11.5 - 15.5 %   Platelets 172  150 - 400 K/uL  BASIC METABOLIC PANEL     Status: Abnormal   Collection Time    07/31/13  4:25 AM      Result Value Ref Range   Sodium 129 (*) 137 - 147 mEq/L   Potassium 3.8  3.7 - 5.3 mEq/L  Chloride 95 (*) 96 - 112 mEq/L   CO2 26  19 - 32 mEq/L   Glucose, Bld 115 (*) 70 - 99 mg/dL   BUN 3 (*) 6 - 23 mg/dL   Creatinine, Ser 0.48 (*) 0.50 - 1.10 mg/dL   Calcium 8.1 (*) 8.4 - 10.5 mg/dL   GFR calc non Af Amer >90  >90 mL/min   GFR calc Af Amer >90  >90 mL/min   Comment: (NOTE)     The eGFR has been calculated using the CKD EPI equation.     This calculation has not been validated in all clinical situations.     eGFR's persistently <90 mL/min signify possible Chronic Kidney     Disease.    Radiology/Results: No results found.  Anti-infectives: Anti-infectives   Start     Dose/Rate Route Frequency Ordered Stop   07/28/13 2200  cefoTEtan (CEFOTAN) 2 g in dextrose 5 % 50 mL IVPB     2 g 100 mL/hr over 30 Minutes Intravenous Every 12 hours 07/28/13 1810 07/28/13 2131   07/28/13 0633  cefoTEtan (CEFOTAN) 2 g in dextrose 5 % 50 mL IVPB     2 g 100 mL/hr over 30 Minutes Intravenous On call to O.R. 07/28/13 6060 07/28/13 0914      Assessment/Plan: Problem List: Patient Active Problem List   Diagnosis Date Noted  . 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 Degenerative arthritis of right knee 09/24/2012  . Urinary tract infection, site not specified 01/21/2012  . Osteoporosis 10/28/2011  . Menopausal syndrome 09/06/2011  . Preventative health care 08/13/2010  .  SYNCOPE 01/02/2010  . GOITER, MULTINODULAR 05/04/2009  . HYPOTHYROIDISM 04/21/2009  . HYPERLIPIDEMIA 04/21/2009  . HYPERTENSION 04/21/2009  . ALLERGIC RHINITIS 04/21/2009  . SEIZURE DISORDER 04/21/2009    Continue clears (had two small bowel resections) for now.  Hg drifted down to 9-will observe.  Beta blocker started for hypertension.  3 Days Post-Op    LOS: 3 days   Matt B. Hassell Done, MD, [facility] [phone] beeper [phone]  07/31/2013 12:08 PM

## 2013-08-01 DIAGNOSIS — I10 Essential (primary) hypertension: Secondary | ICD-10-CM

## 2013-08-01 DIAGNOSIS — G40909 Epilepsy, unspecified, not intractable, without status epilepticus: Secondary | ICD-10-CM

## 2013-08-01 LAB — CBC
HEMATOCRIT: 26.4 % — AB (ref 36.0–46.0)
Hemoglobin: 9.3 g/dL — ABNORMAL LOW (ref 12.0–15.0)
MCH: 32.6 pg (ref 26.0–34.0)
MCHC: 35.2 g/dL (ref 30.0–36.0)
MCV: 92.6 fL (ref 78.0–100.0)
PLATELETS: 172 10*3/uL (ref 150–400)
RBC: 2.85 MIL/uL — AB (ref 3.87–5.11)
RDW: 13 % (ref 11.5–15.5)
WBC: 5.5 10*3/uL (ref 4.0–10.5)

## 2013-08-01 LAB — BASIC METABOLIC PANEL
BUN: 3 mg/dL — ABNORMAL LOW (ref 6–23)
CHLORIDE: 93 meq/L — AB (ref 96–112)
CO2: 27 meq/L (ref 19–32)
Calcium: 8.4 mg/dL (ref 8.4–10.5)
Creatinine, Ser: 0.52 mg/dL (ref 0.50–1.10)
GFR calc Af Amer: >90 mL/min (ref >90)
GFR calc non Af Amer: >90 mL/min (ref >90)
Glucose, Bld: 115 mg/dL — ABNORMAL HIGH (ref 70–99)
Potassium: 4.6 mEq/L (ref 3.7–5.3)
Sodium: 128 mEq/L — ABNORMAL LOW (ref 137–147)

## 2013-08-01 MED ORDER — KCL IN DEXTROSE-NACL 20-5-0.9 MEQ/L-%-% IV SOLN
INTRAVENOUS | Status: DC
  Administered 2013-08-01: 12:00:00 via INTRAVENOUS
  Administered 2013-08-02: 75 mL/h via INTRAVENOUS
  Administered 2013-08-02: 17:00:00 via INTRAVENOUS
  Administered 2013-08-03: 30 mL via INTRAVENOUS
  Filled 2013-08-01 (×4): qty 1000

## 2013-08-01 NOTE — Progress Notes (Signed)
4 Days Post-Op  Subjective: Tolerating clear liquids without nausea.  Objective: Vital signs in last 24 hours: Temp:  [98 F (36.7 C)-98.6 F (37 C)] 98 F (36.7 C) (05/02 0536) Pulse Rate:  [69-85] 76 (05/02 0536) Resp:  [16-18] 18 (05/02 0536) BP: (100-185)/(66-93) 176/93 mmHg (05/02 0536) SpO2:  [94 %-98 %] 96 % (05/02 0536) Last BM Date: 07/31/13  Intake/Output from previous day: 05/01 0701 - 05/02 0700 In: 2880 [P.O.:480; I.V.:2400] Out: 6160 [Urine:5700; Drains:60; Stool:400] Intake/Output this shift: Total I/O In: -  Out: 450 [Urine:450]  PE: General- In NAD Abdomen-soft, incisions are clean and intact, colostomy is pink with liquid stool output, drain output is thin, serosanguinous fluid  Lab Results:   Recent Labs  07/31/13 0425 08/01/13 0443  WBC 6.2 5.5  HGB 9.0* 9.3*  HCT 25.3* 26.4*  PLT 172 172   BMET  Recent Labs  07/31/13 0425 08/01/13 0443  NA 129* 128*  K 3.8 4.6  CL 95* 93*  CO2 26 27  GLUCOSE 115* 115*  BUN 3* 3*  CREATININE 0.48* 0.52  CALCIUM 8.1* 8.4   PT/INR No results found for this basename: LABPROT, INR,  in the last 72 hours Comprehensive Metabolic Panel:    Component Value Date/Time   NA 128* 08/01/2013 0443   NA 129* 07/31/2013 0425   K 4.6 08/01/2013 0443   K 3.8 07/31/2013 0425   CL 93* 08/01/2013 0443   CL 95* 07/31/2013 0425   CO2 27 08/01/2013 0443   CO2 26 07/31/2013 0425   BUN 3* 08/01/2013 0443   BUN 3* 07/31/2013 0425   CREATININE 0.52 08/01/2013 0443   CREATININE 0.48* 07/31/2013 0425   GLUCOSE 115* 08/01/2013 0443   GLUCOSE 115* 07/31/2013 0425   CALCIUM 8.4 08/01/2013 0443   CALCIUM 8.1* 07/31/2013 0425   CALCIUM 8.7 11/05/2011 0734   AST 20 09/05/2012 0731   AST 16 09/06/2011 0956   ALT 20 09/05/2012 0731   ALT 18 09/06/2011 0956   ALKPHOS 105 09/05/2012 0731   ALKPHOS 98 09/06/2011 0956   BILITOT 0.7 09/05/2012 0731   BILITOT 0.7 09/06/2011 0956   PROT 6.8 09/05/2012 0731   PROT 6.5 09/06/2011 0956   ALBUMIN 4.2 09/05/2012 0731   ALBUMIN 3.8  09/06/2011 0956     Studies/Results: No results found.  Anti-infectives: Anti-infectives   Start     Dose/Rate Route Frequency Ordered Stop   07/28/13 2200  cefoTEtan (CEFOTAN) 2 g in dextrose 5 % 50 mL IVPB     2 g 100 mL/hr over 30 Minutes Intravenous Every 12 hours 07/28/13 1810 07/28/13 2131   07/28/13 0633  cefoTEtan (CEFOTAN) 2 g in dextrose 5 % 50 mL IVPB     2 g 100 mL/hr over 30 Minutes Intravenous On call to O.R. 07/28/13 0947 07/28/13 0914      Assessment Active Problems:   Colovesical fistula s/p partial colectomy, colostomy, SB resection X 2, repair of bladder fistula 07/28/13-bowel function returning. ABL anemia-stable HTN-BP elevated; on Cozaar and Metoprolol Seizure disorder-on Tegretol    LOS: 4 days   Plan: Advance to full liquids.  Leave foley in.   Rhunette Croft Hernandez [place] 08/01/2013

## 2013-08-02 DIAGNOSIS — E871 Hypo-osmolality and hyponatremia: Secondary | ICD-10-CM

## 2013-08-02 LAB — BASIC METABOLIC PANEL
BUN: 4 mg/dL — AB (ref 6–23)
CHLORIDE: 94 meq/L — AB (ref 96–112)
CO2: 23 mEq/L (ref 19–32)
Calcium: 8.6 mg/dL (ref 8.4–10.5)
Creatinine, Ser: 0.46 mg/dL — ABNORMAL LOW (ref 0.50–1.10)
GFR calc Af Amer: >90 mL/min (ref >90)
GFR calc non Af Amer: >90 mL/min (ref >90)
GLUCOSE: 119 mg/dL — AB (ref 70–99)
Potassium: 4.3 mEq/L (ref 3.7–5.3)
Sodium: 127 mEq/L — ABNORMAL LOW (ref 137–147)

## 2013-08-02 LAB — CBC
HEMATOCRIT: 28.1 % — AB (ref 36.0–46.0)
HEMOGLOBIN: 10.2 g/dL — AB (ref 12.0–15.0)
MCH: 33.1 pg (ref 26.0–34.0)
MCHC: 36.3 g/dL — ABNORMAL HIGH (ref 30.0–36.0)
MCV: 91.2 fL (ref 78.0–100.0)
Platelets: 230 10*3/uL (ref 150–400)
RBC: 3.08 MIL/uL — ABNORMAL LOW (ref 3.87–5.11)
RDW: 13 % (ref 11.5–15.5)
WBC: 8.8 10*3/uL (ref 4.0–10.5)

## 2013-08-02 MED ORDER — OXYCODONE-ACETAMINOPHEN 5-325 MG PO TABS
1.0000 | ORAL_TABLET | ORAL | Status: DC | PRN
  Administered 2013-08-02 – 2013-08-03 (×6): 1 via ORAL
  Administered 2013-08-04 – 2013-08-05 (×4): 2 via ORAL
  Filled 2013-08-02: qty 1
  Filled 2013-08-02: qty 2
  Filled 2013-08-02: qty 1
  Filled 2013-08-02: qty 2
  Filled 2013-08-02 (×2): qty 1
  Filled 2013-08-02 (×3): qty 2
  Filled 2013-08-02: qty 1

## 2013-08-02 NOTE — Progress Notes (Addendum)
5 Days Post-Op  Subjective: Tolerating full liquids.  Walking.  Objective: Vital signs in last 24 hours: Temp:  [98 F (36.7 C)-98.4 F (36.9 C)] 98.1 F (36.7 C) (05/03 0648) Pulse Rate:  [83-99] 83 (05/03 0648) Resp:  [18] 18 (05/03 0648) BP: (145-179)/(84-97) 145/84 mmHg (05/03 0648) SpO2:  [95 %-99 %] 97 % (05/03 0648) Last BM Date: 08/01/13  Intake/Output from previous day: 05/02 0701 - 05/03 0700 In: 1472.5 [P.O.:440; I.V.:1032.5] Out: 4390 [Urine:3700; Drains:40; Stool:650] Intake/Output this shift:    PE: General- In NAD Abdomen-soft, incisions are clean and intact, colostomy is pink with liquid stool output, drain output is thin, serosanguinous fluid  Lab Results:   Recent Labs  08/01/13 0443 08/02/13 0507  WBC 5.5 8.8  HGB 9.3* 10.2*  HCT 26.4* 28.1*  PLT 172 230   BMET  Recent Labs  08/01/13 0443 08/02/13 0507  NA 128* 127*  K 4.6 4.3  CL 93* 94*  CO2 27 23  GLUCOSE 115* 119*  BUN 3* 4*  CREATININE 0.52 0.46*  CALCIUM 8.4 8.6   PT/INR No results found for this basename: LABPROT, INR,  in the last 72 hours Comprehensive Metabolic Panel:    Component Value Date/Time   NA 127* 08/02/2013 0507   NA 128* 08/01/2013 0443   K 4.3 08/02/2013 0507   K 4.6 08/01/2013 0443   CL 94* 08/02/2013 0507   CL 93* 08/01/2013 0443   CO2 23 08/02/2013 0507   CO2 27 08/01/2013 0443   BUN 4* 08/02/2013 0507   BUN 3* 08/01/2013 0443   CREATININE 0.46* 08/02/2013 0507   CREATININE 0.52 08/01/2013 0443   GLUCOSE 119* 08/02/2013 0507   GLUCOSE 115* 08/01/2013 0443   CALCIUM 8.6 08/02/2013 0507   CALCIUM 8.4 08/01/2013 0443   CALCIUM 8.7 11/05/2011 0734   AST 20 09/05/2012 0731   AST 16 09/06/2011 0956   ALT 20 09/05/2012 0731   ALT 18 09/06/2011 0956   ALKPHOS 105 09/05/2012 0731   ALKPHOS 98 09/06/2011 0956   BILITOT 0.7 09/05/2012 0731   BILITOT 0.7 09/06/2011 0956   PROT 6.8 09/05/2012 0731   PROT 6.5 09/06/2011 0956   ALBUMIN 4.2 09/05/2012 0731   ALBUMIN 3.8 09/06/2011 0956      Studies/Results: No results found.  Anti-infectives: Anti-infectives   Start     Dose/Rate Route Frequency Ordered Stop   07/28/13 2200  cefoTEtan (CEFOTAN) 2 g in dextrose 5 % 50 mL IVPB     2 g 100 mL/hr over 30 Minutes Intravenous Every 12 hours 07/28/13 1810 07/28/13 2131   07/28/13 0633  cefoTEtan (CEFOTAN) 2 g in dextrose 5 % 50 mL IVPB     2 g 100 mL/hr over 30 Minutes Intravenous On call to O.R. 07/28/13 6767 07/28/13 0914      Assessment Active Problems:   Colovesical fistula s/p partial colectomy, colostomy, SB resection X 2, repair of bladder fistula 07/28/13-continues to improve. ABL anemia-stable HTN-BP better; on Cozaar and Metoprolol Seizure disorder-on Tegretol  Hyponatremia-IVF changed to NS yesterday    LOS: 5 days   Plan:  Advance diet, decrease IVF.  Leave foley in.  Check BMET tomorrow to monitor hyponatremia.   Tammy Duke 08/02/2013

## 2013-08-03 LAB — BASIC METABOLIC PANEL
BUN: 8 mg/dL (ref 6–23)
CALCIUM: 8.1 mg/dL — AB (ref 8.4–10.5)
CO2: 25 mEq/L (ref 19–32)
CREATININE: 0.53 mg/dL (ref 0.50–1.10)
Chloride: 92 mEq/L — ABNORMAL LOW (ref 96–112)
GFR calc non Af Amer: >90 mL/min (ref >90)
GLUCOSE: 112 mg/dL — AB (ref 70–99)
Potassium: 4.2 mEq/L (ref 3.7–5.3)
Sodium: 125 mEq/L — ABNORMAL LOW (ref 137–147)

## 2013-08-03 LAB — CBC
HCT: 24 % — ABNORMAL LOW (ref 36.0–46.0)
Hemoglobin: 8.4 g/dL — ABNORMAL LOW (ref 12.0–15.0)
MCH: 32.6 pg (ref 26.0–34.0)
MCHC: 35 g/dL (ref 30.0–36.0)
MCV: 93 fL (ref 78.0–100.0)
Platelets: 188 10*3/uL (ref 150–400)
RBC: 2.58 MIL/uL — ABNORMAL LOW (ref 3.87–5.11)
RDW: 13.2 % (ref 11.5–15.5)
WBC: 9 10*3/uL (ref 4.0–10.5)

## 2013-08-03 NOTE — Progress Notes (Signed)
Patient ID: Tammy Duke, female   DOB: 1940/04/13, 73 y.o.   MRN: [id number] [facility] Progress Note:   6 Days Post-Op  Subjective: Mental status is clear.  No complaints Objective: Vital signs in last 24 hours: Temp:  [97.9 F (36.6 C)-99.7 F (37.6 C)] 98.3 F (36.8 C) (05/04 0605) Pulse Rate:  [74-89] 74 (05/04 0605) Resp:  [18] 18 (05/04 0605) BP: (137-166)/(76-85) 146/77 mmHg (05/04 0605) SpO2:  [99 %-100 %] 99 % (05/04 0605)  Intake/Output from previous day: 05/03 0701 - 05/04 0700 In: 2469.3 [P.O.:720; I.V.:1749.3] Out: 1552 [ZHGDJ:2426; Drains:75; Stool:2] Intake/Output this shift:    Physical Exam: Work of breathing is normal.  JP serous mainly  Lab Results:  Results for orders placed during the hospital encounter of 07/28/13 (from the past 48 hour(s))  CBC     Status: Abnormal   Collection Time    08/02/13  5:07 AM      Result Value Ref Range   WBC 8.8  4.0 - 10.5 K/uL   RBC 3.08 (*) 3.87 - 5.11 MIL/uL   Hemoglobin 10.2 (*) 12.0 - 15.0 g/dL   HCT 28.1 (*) 36.0 - 46.0 %   MCV 91.2  78.0 - 100.0 fL   MCH 33.1  26.0 - 34.0 pg   MCHC 36.3 (*) 30.0 - 36.0 g/dL   RDW 13.0  11.5 - 15.5 %   Platelets 230  150 - 400 K/uL   Comment: REPEATED TO VERIFY     DELTA CHECK NOTED  BASIC METABOLIC PANEL     Status: Abnormal   Collection Time    08/02/13  5:07 AM      Result Value Ref Range   Sodium 127 (*) 137 - 147 mEq/L   Potassium 4.3  3.7 - 5.3 mEq/L   Chloride 94 (*) 96 - 112 mEq/L   CO2 23  19 - 32 mEq/L   Glucose, Bld 119 (*) 70 - 99 mg/dL   BUN 4 (*) 6 - 23 mg/dL   Creatinine, Ser 0.46 (*) 0.50 - 1.10 mg/dL   Calcium 8.6  8.4 - 10.5 mg/dL   GFR calc non Af Amer >90  >90 mL/min   GFR calc Af Amer >90  >90 mL/min   Comment: (NOTE)     The eGFR has been calculated using the CKD EPI equation.     This calculation has not been validated in all clinical situations.     eGFR's persistently <90 mL/min signify possible Chronic Kidney     Disease.   CBC     Status: Abnormal   Collection Time    08/03/13  5:06 AM      Result Value Ref Range   WBC 9.0  4.0 - 10.5 K/uL   RBC 2.58 (*) 3.87 - 5.11 MIL/uL   Hemoglobin 8.4 (*) 12.0 - 15.0 g/dL   Comment: DELTA CHECK NOTED     REPEATED TO VERIFY   HCT 24.0 (*) 36.0 - 46.0 %   MCV 93.0  78.0 - 100.0 fL   MCH 32.6  26.0 - 34.0 pg   MCHC 35.0  30.0 - 36.0 g/dL   RDW 13.2  11.5 - 15.5 %   Platelets 188  150 - 400 K/uL  BASIC METABOLIC PANEL     Status: Abnormal   Collection Time    08/03/13  5:06 AM      Result Value Ref Range   Sodium 125 (*) 137 - 147 mEq/L  Potassium 4.2  3.7 - 5.3 mEq/L   Chloride 92 (*) 96 - 112 mEq/L   CO2 25  19 - 32 mEq/L   Glucose, Bld 112 (*) 70 - 99 mg/dL   BUN 8  6 - 23 mg/dL   Creatinine, Ser 0.53  0.50 - 1.10 mg/dL   Calcium 8.1 (*) 8.4 - 10.5 mg/dL   GFR calc non Af Amer >90  >90 mL/min   GFR calc Af Amer >90  >90 mL/min   Comment: (NOTE)     The eGFR has been calculated using the CKD EPI equation.     This calculation has not been validated in all clinical situations.     eGFR's persistently <90 mL/min signify possible Chronic Kidney     Disease.    Radiology/Results: No results found.  Anti-infectives: Anti-infectives   Start     Dose/Rate Route Frequency Ordered Stop   07/28/13 2200  cefoTEtan (CEFOTAN) 2 g in dextrose 5 % 50 mL IVPB     2 g 100 mL/hr over 30 Minutes Intravenous Every 12 hours 07/28/13 1810 07/28/13 2131   07/28/13 0633  cefoTEtan (CEFOTAN) 2 g in dextrose 5 % 50 mL IVPB     2 g 100 mL/hr over 30 Minutes Intravenous On call to O.R. 07/28/13 8850 07/28/13 0914      Assessment/Plan: Problem List: Patient Active Problem List   Diagnosis Date Noted  . 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 Degenerative arthritis of right knee 09/24/2012  . Urinary tract infection, site not specified 01/21/2012  . Osteoporosis 10/28/2011  . Menopausal syndrome 09/06/2011  . Preventative  health care 08/13/2010  . SYNCOPE 01/02/2010  . GOITER, MULTINODULAR 05/04/2009  . HYPOTHYROIDISM 04/21/2009  . HYPERLIPIDEMIA 04/21/2009  . HYPERTENSION 04/21/2009  . ALLERGIC RHINITIS 04/21/2009  . SEIZURE DISORDER 04/21/2009    Diet slowly advancing which she seems to tolerate.  Continue to maintain Foley cath and JP.   6 Days Post-Op    LOS: 6 days   Matt B. Hassell Done, MD, [facility] [phone] beeper [phone]  08/03/2013 7:20 AM

## 2013-08-03 NOTE — Evaluation (Signed)
Physical Therapy Evaluation Patient Details Name: NADIRA SINGLE MRN: [id number] DOB: 1940-09-28 Today's Date: 08/03/2013   History of Present Illness   s/p LAPAROSCOPIC SIGMOID COLECTOMY/SPLENIC FLEXURE COLON/TAKEDOWN COLOVESICLE FISTULA, fistula repair; PMHX: TKA, sz d/o   Clinical Impression  Pt will benefit from PT in acute setting to address deficits below; Will benefit from SNF post acute to allow return to I lifestyle    Follow Up Recommendations SNF    Equipment Recommendations  None recommended by PT    Recommendations for Other Services       Precautions / Restrictions Precautions Precautions: Fall      Mobility  Bed Mobility Overal bed mobility: Needs Assistance Bed Mobility: Supine to Sit     Supine to sit: Min guard     General bed mobility comments: incr time, HOB elevated, cues for use of UEs to sself assist  Transfers Overall transfer level: Needs assistance Equipment used: Rolling walker (2 wheeled) Transfers: Sit to/from Stand Sit to Stand: Min guard         General transfer comment: cues for hand placement  Ambulation/Gait Ambulation/Gait assistance: Min guard;Min assist Ambulation Distance (Feet): 85 Feet Assistive device: Rolling walker (2 wheeled) Gait Pattern/deviations: Step-through pattern     General Gait Details: occasional assist for RW direction and safety; cues for RW distance from self  Stairs            Wheelchair Mobility    Modified Rankin (Stroke Patients Only)       Balance Overall balance assessment: Needs assistance Sitting-balance support: Feet supported;No upper extremity supported Sitting balance-Leahy Scale: Good       Standing balance-Leahy Scale: Fair                               Pertinent Vitals/Pain Denies pain after RW ht adjusted to pt    Home Living Family/patient expects to be discharged to:: Skilled nursing facility Living Arrangements: Alone                     Prior Function           Comments: no AD in house; uses RW when going out; pt does admit to furniture walking     Hand Dominance        Extremity/Trunk Assessment   Upper Extremity Assessment: Defer to OT evaluation           Lower Extremity Assessment: Generalized weakness      Cervical / Trunk Assessment: Kyphotic  Communication   Communication: No difficulties  Cognition Arousal/Alertness: Awake/alert Behavior During Therapy: WFL for tasks assessed/performed Overall Cognitive Status: Within Functional Limits for tasks assessed                      General Comments      Exercises        Assessment/Plan    PT Assessment Patient needs continued PT services  PT Diagnosis Generalized weakness   PT Problem List Decreased strength;Decreased activity tolerance;Decreased balance;Decreased mobility;Decreased knowledge of use of DME  PT Treatment Interventions DME instruction;Gait training;Functional mobility training;Therapeutic activities;Therapeutic exercise;Patient/family education   PT Goals (Current goals can be found in the Care Plan section) Acute Rehab PT Goals Patient Stated Goal: I after rehab PT Goal Formulation: With patient Time For Goal Achievement: 08/10/13 Potential to Achieve Goals: Good    Frequency Min 3X/week   Barriers to discharge  Co-evaluation               End of Session Equipment Utilized During Treatment: Gait belt Activity Tolerance: Patient tolerated treatment well Patient left: in chair;with call bell/phone within reach Nurse Communication: Mobility status         Time: 2536-6440 PT Time Calculation (min): 18 min   Charges:   PT Evaluation $Initial PT Evaluation Tier I: 1 Procedure PT Treatments $Gait Training: 8-22 mins   PT G Codes:          Neil Crouch 08/03/2013, 2:26 PM

## 2013-08-03 NOTE — Consult Note (Signed)
WOC ostomy follow up Stoma type/location: LLQ Colostomy Stomal assessment/size: 1 and 3/8 round, oval at rest Peristomal assessment: intact with erythema from 6-9 o'clock due to seepage of stool beneath skin barrier Treatment options for stomal/peristomal skin: skin barrier ring used Output soft brown stool Ostomy pouching:2pc. 2 and 1/4 inch pouching system and skin barrier ring Education provided: Reinforced previous teaching.  Patient asking appropriate questions.  Is anticipating discharge later in the week to a Rehab facility, [facility] Enrolled patient in [place] Start Discharge program: Yes.  Pouching supplies at bedside. [facility] nursing team will follow, and will remain available to this patient, the nursing, surgical and medical teams.   Thanks, Maudie Flakes, MSN, RN, [place], [place], [place] [phone])

## 2013-08-03 NOTE — Care Management Note (Signed)
    Page 1 of 2   08/03/2013     2:18:29 PM CARE MANAGEMENT NOTE 08/03/2013  Patient:  Tammy Duke, Tammy Duke   Account Number:  [id number]  Date Initiated:  07/29/2013  Documentation initiated by:  DAVIS,RHONDA  Subjective/Objective Assessment:   LAPAROSCOPIC  SPLENIC FLEXURE mobilizationTAKEDOWN inner enterCOLOVESICLE FISTULAr  , lysis of adhesions.hartman procedure, partial salpingoectomey,   cystoscopy  (N/A)  cystoscopy (Left)     Action/Plan:   will follow for needs/does live alone/support groups is her friends and neighbors   Anticipated DC Date:  08/04/2013   Anticipated DC Plan:  [place]  In-house referral  Clinical Social Worker      [place]  CM consult      [facility] Choice  NA   Choice offered to / List presented to:  NA   DME arranged  NA      DME agency  NA     [place] arranged  NA      [place] agency  NA   Status of service:  Completed, signed off Medicare Important Message given?  NA - LOS <3 / Initial given by admissions (If response is "NO", the following Medicare IM given date fields will be blank) Date Medicare IM given:   Date Additional Medicare IM given:  08/03/2013  Discharge Disposition:  [place]  Per UR Regulation:  Reviewed for med. necessity/level of care/duration of stay  If discussed at [place] of Stay Meetings, dates discussed:    Comments:  [id number] Eldridge Dace, [place], [place] [phone] Chart Reviewed for discharge and hospital needs. Discharge needs at time of review: None present will follow for needs. Review of patient progress due on [id number].

## 2013-08-03 NOTE — Progress Notes (Signed)
NUTRITION FOLLOW UP  Intervention:   - Pt eating well without any nutritional concerns at this time, nutrition signing off, please consult if needed   Nutrition Dx:   Inadequate oral intake related to clear liquid diet as evidenced by diet order - resolved, diet advanced, pt eating excellent    Goal:   Advance diet as tolerated to low fiber diet - met   Assessment:   Pt with hx of bladder bowel fistula repaired in 2013. In the last 2 weeks, she reported some feces in her urine. She had some constipation treated with MiraLax and Dulcolax. She had recent knee surgery. 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   4/29 - Met with pt who reports good appetite at home, on a high fiber diet (45g/day per pt report) and was eating 3 healthy meals/day. States her weight went down 10 pounds unintentionally in the past 3 months due to watching what she was eating. Not on any nutritional supplements at home. Performed nutrition focused physical exam which was WNL except for mild fat loss in clavicles.   5/4 - Diet advanced to soft yesterday. Met with pt who reports eating 100% of meals today and drank 2 Resource Breeze today. States she has a good appetite. Reviewed diet therapy for new colostomy and pt aware of importance of adequate protein intake and low fiber diet for the next 6 weeks with gradual addition of fiber into diet after that.    Height: Ht Readings from Last 1 Encounters:  07/28/13 '5\' 11"'  (1.803 m)    Weight Status:   Wt Readings from Last 1 Encounters:  07/29/13 151 lb 10.8 oz (68.8 kg)    Re-estimated needs:  Kcal: 2000-2200  Protein: 85-105g  Fluid: 2-2.2L/day   Skin: LLQ colostomy, abdominal and perineum incision    Diet Order: Criss Rosales   Intake/Output Summary (Last 24 hours) at 08/03/13 1537 Last data filed at  08/03/13 1400  Gross per 24 hour  Intake   1246 ml  Output   1527 ml  Net   -281 ml    Last BM: 5/2   Labs:   Recent Labs Lab 08/01/13 0443 08/02/13 0507 08/03/13 0506  NA 128* 127* 125*  K 4.6 4.3 4.2  CL 93* 94* 92*  CO2 '27 23 25  ' BUN 3* 4* 8  CREATININE 0.52 0.46* 0.53  CALCIUM 8.4 8.6 8.1*  GLUCOSE 115* 119* 112*    CBG (last 3)  No results found for this basename: GLUCAP,  in the last 72 hours  Scheduled Meds: . carbamazepine  200 mg Oral BID WC  . carbamazepine  300 mg Oral BID  . feeding supplement (RESOURCE BREEZE)  1 Container Oral TID BM  . heparin subcutaneous  5,000 Units Subcutaneous 3 times per day  . levothyroxine  100 mcg Oral QAC breakfast  . losartan  50 mg Oral q morning - 10a  . metoprolol tartrate  12.5 mg Oral BID    Continuous Infusions: . dextrose 5 % and 0.9 % NaCl with KCl 20 mEq/L 30 mL/hr at 08/02/13 1715  . lactated ringers       Mikey College MS, RD, LDN [phone] Pager [phone] After Hours Pager

## 2013-08-03 NOTE — Progress Notes (Signed)
Clinical Social Work Department CLINICAL SOCIAL WORK PLACEMENT NOTE 08/03/2013  Patient:  Tammy Duke, Tammy Duke  Account Number:  [id number] Admit date:  07/28/2013  Clinical Social Worker:  Werner Lean, LCSW  Date/time:  08/03/2013 01:37 PM  Clinical Social Work is seeking post-discharge placement for this patient at the following level of care:   SKILLED NURSING   (*CSW will update this form in Epic as items are completed)     Patient/family provided with [place] Department of Clinical Social Work's list of facilities offering this level of care within the geographic area requested by the patient (or if unable, by the patient's family).  08/03/2013  Patient/family informed of their freedom to choose among providers that offer the needed level of care, that participate in Medicare, Medicaid or managed care program needed by the patient, have an available bed and are willing to accept the patient.    Patient/family informed of [facility]' ownership interest in [facility], as well as of the fact that they are under no obligation to receive care at this facility.  PASARR submitted to EDS on 08/03/2013 PASARR number received from EDS on 08/03/2013  FL2 transmitted to all facilities in geographic area requested by pt/family on  08/03/2013 FL2 transmitted to all facilities within larger geographic area on   Patient informed that his/her managed care company has contracts with or will negotiate with  certain facilities, including the following:     Patient/family informed of bed offers received:   Patient chooses bed at  Physician recommends and patient chooses bed at    Patient to be transferred to  on   Patient to be transferred to facility by   The following physician request were entered in Epic:   Additional Comments:  Werner Lean LCSW [phone]

## 2013-08-03 NOTE — Progress Notes (Signed)
Clinical Social Work Department BRIEF PSYCHOSOCIAL ASSESSMENT 08/03/2013  Patient:  Tammy Duke, Tammy Duke     Account Number:  [id number]     Admit date:  07/28/2013  Clinical Social Worker:  Lacie Scotts  Date/Time:  08/03/2013 01:16 PM  Referred by:  Physician  Date Referred:  08/03/2013 Referred for  SNF Placement   Other Referral:   Interview type:  Patient Other interview type:    PSYCHOSOCIAL DATA Living Status:  ALONE Admitted from facility:   Level of care:   Primary support name:  Gordy Clement Primary support relationship to patient:  FRIEND Degree of support available:   supportive    CURRENT CONCERNS Current Concerns  Post-Acute Placement   Other Concerns:    SOCIAL WORK ASSESSMENT / PLAN Pt is a 73 yr old female living at home alone prior to admission. CSW met with pt to assist with d/c planning. Pt was hoping to return home at d/c but is weak and deconditioned r/t medical issues. Pt has a new colostomy, foley cath, and JP drain she is learning to manage. Pt would benefit from [place] prior to retuning home. Pt / MD are in agreement with this plan. Pt has [employer] which requires prior authorization. CSW will assist with this process.   Assessment/plan status:  Psychosocial Support/Ongoing Assessment of Needs Other assessment/ plan:   Information/referral to community resources:   Insurance coverage for SNF and ambulance transport reviewed.    PATIENT'S/FAMILY'S RESPONSE TO PLAN OF CARE: Pt states she is slowly improving. She is concerned about managing her own care, alone, while she is still weak and recovering from surgery. Pt would like to go to [facility] for [employer] before going home. Pt appreciated CSW assistance with d/c planning.   Werner Lean LCSW [phone]

## 2013-08-03 NOTE — Progress Notes (Signed)
Pt accidentally pulled her foley catheter out with balloon still inflated. MD notified. Orders given. Nelva Nay [place]

## 2013-08-04 LAB — BASIC METABOLIC PANEL
BUN: 11 mg/dL (ref 6–23)
CO2: 24 mEq/L (ref 19–32)
Calcium: 8.3 mg/dL — ABNORMAL LOW (ref 8.4–10.5)
Chloride: 93 mEq/L — ABNORMAL LOW (ref 96–112)
Creatinine, Ser: 0.47 mg/dL — ABNORMAL LOW (ref 0.50–1.10)
GFR calc non Af Amer: >90 mL/min (ref >90)
Glucose, Bld: 104 mg/dL — ABNORMAL HIGH (ref 70–99)
POTASSIUM: 4.2 meq/L (ref 3.7–5.3)
SODIUM: 125 meq/L — AB (ref 137–147)

## 2013-08-04 LAB — CBC
HCT: 24.3 % — ABNORMAL LOW (ref 36.0–46.0)
Hemoglobin: 8.7 g/dL — ABNORMAL LOW (ref 12.0–15.0)
MCH: 33.3 pg (ref 26.0–34.0)
MCHC: 35.8 g/dL (ref 30.0–36.0)
MCV: 93.1 fL (ref 78.0–100.0)
Platelets: 210 10*3/uL (ref 150–400)
RBC: 2.61 MIL/uL — ABNORMAL LOW (ref 3.87–5.11)
RDW: 13.4 % (ref 11.5–15.5)
WBC: 7.4 10*3/uL (ref 4.0–10.5)

## 2013-08-04 NOTE — Evaluation (Signed)
Occupational Therapy Evaluation Patient Details Name: Tammy Duke MRN: [id number] DOB: 25-Oct-1940 Today's Date: 08/04/2013    History of Present Illness  s/p LAPAROSCOPIC SIGMOID COLECTOMY/SPLENIC FLEXURE COLON/TAKEDOWN COLOVESICLE FISTULA, fistula repair; PMHX: TKA, sz d/o    Clinical Impression   Pt was admitted for the above surgery. She lives alone and is modified independent to independent at baseline. Pt requires overall min A for adls at this time, and she is managing her colostomy.  She will benefit from skilled OT to increase safety and independence with adls.  Goals in acute are set for mod I level for basic adls.      Follow Up Recommendations  SNF    Equipment Recommendations  3 in 1 bedside comode    Recommendations for Other Services       Precautions / Restrictions Precautions Precautions: Fall Restrictions Weight Bearing Restrictions: No      Mobility Bed Mobility                  Transfers     Transfers: Sit to/from Stand Sit to Stand: Supervision         General transfer comment: pt self-cued; extra time    Balance                                            ADL Overall ADL's : Needs assistance/impaired     Grooming: Wash/dry hands;Supervision/safety;Standing   Upper Body Bathing: Set up;Sitting   Lower Body Bathing: Minimal assistance;Sit to/from stand   Upper Body Dressing : Minimal assistance;Sitting (iv)   Lower Body Dressing: Minimal assistance;Sit to/from stand   Toilet Transfer: Min guard;BSC;RW   Toileting- Water quality scientist and Hygiene: Supervision/safety;Sit to/from stand         General ADL Comments: pt emptied colostomy in bathroom; occasional assist to hand her supplies out of reach.  One cue for safety when ambulating from commode to sink     Vision                     Perception     Praxis      Pertinent Vitals/Pain Abdomen sore     Hand Dominance Right    Extremity/Trunk Assessment Upper Extremity Assessment Upper Extremity Assessment: Overall WFL for tasks assessed           Communication Communication Communication: No difficulties   Cognition Arousal/Alertness: Awake/alert Behavior During Therapy: WFL for tasks assessed/performed Overall Cognitive Status: Within Functional Limits for tasks assessed                     General Comments       Exercises       Shoulder Instructions      Home Living Family/patient expects to be discharged to:: Skilled nursing facility Living Arrangements: Alone                                      Prior Functioning/Environment          Comments: no AD in house; uses RW when going out; pt does admit to furniture walking    OT Diagnosis: Generalized weakness   OT Problem List: Decreased strength;Decreased activity tolerance;Pain   OT Treatment/Interventions: Self-care/ADL training;DME and/or AE instruction;Patient/family education    OT Goals(Current  goals can be found in the care plan section) Acute Rehab OT Goals Patient Stated Goal: independent OT Goal Formulation: With patient Time For Goal Achievement: 08/18/13 Potential to Achieve Goals: Good ADL Goals Pt Will Perform Grooming: with modified independence;standing Pt Will Perform Lower Body Bathing: with modified independence;sit to/from stand Pt Will Perform Lower Body Dressing: with modified independence;sit to/from stand Pt Will Transfer to Toilet: with modified independence;bedside commode;ambulating Pt Will Perform Toileting - Clothing Manipulation and hygiene: with modified independence;sit to/from stand  OT Frequency: Min 2X/week   Barriers to D/C:            Co-evaluation              End of Session    Activity Tolerance: Patient tolerated treatment well Patient left: in chair;with call bell/phone within reach;with nursing/sitter in room   Time: 0824-0858 OT Time Calculation  (min): 34 min Charges:  OT General Charges $OT Visit: 1 Procedure OT Evaluation $Initial OT Evaluation Tier I: 1 Procedure OT Treatments $Self Care/Home Management : 23-37 mins G-Codes:    Lesle Chris 2013/08/08, 9:36 AM  Lesle Chris, OTR/L [phone] 08/08/2013

## 2013-08-04 NOTE — Progress Notes (Signed)
SNF bed available WED at [facility] if pt is stable for d/c. [employer] has provided authorization for SNF / AMB . Pt is aware and in agreement with d/c plan.  Werner Lean LCSW [phone]

## 2013-08-04 NOTE — Progress Notes (Signed)
Patient ID: Tammy Duke, female   DOB: 05-Jan-1941, 73 y.o.   MRN: [id number] [facility] Surgery Progress Note:   7 Days Post-Op  Subjective: Mental status is very alert Objective: Vital signs in last 24 hours: Temp:  [97.9 F (36.6 C)-98.6 F (37 C)] 97.9 F (36.6 C) (05/05 1400) Pulse Rate:  [73-85] 73 (05/05 1400) Resp:  [18-20] 20 (05/05 1400) BP: (125-139)/(72-83) 135/74 mmHg (05/05 1400) SpO2:  [97 %-100 %] 100 % (05/05 1400)  Intake/Output from previous day: 05/04 0701 - 05/05 0700 In: 1246 [P.O.:526; I.V.:720] Out: 1970 [Urine:1925; Drains:45] Intake/Output this shift: Total I/O In: 660 [P.O.:660] Out: 595 [Urine:550; Drains:45]  Physical Exam: Work of breathing is normal.  Incision bland.  JP clear  Lab Results:  Results for orders placed during the hospital encounter of 07/28/13 (from the past 48 hour(s))  CBC     Status: Abnormal   Collection Time    08/03/13  5:06 AM      Result Value Ref Range   WBC 9.0  4.0 - 10.5 K/uL   RBC 2.58 (*) 3.87 - 5.11 MIL/uL   Hemoglobin 8.4 (*) 12.0 - 15.0 g/dL   Comment: DELTA CHECK NOTED     REPEATED TO VERIFY   HCT 24.0 (*) 36.0 - 46.0 %   MCV 93.0  78.0 - 100.0 fL   MCH 32.6  26.0 - 34.0 pg   MCHC 35.0  30.0 - 36.0 g/dL   RDW 13.2  11.5 - 15.5 %   Platelets 188  150 - 400 K/uL  BASIC METABOLIC PANEL     Status: Abnormal   Collection Time    08/03/13  5:06 AM      Result Value Ref Range   Sodium 125 (*) 137 - 147 mEq/L   Potassium 4.2  3.7 - 5.3 mEq/L   Chloride 92 (*) 96 - 112 mEq/L   CO2 25  19 - 32 mEq/L   Glucose, Bld 112 (*) 70 - 99 mg/dL   BUN 8  6 - 23 mg/dL   Creatinine, Ser 0.53  0.50 - 1.10 mg/dL   Calcium 8.1 (*) 8.4 - 10.5 mg/dL   GFR calc non Af Amer >90  >90 mL/min   GFR calc Af Amer >90  >90 mL/min   Comment: (NOTE)     The eGFR has been calculated using the CKD EPI equation.     This calculation has not been validated in all clinical situations.     eGFR's persistently <90 mL/min  signify possible Chronic Kidney     Disease.  CBC     Status: Abnormal   Collection Time    08/04/13  4:28 AM      Result Value Ref Range   WBC 7.4  4.0 - 10.5 K/uL   RBC 2.61 (*) 3.87 - 5.11 MIL/uL   Hemoglobin 8.7 (*) 12.0 - 15.0 g/dL   HCT 24.3 (*) 36.0 - 46.0 %   MCV 93.1  78.0 - 100.0 fL   MCH 33.3  26.0 - 34.0 pg   MCHC 35.8  30.0 - 36.0 g/dL   RDW 13.4  11.5 - 15.5 %   Platelets 210  150 - 400 K/uL  BASIC METABOLIC PANEL     Status: Abnormal   Collection Time    08/04/13  4:28 AM      Result Value Ref Range   Sodium 125 (*) 137 - 147 mEq/L   Potassium 4.2  3.7 - 5.3 mEq/L  Chloride 93 (*) 96 - 112 mEq/L   CO2 24  19 - 32 mEq/L   Glucose, Bld 104 (*) 70 - 99 mg/dL   BUN 11  6 - 23 mg/dL   Creatinine, Ser 0.47 (*) 0.50 - 1.10 mg/dL   Calcium 8.3 (*) 8.4 - 10.5 mg/dL   GFR calc non Af Amer >90  >90 mL/min   GFR calc Af Amer >90  >90 mL/min   Comment: (NOTE)     The eGFR has been calculated using the CKD EPI equation.     This calculation has not been validated in all clinical situations.     eGFR's persistently <90 mL/min signify possible Chronic Kidney     Disease.    Radiology/Results: No results found.  Anti-infectives: Anti-infectives   Start     Dose/Rate Route Frequency Ordered Stop   07/28/13 2200  cefoTEtan (CEFOTAN) 2 g in dextrose 5 % 50 mL IVPB     2 g 100 mL/hr over 30 Minutes Intravenous Every 12 hours 07/28/13 1810 07/28/13 2131   07/28/13 0633  cefoTEtan (CEFOTAN) 2 g in dextrose 5 % 50 mL IVPB     2 g 100 mL/hr over 30 Minutes Intravenous On call to O.R. 07/28/13 9301 07/28/13 0914      Assessment/Plan: Problem List: Patient Active Problem List   Diagnosis Date Noted  . 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 Degenerative arthritis of right knee 09/24/2012  . Urinary tract infection, site not specified 01/21/2012  . Osteoporosis 10/28/2011  . Menopausal syndrome 09/06/2011  . Preventative  health care 08/13/2010  . SYNCOPE 01/02/2010  . GOITER, MULTINODULAR 05/04/2009  . HYPOTHYROIDISM 04/21/2009  . HYPERLIPIDEMIA 04/21/2009  . HYPERTENSION 04/21/2009  . ALLERGIC RHINITIS 04/21/2009  . SEIZURE DISORDER 04/21/2009    Doing well-plan discharge to [facility] place tomorrow.  JP out 7 Days Post-Op    LOS: 7 days   Matt B. Hassell Done, MD, [facility] [phone] beeper [phone]  08/04/2013 2:47 PM

## 2013-08-05 LAB — BASIC METABOLIC PANEL
BUN: 14 mg/dL (ref 6–23)
CHLORIDE: 88 meq/L — AB (ref 96–112)
CO2: 27 meq/L (ref 19–32)
Calcium: 8.4 mg/dL (ref 8.4–10.5)
Creatinine, Ser: 0.59 mg/dL (ref 0.50–1.10)
GFR calc Af Amer: >90 mL/min (ref >90)
GFR calc non Af Amer: >90 mL/min (ref >90)
Glucose, Bld: 100 mg/dL — ABNORMAL HIGH (ref 70–99)
POTASSIUM: 4.9 meq/L (ref 3.7–5.3)
Sodium: 122 mEq/L — ABNORMAL LOW (ref 137–147)

## 2013-08-05 LAB — CBC
HEMATOCRIT: 25.6 % — AB (ref 36.0–46.0)
HEMOGLOBIN: 8.9 g/dL — AB (ref 12.0–15.0)
MCH: 32.6 pg (ref 26.0–34.0)
MCHC: 34.8 g/dL (ref 30.0–36.0)
MCV: 93.8 fL (ref 78.0–100.0)
Platelets: 218 10*3/uL (ref 150–400)
RBC: 2.73 MIL/uL — ABNORMAL LOW (ref 3.87–5.11)
RDW: 13.6 % (ref 11.5–15.5)
WBC: 6.5 10*3/uL (ref 4.0–10.5)

## 2013-08-05 NOTE — Progress Notes (Signed)
Clinical Social Work  CSW faxed DC summary to [facility] who is agreeable to accept patient today. SNF cleaning patient's room and will call CSW when room is ready. CSW prepared DC packet with FL2 and hard scripts included. Patient reports that dtr will bring clothes but she prefers [facility] to transport to facility. CSW alerted RN that patient will DC after lunch. CSW will continue to follow to arrange transportation when facility is ready.  Sindy Messing, LCSW  (Coverage for [employer])

## 2013-08-05 NOTE — Discharge Summary (Signed)
Physician Discharge Summary  Patient ID: Tammy Duke MRN: [id number] DOB/AGE: [birth year] 73 y.o.  Admit date: 07/28/2013 Discharge date: 08/05/2013  Admission Diagnoses:  Colovesical fistula  Discharge Diagnoses:  same  Active Problems:   Colovesical fistula   S/P colostomy   Surgery:  Takedown of fistula with end colostomy and Hartmann pouch  Discharged Condition: improved  Hospital Course:   Had surgery.  Foley left in during the entire stay.  JP in place until day prior to transfer to [facility] for rehab.  Incision bland.  Hyponatremia noted but asymptomatic.  Should correct with diet.    Consults: none  Significant Diagnostic Studies: none    Discharge Exam: Blood pressure 154/90, pulse 86, temperature 99 F (37.2 C), temperature source Oral, resp. rate 18, height 5\' 11"  (1.803 m), weight 151 lb 10.8 oz (68.8 kg), SpO2 99.00%. Incision bland. Staples out.  Foley in place.    Disposition: 03-Skilled Nursing Facility  Discharge Orders   Future Appointments Provider Department Dept Phone   09/14/2013 7:45 AM Hayden Pedro, MD [place] [phone]   Future Orders Complete By Expires   Diet general  As directed    Discharge instructions  As directed    Discharge wound care:  As directed    Increase activity slowly  As directed        Medication List         aspirin EC 81 MG tablet  Take 81 mg by mouth daily.     CALTRATE 600+D 600-400 MG-UNIT per tablet  Generic drug:  Calcium Carbonate-Vitamin D  Take 1 tablet by mouth 2 (two) times daily.     carbamazepine 200 MG tablet  Commonly known as:  TEGRETOL  Take 200-300 mg by mouth 4 (four) times daily. Takes 1.5 tablet every morning and at bedtime and 1 tablet at lunch and dinner     levothyroxine 100 MCG tablet  Commonly known as:  SYNTHROID, LEVOTHROID  Take 100 mcg by mouth daily before breakfast.     losartan 50 MG tablet  Commonly known as:  COZAAR  Take 50 mg by  mouth every morning.     Magnesium 250 MG Tabs  Take 1 tablet by mouth daily.     methocarbamol 500 MG tablet  Commonly known as:  ROBAXIN  Take 500 mg by mouth every 8 (eight) hours as needed.     multivitamin with minerals Tabs tablet  Take 1 tablet by mouth daily.     oxyCODONE-acetaminophen 5-325 MG per tablet  Commonly known as:  PERCOCET/ROXICET  Take 1 tablet by mouth every 8 (eight) hours as needed for moderate pain.     tiZANidine 2 MG tablet  Commonly known as:  ZANAFLEX  Take 2 mg by mouth every 6 (six) hours as needed for muscle spasms.     vitamin C 250 MG tablet  Commonly known as:  ASCORBIC ACID  Take 250 mg by mouth daily.           Follow-up Information   Follow up with MACDIARMID,SCOTT A, MD.   Specialty:  Urology   Contact information:   [facility]  [place] [address] [phone]       Follow up with Pedro Earls, MD. Schedule an appointment as soon as possible for a visit in 4 weeks.   Specialty:  General Surgery   Contact information:   [place]  [phone]       Signed: Pedro Earls 08/05/2013, 8:42 AM

## 2013-08-05 NOTE — Discharge Instructions (Signed)
Colostomy Home Guide °A colostomy is an opening for stool to leave your body when a medical condition prevents it from leaving through the usual opening (rectum). During a surgery, a piece of large intestine (colon) is brought through a hole in the abdominal wall. The new opening is called a stoma or ostomy. A bag or pouch fits over the stoma to catch stool and gas. Your stool may be liquid, somewhat pasty, or formed. °CARING FOR YOUR STOMA  °Normally, the stoma looks a lot like the inside of your cheek: pink, red, and moist. At first it may be swollen, but this swelling will decrease within 6 weeks. °Keep the skin around your stoma clean and dry. You can gently wash your stoma and the skin around your stoma in the shower with a clean, soft washcloth. If you develop any skin irritation, your caregiver may give you a stoma powder or ointment to help heal the area. Do not use any products other than those specifically given to you by your caregiver.  °Your stoma should not be uncomfortable. If you notice any stinging or burning, your pouch may be leaking, and the skin around your stoma may be coming into contact with stool. This can cause skin irritation. If you notice stinging, replace your pouch with a new one and discard the old one. °OSTOMY POUCHES  °The pouch that fits over the ostomy can be made up of either 1 or 2 pieces. A one-piece pouch has a skin barrier piece and the pouch itself in one unit. A two-piece pouch has a skin barrier with a separate pouch that snaps on and off of the skin barrier. Either way, you should empty the pouch when it is only  to ½ full. Do not let more stool or gas build up. This could cause the pouch to leak. °Some ostomy bags have a built-in gas release valve. Ostomy deodorizer (5 drops) can be put into the pouch to prevent odor. Some people use ostomy lubricant drops inside the pouch to help the stool slide out of the bag more easily and completely.  °EMPTYING YOUR OSTOMY POUCH    °You may get lessons on how to empty your pouch from a wound-ostomy nurse before you leave the hospital. Here are the basic steps: °· Wash your hands with soap and water. °· Sit far back on the toilet. °· Put several pieces of toilet paper into the toilet water. This will prevent splashing as you empty the stool into the toilet bowl. °· Unclip or unvelcro the tail end of the pouch. °· Unroll the tail and empty stool into the toilet. °· Clean the tail with toilet paper. °· Reroll the tail, and clip or velcro it closed. °· Wash your hands again. °CHANGING YOUR OSTOMY POUCH  °Change your ostomy pouch about every 3 to 4 days for the first 6 weeks, then every 5 to7 days. Always change the bag sooner if there is any leakage or you begin to notice any discomfort or irritation of the skin around the stoma. When possible, plan to change your ostomy pouch before eating or drinking as this will lessen the chance of stool coming out during the pouch change. A wound-ostomy nurse may teach you how to change your pouch before you leave the hospital. Here are the basic steps: °· Lay out your supplies. °· Wash your hands with soap and water. °· Carefully remove the old pouch. °· Wash the stoma and allow it to dry. Men may be   advised to shave any hair around the stoma very carefully. This will make the adhesive stick better. °· Use the stoma measuring guide that comes with your pouch set to decide what size hole you will need to cut in the skin barrier piece. Choose the smallest possible size that will hold the stoma but will not touch it. °· Use the guide to trace the circle on the back of the skin barrier piece. Cut out the hole. °· Hold the skin barrier piece over the stoma to make sure the hole is the correct size. °· Remove the adhesive paper backing from the skin barrier piece. °· Squeeze stoma paste around the opening of the skin barrier piece. °· Clean and dry the skin around the stoma again. °· Carefully fit the skin  barrier piece over your stoma. °· If you are using a two-piece pouch, snap the pouch onto the skin barrier piece. °· Close the tail of the pouch. °· Put your hand over the top of the skin barrier piece to help warm it for about 5 minutes, so that it conforms to your body better. °· Wash your hands again. °DIET TIPS  °· Continue to follow your usual diet. °· Drink about eight 8 oz glasses of water each day. °· You can prevent gas by eating slowly and chewing your food thoroughly. °· If you feel concerned that you have too much gas, you can cut back on gas-producing foods, such as: °· Spicy foods. °· Onions and garlic. °· Cruciferous vegetables (cabbage, broccoli, cauliflower, Brussels sprouts). °· Beans and legumes. °· Some cheeses. °· Eggs. °· Fish. °· Bubbly (carbonated) drinks. °· Chewing gum. °GENERAL TIPS  °· You can shower with or without the bag in place. °· Always keep the bag on if you are bathing or swimming. °· If your bag gets wet, you can dry it with a blow-dryer set to cool. °· Avoid wearing tight clothing directly over your stoma so that it does not become irritated or bleed. Tight clothing can also prevent stool from draining into the pouch. °· It is helpful to always have an extra skin barrier and pouch with you when traveling. Do not leave them anywhere too warm, as parts of them can melt. °· Do not let your seat belt rest on your stoma. Try to keep the seat belt either above or below your stoma, or use a tiny pillow to cushion it. °· You can still participate in sports, but you should avoid activities in which there is a risk of getting hit in the abdomen. °· You can still have sex. It is a good idea to empty your pouch prior to sex. Some people and their partners feel very comfortable seeing the pouch during sex. Others choose to wear lingerie or a T-shirt that covers the device. °SEEK IMMEDIATE MEDICAL CARE IF: °· You notice a change in the size or color of the stoma, especially if it becomes  very red, purple, black, or pale white. °· You have bloody stools or bleeding from the stoma. °· You have abdominal pain, nausea, vomiting, or bloating. °· There is anything unusual protruding from the stoma. °· You have irritation or red skin around the stoma. °· No stool is passing from the stoma. °· You have diarrhea (requiring more frequent than normal pouch emptying). °Document Released: 03/22/2003 Document Revised: 06/11/2011 Document Reviewed: 08/16/2010 °ExitCare® Patient Information ©2014 [employer]. ° °

## 2013-08-05 NOTE — Progress Notes (Signed)
Clinical Social Work  SNF agreeable to accept patient after lunch. CSW coordinated transportation via [facility] per patient request. [facility] Request #: [phone]. [facility] Medicare ambulance authorization# [id number].  CSW is signing off but available if needed.  [place], [place] [phone]

## 2013-08-06 ENCOUNTER — Non-Acute Institutional Stay (SKILLED_NURSING_FACILITY): Payer: Medicare Other | Admitting: Internal Medicine

## 2013-08-06 DIAGNOSIS — I10 Essential (primary) hypertension: Secondary | ICD-10-CM

## 2013-08-06 DIAGNOSIS — N321 Vesicointestinal fistula: Secondary | ICD-10-CM

## 2013-08-06 DIAGNOSIS — E039 Hypothyroidism, unspecified: Secondary | ICD-10-CM

## 2013-08-06 DIAGNOSIS — R569 Unspecified convulsions: Secondary | ICD-10-CM

## 2013-08-07 ENCOUNTER — Other Ambulatory Visit (HOSPITAL_COMMUNITY): Payer: Self-pay | Admitting: Urology

## 2013-08-07 ENCOUNTER — Encounter: Payer: Self-pay | Admitting: *Deleted

## 2013-08-07 DIAGNOSIS — K632 Fistula of intestine: Secondary | ICD-10-CM

## 2013-08-08 NOTE — Progress Notes (Signed)
HISTORY & PHYSICAL  DATE: 08/06/2013   FACILITY: [facility]  LEVEL OF CARE: SNF (31)  ALLERGIES:  Allergies  Allergen Reactions  . Sulfonamide Derivatives Anaphylaxis    caused heart to stop as child  . Erythromycin     Interacts with seizure medication  . Latex Rash    CHIEF COMPLAINT:  Manage colovesical fistula, hypothyroidism and seizure disorder  HISTORY OF PRESENT ILLNESS: patient is a 73 year old Caucasian female who was hospitalized secondary to a colovesical fistula. She is status post take down of fistula with end colostomy and Hartmann pouch. After hospitalization she is admitted to this facility for short-term rehabilitation.  HYPOTHYROIDISM: The hypothyroidism remains stable. No complications noted from the medications presently being used.  The patient denies fatigue or constipation.  Last TSH not available.  SEIZURE DISORDER: The patient's seizure disorder remains stable. No complications reported from the medications presently being used. Staff do not report any recent seizure activity.   PAST MEDICAL HISTORY :  Past Medical History  Diagnosis Date  . GOITER, MULTINODULAR 05/04/2009  . HYPOTHYROIDISM 04/21/2009  . HYPERTENSION 04/21/2009  . ALLERGIC RHINITIS 04/21/2009  . SYNCOPE 01/02/2010  . FATIGUE 04/21/2009  . THYROID CANCER, HX OF 04/21/2009  . Osteoporosis 10/28/2011  . Environmental allergies   . Degenerative arthritis of right knee 09/24/2012  . Colonic fistula   . Cancer 1984    thyroid  . SEIZURE DISORDER     "under control"  . Headache(784.0)     hx of  . H/O: whooping cough 73 years old  . GERD (gastroesophageal reflux disease)     "tums controls it"  . Back spasm   . Personal history of arthritis   . Other malignant neoplasm without specification of site   . Menopausal syndrome     PAST SURGICAL HISTORY: Past Surgical History  Procedure Laterality Date  . S/p left radius fracture surgury  2001    Dr.  Rhona Raider, Ortho.  . S/p thyroid surgury for cancer  1985    biopsy  . Hx of right retenal detachment   Sept. 2005    partial  . S/p right cataract with lens implant    . Laparoscopic sigmoid colectomy  01/30/2012    Procedure: LAPAROSCOPIC SIGMOID COLECTOMY;  Surgeon: Pedro Earls, MD;  Location: [facility];  Service: General;  Laterality: N/A;  Lap Assisted Sigmoid Colectomy with Takedown of Colovesicle Fistula  . Take down of intestinal fistula  01/30/2012    Procedure: TAKE DOWN OF INTESTINAL FISTULA;  Surgeon: Pedro Earls, MD;  Location: [facility];  Service: General;  Laterality: N/A;  . Vesico-vaginal fistula repair  01/30/2012    Procedure: FISTULA REPAIR VESICO-VAGINAL;  Surgeon: Reece Packer, MD;  Location: [facility];  Service: Urology;  Laterality: N/A;  Enterovesical Fistula Repair  . Colon surgery    . Total knee arthroplasty Right 02/10/2013    Procedure: TOTAL KNEE ARTHROPLASTY;  Surgeon: Hessie Dibble, MD;  Location: [facility];  Service: Orthopedics;  Laterality: Right;  . Fracture surgery      left arm  . Colon resection N/A 07/28/2013    Procedure: LAPAROSCOPIC  SPLENIC FLEXURE mobilizationTAKEDOWN inner enterCOLOVESICLE FISTULAr  , lysis of adhesions.hartman procedure, partial salpingoectomey,   cystoscopy ;  Surgeon: Pedro Earls, MD;  Location: [facility];  Service: General;  Laterality: N/A;  . Vesico-vaginal fistula repair Left 07/28/2013    Procedure: cystoscopy;  Surgeon: Reece Packer,  MD;  Location: [facility];  Service: Urology;  Laterality: Left;    SOCIAL HISTORY:  reports that she quit smoking about 31 years ago. Her smoking use included Cigarettes. She smoked 0.00 packs per day for 10 years. She has never used smokeless tobacco. She reports that she does not drink alcohol or use illicit drugs.  FAMILY HISTORY:  Family History  Problem Relation Age of Onset  . Heart disease Mother   . Goiter Mother     mother had i-131 rx for a benign goiter  . Heart  disease Father   . Stroke Father   . Bipolar disorder Sister   . Heart attack Brother   . Bipolar disorder Brother   . Seizures Other     Strong family history of seizures, 2 brothers and 2 sisters    CURRENT MEDICATIONS: Reviewed per MAR/see medication list  REVIEW OF SYSTEMS:  See HPI otherwise 14 point ROS is negative.  PHYSICAL EXAMINATION  VS:  See VS section  GENERAL: no acute distress, normal body habitus EYES: conjunctivae normal, sclerae normal, normal eye lids MOUTH/THROAT: lips without lesions,no lesions in the mouth,tongue is without lesions,uvula elevates in midline NECK: supple, trachea midline, no neck masses, no thyroid tenderness, no thyromegaly LYMPHATICS: no LAN in the neck, no supraclavicular LAN RESPIRATORY: breathing is even & unlabored, BS CTAB CARDIAC: RRR, no murmur,no extra heart sounds, +1 bilateral lower extremity edema GI:  ABDOMEN: abdomen soft, normal BS, no masses, no tenderness , left lower quadrant colostomy LIVER/SPLEEN: no hepatomegaly, no splenomegaly MUSCULOSKELETAL: HEAD: normal to inspection & palpation BACK: no kyphosis, scoliosis or spinal processes tenderness EXTREMITIES: LEFT UPPER EXTREMITY: full range of motion, normal strength & tone RIGHT UPPER EXTREMITY:  full range of motion, normal strength & tone LEFT LOWER EXTREMITY:  Moderate range of motion, normal strength & tone RIGHT LOWER EXTREMITY: Moderate range of motion, normal strength & tone PSYCHIATRIC: the patient is alert & oriented to person, affect & behavior appropriate  LABS/RADIOLOGY:  Labs reviewed: Basic Metabolic Panel:  Recent Labs  08/03/13 0506 08/04/13 0428 08/05/13 0430  NA 125* 125* 122*  K 4.2 4.2 4.9  CL 92* 93* 88*  CO2 25 24 27   GLUCOSE 112* 104* 100*  BUN 8 11 14   CREATININE 0.53 0.47* 0.59  CALCIUM 8.1* 8.3* 8.4   Liver Function Tests:  Recent Labs  09/05/12 0731  AST 20  ALT 20  ALKPHOS 105  BILITOT 0.7  PROT 6.8  ALBUMIN 4.2    CBC:  Recent Labs  09/05/12 0731 02/02/13 0921  08/03/13 0506 08/04/13 0428 08/05/13 0430  WBC 4.8 5.7  < > 9.0 7.4 6.5  NEUTROABS 3.5 3.8  --   --   --   --   HGB 13.9 14.1  < > 8.4* 8.7* 8.9*  HCT 40.7 39.3  < > 24.0* 24.3* 25.6*  MCV 97.6 94.7  < > 93.0 93.1 93.8  PLT 284.0 261  < > 188 210 218  < > = values in this interval not displayed.  Lipid Panel:  Recent Labs  09/05/12 0731  HDL 85.90    SINGLE COLUMN BARIUM ENEMA   TECHNIQUE: Initial scout AP supine abdominal image obtained to insure adequate colon cleansing. Barium was introduced into the colon in a retrograde fashion and refluxed from the rectum to the cecum. Spot images of the colon followed by overhead radiographs were obtained.   FLUOROSCOPY TIME:  2 min 42 seconds   COMPARISON:  CT 01/07/2012.  FINDINGS: Single column barium enema was performed. This shows leakage of contrast from the sigmoid colon into the left pelvis and and into an irregularly-shaped space. This than communicates with the left side of the urinary bladder demonstrated by a thin wisp of contrast.   Sigmoid diverticulosis. Scattered diverticula near the splenic flexure. No fixed annular lesions. Mucosal irregularity in filling defects in the right colon felt to be related to retained stool/secretions.   IMPRESSION: Fistula between the sigmoid colon into and irregular extraluminal collection in the left pelvis which then passes to the left side of the bladder. Contrast passes into the bladder.    ASSESSMENT/PLAN:  Colovesicular fistula-status post colostomy. Continue rehabilitation. Hypothyroidism-continue levothyroxine. Seizure disorder-well controlled Hypertension-well controlled Check CBC and BMP  I have reviewed patient's medical records received at admission/from hospitalization.  CPT CODE: 12197  Gayani Y Dasanayaka, [place] [phone]

## 2013-08-11 ENCOUNTER — Ambulatory Visit (HOSPITAL_COMMUNITY)
Admission: RE | Admit: 2013-08-11 | Discharge: 2013-08-11 | Disposition: A | Discharge status: Home / Self Care | Payer: Medicare Other | Source: Ambulatory Visit | Attending: Urology | Admitting: Urology

## 2013-08-11 DIAGNOSIS — K632 Fistula of intestine: Secondary | ICD-10-CM

## 2013-08-11 DIAGNOSIS — N321 Vesicointestinal fistula: Secondary | ICD-10-CM | POA: Insufficient documentation

## 2013-08-11 DIAGNOSIS — M4120 Other idiopathic scoliosis, site unspecified: Secondary | ICD-10-CM | POA: Insufficient documentation

## 2013-08-11 MED ORDER — DIATRIZOATE MEGLUMINE 30 % UR SOLN
Freq: Once | URETHRAL | Status: AC | PRN
  Administered 2013-08-11: 200 mL

## 2013-08-19 ENCOUNTER — Encounter: Payer: Self-pay | Admitting: Adult Health

## 2013-08-19 ENCOUNTER — Non-Acute Institutional Stay (SKILLED_NURSING_FACILITY): Payer: Medicare Other | Admitting: Adult Health

## 2013-08-19 DIAGNOSIS — N321 Vesicointestinal fistula: Secondary | ICD-10-CM

## 2013-08-19 DIAGNOSIS — I10 Essential (primary) hypertension: Secondary | ICD-10-CM

## 2013-08-19 DIAGNOSIS — E039 Hypothyroidism, unspecified: Secondary | ICD-10-CM

## 2013-08-19 DIAGNOSIS — R569 Unspecified convulsions: Secondary | ICD-10-CM

## 2013-08-19 NOTE — Progress Notes (Signed)
Patient ID: Tammy Duke, female   DOB: [birth year], 73 y.o.   MRN: [id number]              PROGRESS NOTE  DATE: 08/19/2013   FACILITY: [facility]  LEVEL OF CARE: SNF (31)  Acute Visit  CHIEF COMPLAINT:  Discharge Notes  HISTORY OF PRESENT ILLNESS: This is a 73 year old female who is for discharge home with Home health PT and Nursing. She has been admitted to [facility] on 08/05/13 from [facility] with Colovesical fistula S/P Takedown of fistula with end colostomy and Hartmann pouch. Patient was admitted to this facility for short-term rehabilitation after the patient's recent hospitalization.  Patient has completed SNF rehabilitation and therapy has cleared the patient for discharge.  Reassessment of ongoing problem(s):  SEIZURE DISORDER: The patient's seizure disorder remains stable. No complications reported from the medications presently being used. Staff do not report any recent seizure activity.  HTN: Pt 's HTN remains stable.  Denies CP, sob, DOE, pedal edema, headaches, dizziness or visual disturbances.  No complications from the medications currently being used.  Last BP : 116/85  HYPOTHYROIDISM: The hypothyroidism remains stable. No complications noted from the medications presently being used.  The patient denies fatigue or constipation.  6/14 TSH 0.92   PAST MEDICAL HISTORY : Reviewed.  No changes/see problem list  CURRENT MEDICATIONS: Reviewed per MAR/see medication list  REVIEW OF SYSTEMS:  GENERAL: no change in appetite, no fatigue, no weight changes, no fever, chills or weakness RESPIRATORY: no cough, SOB, DOE, wheezing, hemoptysis CARDIAC: no chest pain, edema or palpitations GI: no abdominal pain, diarrhea, constipation, heart burn, nausea or vomiting  PHYSICAL EXAMINATION  GENERAL: no acute distress, normal body habitus EYES: conjunctivae normal, sclerae normal, normal eye lids NECK: supple, trachea midline, no neck masses,  no thyroid tenderness, no thyromegaly LYMPHATICS: no LAN in the neck, no supraclavicular LAN RESPIRATORY: breathing is even & unlabored, BS CTAB CARDIAC: RRR, no murmur,no extra heart sounds, no edema GI: abdomen soft, normal BS, no masses, no tenderness, no hepatomegaly, no splenomegaly, LLQ colostomy EXTREMITIES:  Able to move all 4 extremities; ambulates with walker PSYCHIATRIC: the patient is alert & oriented to person, affect & behavior appropriate  LABS/RADIOLOGY: Labs reviewed: Basic Metabolic Panel:  Recent Labs  08/03/13 0506 08/04/13 0428 08/05/13 0430  NA 125* 125* 122*  K 4.2 4.2 4.9  CL 92* 93* 88*  CO2 25 24 27   GLUCOSE 112* 104* 100*  BUN 8 11 14   CREATININE 0.53 0.47* 0.59  CALCIUM 8.1* 8.3* 8.4   Liver Function Tests:  Recent Labs  09/05/12 0731  AST 20  ALT 20  ALKPHOS 105  BILITOT 0.7  PROT 6.8  ALBUMIN 4.2   CBC:  Recent Labs  09/05/12 0731 02/02/13 0921  08/03/13 0506 08/04/13 0428 08/05/13 0430  WBC 4.8 5.7  < > 9.0 7.4 6.5  NEUTROABS 3.5 3.8  --   --   --   --   HGB 13.9 14.1  < > 8.4* 8.7* 8.9*  HCT 40.7 39.3  < > 24.0* 24.3* 25.6*  MCV 97.6 94.7  < > 93.0 93.1 93.8  PLT 284.0 261  < > 188 210 218  < > = values in this interval not displayed.  Lipid Panel:  Recent Labs  09/05/12 0731  HDL 85.90    ASSESSMENT/PLAN:  Colovesical fistula S/P Takedown of fistula with end colostomy and Hartmann pouch - for Home health PT and Nursing  Seizure - stable; continue Tegretol Hypothyroidism - continue Synthroid Hypertension - well-controlled; continue Cozaar    I have filled out patient's discharge paperwork and written prescriptions.  Patient will receive home health PT and OT.  Total discharge time: Less than 30 minutes Discharge time involved coordination of the discharge process with Education officer, museum, nursing staff and therapy department. Medical justification for home health services verified.  CPT CODE: 75916  Seth Bake  - NP [facility] [phone]

## 2013-08-28 DIAGNOSIS — Z48816 Encounter for surgical aftercare following surgery on the genitourinary system: Secondary | ICD-10-CM

## 2013-08-28 DIAGNOSIS — Z433 Encounter for attention to colostomy: Secondary | ICD-10-CM

## 2013-08-28 DIAGNOSIS — I10 Essential (primary) hypertension: Secondary | ICD-10-CM

## 2013-08-28 DIAGNOSIS — G40909 Epilepsy, unspecified, not intractable, without status epilepticus: Secondary | ICD-10-CM

## 2013-09-02 ENCOUNTER — Encounter (INDEPENDENT_AMBULATORY_CARE_PROVIDER_SITE_OTHER): Payer: Self-pay | Admitting: Surgery

## 2013-09-02 ENCOUNTER — Ambulatory Visit (INDEPENDENT_AMBULATORY_CARE_PROVIDER_SITE_OTHER): Payer: Medicare Other | Admitting: Surgery

## 2013-09-02 VITALS — BP 120/80 | HR 71 | Temp 98.4°F | Resp 16 | Ht 71.0 in | Wt 144.6 lb

## 2013-09-02 DIAGNOSIS — Z9889 Other specified postprocedural states: Secondary | ICD-10-CM

## 2013-09-02 DIAGNOSIS — Z9049 Acquired absence of other specified parts of digestive tract: Secondary | ICD-10-CM

## 2013-09-02 NOTE — Progress Notes (Signed)
Tammy Duke 73 y.o.  Body mass index is 20.18 kg/(m^2).  Patient Active Problem List   Diagnosis Date Noted  . S/P colostomy 08/05/2013  . 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 Degenerative arthritis of right knee 09/24/2012  . Urinary tract infection, site not specified 01/21/2012  . Osteoporosis 10/28/2011  . Menopausal syndrome 09/06/2011  . Preventative health care 08/13/2010  . SYNCOPE 01/02/2010  . GOITER, MULTINODULAR 05/04/2009  . HYPOTHYROIDISM 04/21/2009  . HYPERLIPIDEMIA 04/21/2009  . HYPERTENSION 04/21/2009  . ALLERGIC RHINITIS 04/21/2009  . SEIZURE DISORDER 04/21/2009    Allergies  Allergen Reactions  . Sulfonamide Derivatives Anaphylaxis    caused heart to stop as child  . Erythromycin     Interacts with seizure medication  . Latex Rash    Past Surgical History  Procedure Laterality Date  . S/p left radius fracture surgury  2001    Dr. Rhona Raider, Ortho.  . S/p thyroid surgury for cancer  1985    biopsy  . Hx of right retenal detachment   Sept. 2005    partial  . S/p right cataract with lens implant    . Laparoscopic sigmoid colectomy  01/30/2012    Procedure: LAPAROSCOPIC SIGMOID COLECTOMY;  Surgeon: Pedro Earls, MD;  Location: [facility];  Service: General;  Laterality: N/A;  Lap Assisted Sigmoid Colectomy with Takedown of Colovesicle Fistula  . Take down of intestinal fistula  01/30/2012    Procedure: TAKE DOWN OF INTESTINAL FISTULA;  Surgeon: Pedro Earls, MD;  Location: [facility];  Service: General;  Laterality: N/A;  . Vesico-vaginal fistula repair  01/30/2012    Procedure: FISTULA REPAIR VESICO-VAGINAL;  Surgeon: Reece Packer, MD;  Location: [facility];  Service: Urology;  Laterality: N/A;  Enterovesical Fistula Repair  . Colon surgery    . Total knee arthroplasty Right 02/10/2013    Procedure: TOTAL KNEE ARTHROPLASTY;  Surgeon: Hessie Dibble, MD;  Location: [facility];  Service: Orthopedics;   Laterality: Right;  . Fracture surgery      left arm  . Colon resection N/A 07/28/2013    Procedure: LAPAROSCOPIC  SPLENIC FLEXURE mobilizationTAKEDOWN inner enterCOLOVESICLE FISTULAr  , lysis of adhesions.hartman procedure, partial salpingoectomey,   cystoscopy ;  Surgeon: Pedro Earls, MD;  Location: [facility];  Service: General;  Laterality: N/A;  . Vesico-vaginal fistula repair Left 07/28/2013    Procedure: cystoscopy;  Surgeon: Reece Packer, MD;  [facility];  Service: Urology;  Laterality: Left;   Cathlean Cower, MD 1. S/P colectomy     Incision has healed OK.  Ostomy functioning well.  Path given to her. Will get BE from ostomy and below to make sure there is no leak.   Plan takedown of colostomy in August.  Hopefully lap assisted takedown of ostomy.  Matt B. Hassell Done, MD, [facility] [phone] beeper [phone]  09/02/2013 10:15 AM

## 2013-09-02 NOTE — Patient Instructions (Signed)
Will obtain a contrast enema from below before takedown of your colostomy Target date for ostomy takedown is August.

## 2013-09-03 ENCOUNTER — Other Ambulatory Visit (INDEPENDENT_AMBULATORY_CARE_PROVIDER_SITE_OTHER): Payer: Self-pay

## 2013-09-03 DIAGNOSIS — Z9889 Other specified postprocedural states: Secondary | ICD-10-CM

## 2013-09-04 ENCOUNTER — Telehealth (INDEPENDENT_AMBULATORY_CARE_PROVIDER_SITE_OTHER): Payer: Self-pay | Admitting: *Deleted

## 2013-09-04 NOTE — Telephone Encounter (Signed)
Informed pt of message below. Pt verbalized understanding. Pt states that she will come by Monday and pick up the citrate.

## 2013-09-04 NOTE — Telephone Encounter (Signed)
LM for pt to return my call.  Dr. Hassell Done wants her to have a BE.  I have that schedule at [facility] for 09-08-13.  Advise pt to arrive at 10:45 for a 11:00 appt.  I'm not sure if pt has Citrate or not, if not, pt will need to pick up one bottle at the office and drink it the day before surgery.  NPO after midnight.  Thanks!  Anderson Malta

## 2013-09-08 ENCOUNTER — Other Ambulatory Visit (INDEPENDENT_AMBULATORY_CARE_PROVIDER_SITE_OTHER): Payer: Self-pay | Admitting: Surgery

## 2013-09-08 ENCOUNTER — Ambulatory Visit (HOSPITAL_COMMUNITY)
Admission: RE | Admit: 2013-09-08 | Discharge: 2013-09-08 | Disposition: A | Discharge status: Home / Self Care | Payer: Medicare Other | Source: Ambulatory Visit | Attending: Surgery | Admitting: Surgery

## 2013-09-08 DIAGNOSIS — Z9889 Other specified postprocedural states: Secondary | ICD-10-CM

## 2013-09-08 DIAGNOSIS — Z933 Colostomy status: Secondary | ICD-10-CM | POA: Insufficient documentation

## 2013-09-08 DIAGNOSIS — Z01818 Encounter for other preprocedural examination: Secondary | ICD-10-CM | POA: Insufficient documentation

## 2013-09-08 MED ORDER — IOHEXOL 300 MG/ML  SOLN
300.0000 mL | Freq: Once | INTRAMUSCULAR | Status: AC | PRN
  Administered 2013-09-08: 300 mL

## 2013-09-09 LAB — HM MAMMOGRAPHY

## 2013-09-14 ENCOUNTER — Ambulatory Visit (INDEPENDENT_AMBULATORY_CARE_PROVIDER_SITE_OTHER): Payer: Medicare Other | Admitting: Ophthalmology

## 2013-09-14 DIAGNOSIS — I10 Essential (primary) hypertension: Secondary | ICD-10-CM

## 2013-09-14 DIAGNOSIS — H2510 Age-related nuclear cataract, unspecified eye: Secondary | ICD-10-CM

## 2013-09-14 DIAGNOSIS — H348392 Tributary (branch) retinal vein occlusion, unspecified eye, stable: Secondary | ICD-10-CM

## 2013-09-14 DIAGNOSIS — H33309 Unspecified retinal break, unspecified eye: Secondary | ICD-10-CM

## 2013-09-14 DIAGNOSIS — H43819 Vitreous degeneration, unspecified eye: Secondary | ICD-10-CM

## 2013-09-14 DIAGNOSIS — H35039 Hypertensive retinopathy, unspecified eye: Secondary | ICD-10-CM

## 2013-09-15 ENCOUNTER — Encounter: Payer: Self-pay | Admitting: Internal Medicine

## 2013-09-16 ENCOUNTER — Other Ambulatory Visit (INDEPENDENT_AMBULATORY_CARE_PROVIDER_SITE_OTHER): Payer: Self-pay | Admitting: Surgery

## 2013-09-16 ENCOUNTER — Ambulatory Visit (HOSPITAL_COMMUNITY)
Admission: RE | Admit: 2013-09-16 | Discharge: 2013-09-16 | Disposition: A | Discharge status: Home / Self Care | Payer: Medicare Other | Source: Ambulatory Visit | Attending: Surgery | Admitting: Surgery

## 2013-09-16 DIAGNOSIS — Z9889 Other specified postprocedural states: Secondary | ICD-10-CM

## 2013-09-16 DIAGNOSIS — Z9049 Acquired absence of other specified parts of digestive tract: Secondary | ICD-10-CM | POA: Insufficient documentation

## 2013-09-16 DIAGNOSIS — K5730 Diverticulosis of large intestine without perforation or abscess without bleeding: Secondary | ICD-10-CM | POA: Insufficient documentation

## 2013-09-21 ENCOUNTER — Telehealth (INDEPENDENT_AMBULATORY_CARE_PROVIDER_SITE_OTHER): Payer: Self-pay

## 2013-09-21 NOTE — Telephone Encounter (Signed)
Message copied by Ivor Costa on Mon Sep 21, 2013  3:48 PM ------      Message from: Salvatore Marvel      Created: Tue Sep 15, 2013  2:47 PM      Regarding: Dr. Hassell Done Question Appt?      Contact: [phone]       Patient called and wants to know if she needs to schedule and appt to see Dr. Hassell Done before the colostomy in August, please call her.            Thank you. ------

## 2013-09-21 NOTE — Telephone Encounter (Signed)
Called and left message for patient.  RE:  Patient will need a pre-op appointment about a week prior to her surgery.  Patient not scheduled at this time.  Once patient has a surgery date then we will schedule patient a pre-op appointment.

## 2013-09-22 ENCOUNTER — Ambulatory Visit (INDEPENDENT_AMBULATORY_CARE_PROVIDER_SITE_OTHER): Payer: Medicare Other | Admitting: Internal Medicine

## 2013-09-22 ENCOUNTER — Other Ambulatory Visit (INDEPENDENT_AMBULATORY_CARE_PROVIDER_SITE_OTHER): Payer: Medicare Other

## 2013-09-22 ENCOUNTER — Encounter: Payer: Self-pay | Admitting: Internal Medicine

## 2013-09-22 ENCOUNTER — Other Ambulatory Visit (HOSPITAL_COMMUNITY): Payer: Medicare Other

## 2013-09-22 VITALS — BP 122/70 | HR 98 | Temp 98.4°F | Wt 145.0 lb

## 2013-09-22 DIAGNOSIS — E039 Hypothyroidism, unspecified: Secondary | ICD-10-CM

## 2013-09-22 DIAGNOSIS — E785 Hyperlipidemia, unspecified: Secondary | ICD-10-CM

## 2013-09-22 DIAGNOSIS — M5489 Other dorsalgia: Secondary | ICD-10-CM

## 2013-09-22 DIAGNOSIS — M549 Dorsalgia, unspecified: Secondary | ICD-10-CM

## 2013-09-22 DIAGNOSIS — Z0000 Encounter for general adult medical examination without abnormal findings: Secondary | ICD-10-CM

## 2013-09-22 LAB — BASIC METABOLIC PANEL
BUN: 24 mg/dL — AB (ref 6–23)
CO2: 26 mEq/L (ref 19–32)
CREATININE: 0.7 mg/dL (ref 0.4–1.2)
Calcium: 9 mg/dL (ref 8.4–10.5)
Chloride: 97 mEq/L (ref 96–112)
GFR: 89 mL/min (ref >60.00)
Glucose, Bld: 99 mg/dL (ref 70–99)
Potassium: 4 mEq/L (ref 3.5–5.1)
Sodium: 132 mEq/L — ABNORMAL LOW (ref 135–145)

## 2013-09-22 LAB — TSH: TSH: 1.14 u[IU]/mL (ref 0.35–4.50)

## 2013-09-22 LAB — CBC WITH DIFFERENTIAL/PLATELET
BASOS PCT: 0.4 % (ref 0.0–3.0)
Basophils Absolute: 0 10*3/uL (ref 0.0–0.1)
Eosinophils Absolute: 0.1 10*3/uL (ref 0.0–0.7)
Eosinophils Relative: 0.9 % (ref 0.0–5.0)
HCT: 38.5 % (ref 36.0–46.0)
HEMOGLOBIN: 12.8 g/dL (ref 12.0–15.0)
LYMPHS PCT: 17 % (ref 12.0–46.0)
Lymphs Abs: 1 10*3/uL (ref 0.7–4.0)
MCHC: 33.2 g/dL (ref 30.0–36.0)
MCV: 93.9 fl (ref 78.0–100.0)
MONO ABS: 0.4 10*3/uL (ref 0.1–1.0)
Monocytes Relative: 5.9 % (ref 3.0–12.0)
NEUTROS PCT: 75.8 % (ref 43.0–77.0)
Neutro Abs: 4.6 10*3/uL (ref 1.4–7.7)
Platelets: 288 10*3/uL (ref 150.0–400.0)
RBC: 4.1 Mil/uL (ref 3.87–5.11)
RDW: 13.4 % (ref 11.5–15.5)
WBC: 6 10*3/uL (ref 4.0–10.5)

## 2013-09-22 LAB — URINALYSIS, ROUTINE W REFLEX MICROSCOPIC
Bilirubin Urine: NEGATIVE
Ketones, ur: NEGATIVE
NITRITE: NEGATIVE
Specific Gravity, Urine: 1.02 (ref 1.000–1.030)
Urine Glucose: NEGATIVE
Urobilinogen, UA: 0.2 (ref 0.0–1.0)
pH: 6 (ref 5.0–8.0)

## 2013-09-22 LAB — HEPATIC FUNCTION PANEL
ALT: 15 U/L (ref 0–35)
AST: 20 U/L (ref 0–37)
Albumin: 4.1 g/dL (ref 3.5–5.2)
Alkaline Phosphatase: 100 U/L (ref 39–117)
Bilirubin, Direct: 0.1 mg/dL (ref 0.0–0.3)
Total Bilirubin: 0.6 mg/dL (ref 0.2–1.2)
Total Protein: 6.6 g/dL (ref 6.0–8.3)

## 2013-09-22 LAB — LIPID PANEL
Cholesterol: 205 mg/dL — ABNORMAL HIGH (ref 0–200)
HDL: 97.9 mg/dL (ref >39.00)
LDL CALC: 83 mg/dL (ref 0–99)
NonHDL: 107.1
TRIGLYCERIDES: 121 mg/dL (ref 0.0–149.0)
Total CHOL/HDL Ratio: 2
VLDL: 24.2 mg/dL (ref 0.0–40.0)

## 2013-09-22 NOTE — Progress Notes (Signed)
Pre visit review using our clinic review tool, if applicable. No additional management support is needed unless otherwise documented below in the visit note. 

## 2013-09-22 NOTE — Patient Instructions (Addendum)
Please continue all other medications as before, and refills have been done if requested.  Please have the pharmacy call with any other refills you may need.  Please continue your efforts at being more active, low cholesterol diet, and weight control.  You are otherwise up to date with prevention measures today.  Please keep your appointments with your specialists as you may have planned - Dr Nyoka Lint will be contacted regarding the referral for: neurosurgury to consider the kyphoplasty  Please go to the LAB in the Basement (turn left off the elevator) for the tests to be done today  You will be contacted by phone if any changes need to be made immediately.  Otherwise, you will receive a letter about your results with an explanation, but please check with MyChart first.  Please remember to sign up for MyChart if you have not done so, as this will be important to you in the future with finding out test results, communicating by private email, and scheduling acute appointments online when needed.  Please return in 6 months, or sooner if needed

## 2013-09-22 NOTE — Assessment & Plan Note (Signed)
For NS referral, consider kyphoplasty

## 2013-09-22 NOTE — Assessment & Plan Note (Signed)

## 2013-09-22 NOTE — Progress Notes (Signed)
Subjective:    Patient ID: Tammy Duke, female    DOB: 01/18/1941, 73 y.o.   MRN: [id number]  HPI  Here for wellness and f/u;  Overall doing ok;  Pt denies CP, worsening SOB, DOE, wheezing, orthopnea, PND, worsening LE edema, palpitations, dizziness or syncope.  Pt denies neurological change such as new headache, facial or extremity weakness.  Pt denies polydipsia, polyuria, or low sugar symptoms. Pt states overall good compliance with treatment and medications, good tolerability, and has been trying to follow lower cholesterol diet.  Pt denies worsening depressive symptoms, suicidal ideation or panic. No fever, night sweats, wt loss, loss of appetite, or other constitutional symptoms.  Pt states good ability with ADL's, has low fall risk, home safety reviewed and adequate, no other significant changes in hearing or vision, and only occasionally active with exercise.  Pt continues to have recurring LBP without change in severity, bowel or bladder change, fever, wt loss,  worsening LE pain/numbness/weakness, gait change or falls - asks for NS referral for kyphoplasty. Past Medical History  Diagnosis Date  . GOITER, MULTINODULAR 05/04/2009  . HYPOTHYROIDISM 04/21/2009  . HYPERTENSION 04/21/2009  . ALLERGIC RHINITIS 04/21/2009  . SYNCOPE 01/02/2010  . FATIGUE 04/21/2009  . THYROID CANCER, HX OF 04/21/2009  . Osteoporosis 10/28/2011  . Environmental allergies   . Degenerative arthritis of right knee 09/24/2012  . Colonic fistula   . Cancer 1984    thyroid  . SEIZURE DISORDER     "under control"  . Headache(784.0)     hx of  . H/O: whooping cough 73 years old  . GERD (gastroesophageal reflux disease)     "tums controls it"  . Back spasm   . Personal history of arthritis   . Other malignant neoplasm without specification of site   . Menopausal syndrome    Past Surgical History  Procedure Laterality Date  . S/p left radius fracture surgury  2001    Dr. Rhona Raider, Ortho.  . S/p thyroid  surgury for cancer  1985    biopsy  . Hx of right retenal detachment   Sept. 2005    partial  . S/p right cataract with lens implant    . Laparoscopic sigmoid colectomy  01/30/2012    Procedure: LAPAROSCOPIC SIGMOID COLECTOMY;  Surgeon: Pedro Earls, MD;  [facility];  Service: General;  Laterality: N/A;  Lap Assisted Sigmoid Colectomy with Takedown of Colovesicle Fistula  . Take down of intestinal fistula  01/30/2012    Procedure: TAKE DOWN OF INTESTINAL FISTULA;  Surgeon: Pedro Earls, MD;  [facility];  Service: General;  Laterality: N/A;  . Vesico-vaginal fistula repair  01/30/2012    Procedure: FISTULA REPAIR VESICO-VAGINAL;  Surgeon: Reece Packer, MD;  [facility];  Service: Urology;  Laterality: N/A;  Enterovesical Fistula Repair  . Colon surgery    . Total knee arthroplasty Right 02/10/2013    Procedure: TOTAL KNEE ARTHROPLASTY;  Surgeon: Hessie Dibble, MD;  Location: [facility];  Service: Orthopedics;  Laterality: Right;  . Fracture surgery      left arm  . Colon resection N/A 07/28/2013    Procedure: LAPAROSCOPIC  SPLENIC FLEXURE mobilizationTAKEDOWN inner enterCOLOVESICLE FISTULAr  , lysis of adhesions.hartman procedure, partial salpingoectomey,   cystoscopy ;  Surgeon: Pedro Earls, MD;  [facility];  Service: General;  Laterality: N/A;  . Vesico-vaginal fistula repair Left 07/28/2013    Procedure: cystoscopy;  Surgeon: Reece Packer, MD;  Location: [facility];  Service: Urology;  Laterality: Left;    reports that she quit smoking about 31 years ago. Her smoking use included Cigarettes. She smoked 0.00 packs per day for 10 years. She has never used smokeless tobacco. She reports that she does not drink alcohol or use illicit drugs. family history includes Bipolar disorder in her brother and sister; Goiter in her mother; Heart attack in her brother; Heart disease in her father and mother; Seizures in her other; Stroke in her father. Allergies    Allergen Reactions  . Sulfonamide Derivatives Anaphylaxis    caused heart to stop as child  . Erythromycin     Interacts with seizure medication  . Latex Rash   Current Outpatient Prescriptions on File Prior to Visit  Medication Sig Dispense Refill  . aspirin EC 81 MG tablet Take 81 mg by mouth daily.      . Calcium Carbonate-Vitamin D (CALTRATE 600+D) 600-400 MG-UNIT per tablet Take 1 tablet by mouth 2 (two) times daily.        . carbamazepine (TEGRETOL) 200 MG tablet Take 200-300 mg by mouth 4 (four) times daily. Takes 1.5 tablet every morning and at bedtime and 1 tablet at lunch and dinner      . levothyroxine (SYNTHROID, LEVOTHROID) 100 MCG tablet Take 100 mcg by mouth daily before breakfast.      . losartan (COZAAR) 50 MG tablet Take 50 mg by mouth every morning.       . Magnesium 250 MG TABS Take 1 tablet by mouth daily.       . methocarbamol (ROBAXIN) 500 MG tablet Take 500 mg by mouth every 8 (eight) hours as needed.       . Multiple Vitamin (MULTIVITAMIN WITH MINERALS) TABS tablet Take 1 tablet by mouth daily.      Marland Kitchen oxyCODONE-acetaminophen (PERCOCET/ROXICET) 5-325 MG per tablet Take 1 tablet by mouth every 8 (eight) hours as needed for moderate pain.       Marland Kitchen tiZANidine (ZANAFLEX) 2 MG tablet Take 2 mg by mouth every 6 (six) hours as needed for muscle spasms.      . vitamin C (ASCORBIC ACID) 250 MG tablet Take 250 mg by mouth daily.       No current facility-administered medications on file prior to visit.   Review of Systems Constitutional: Negative for increased diaphoresis, other activity, appetite or other siginficant weight change  HENT: Negative for worsening hearing loss, ear pain, facial swelling, mouth sores and neck stiffness.   Eyes: Negative for other worsening pain, redness or visual disturbance.  Respiratory: Negative for shortness of breath and wheezing.   Cardiovascular: Negative for chest pain and palpitations.  Gastrointestinal: Negative for diarrhea, blood  in stool, abdominal distention or other pain Genitourinary: Negative for hematuria, flank pain or change in urine volume.  Musculoskeletal: Negative for myalgias or other joint complaints.  Skin: Negative for color change and wound.  Neurological: Negative for syncope and numbness. other than noted Hematological: Negative for adenopathy. or other swelling Psychiatric/Behavioral: Negative for hallucinations, self-injury, decreased concentration or other worsening agitation.      Objective:   Physical Exam BP 122/70  Pulse 98  Temp(Src) 98.4 F (36.9 C) (Oral)  Wt 145 lb (65.772 kg)  SpO2 98% VS noted, uses walker for outside or out of the home Constitutional: Pt is oriented to person, place, and time. Appears well-developed and well-nourished.  Head: Normocephalic and atraumatic.  Right Ear: External ear normal.  Left Ear: External ear normal.  Nose: Nose normal.  Mouth/Throat: Oropharynx is clear and moist.  Eyes: Conjunctivae and EOM are normal. Pupils are equal, round, and reactive to light.  Neck: Normal range of motion. Neck supple. No JVD present. No tracheal deviation present.  Cardiovascular: Normal rate, regular rhythm, normal heart sounds and intact distal pulses.   Pulmonary/Chest: Effort normal and breath sounds without rales or wheezing  Abdominal: Soft. Bowel sounds are normal. NT. No HSM  Musculoskeletal: Normal range of motion. Exhibits no edema.  Lymphadenopathy:  Has no cervical adenopathy.  Neurological: Pt is alert and oriented to person, place, and time. Pt has normal reflexes. No cranial nerve deficit. Motor grossly intact Skin: Skin is warm and dry. No rash noted.  Psychiatric:  Has normal mood and affect. Behavior is normal.     Assessment & Plan:

## 2013-09-23 ENCOUNTER — Encounter: Payer: Self-pay | Admitting: Internal Medicine

## 2013-10-01 ENCOUNTER — Telehealth: Payer: Self-pay

## 2013-10-01 NOTE — Telephone Encounter (Signed)
i believe there may have been outside studies; I dont see specific results of imaging on our EMR  Ok to contact pt to try to track down documentation of recent studies such as xray or MRI

## 2013-10-01 NOTE — Telephone Encounter (Signed)
Called the patient and she only mentioned this as a possibility at her last OV, but is having another surgery in August.  She would like referral to neurosurgeon to be canceled.  Stated she will let PCP know if and when she would like to proceed with this.  [facility] left detailed message with Freda Munro. At [phone]

## 2013-10-01 NOTE — Telephone Encounter (Signed)
Patient has been referred to [facility] for back pain.  [facility] referral coordinator called to inquire that in her chart was indicated possible for kyphoplasty.  They would need any studies done that would indicate the need.  Call back number [phone]

## 2013-10-01 NOTE — Telephone Encounter (Signed)
Ok to cancel NS referral - to [facility]

## 2013-10-06 ENCOUNTER — Other Ambulatory Visit (HOSPITAL_COMMUNITY): Payer: Medicare Other

## 2013-10-14 ENCOUNTER — Ambulatory Visit (INDEPENDENT_AMBULATORY_CARE_PROVIDER_SITE_OTHER): Payer: Medicare Other | Admitting: Endocrinology

## 2013-10-14 ENCOUNTER — Encounter: Payer: Self-pay | Admitting: Endocrinology

## 2013-10-14 ENCOUNTER — Telehealth: Payer: Self-pay | Admitting: Endocrinology

## 2013-10-14 VITALS — BP 126/90 | HR 87 | Temp 98.2°F | Ht 71.0 in | Wt 145.0 lb

## 2013-10-14 DIAGNOSIS — E042 Nontoxic multinodular goiter: Secondary | ICD-10-CM

## 2013-10-14 NOTE — Patient Instructions (Addendum)
Please continue the same levothyroxine Please return in 1 year.   Let's recheck the ultrasound. you will receive a phone call, about a day and time for an appointment.

## 2013-10-14 NOTE — Progress Notes (Signed)
Subjective:    Patient ID: Tammy Duke, female    DOB: 01/17/1941, 73 y.o.   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  ultrasounds since then have showed a small multinodular goiter, so she has been reassigned a dx of benign multinodular goiter.  She does not notice any nodule at the neck.  pt states she feels well in general.  She has chronic primary hypothyroidism, and has been on synthroid for many years.   Past Medical History  Diagnosis Date  . GOITER, MULTINODULAR 05/04/2009  . HYPOTHYROIDISM 04/21/2009  . HYPERTENSION 04/21/2009  . ALLERGIC RHINITIS 04/21/2009  . SYNCOPE 01/02/2010  . FATIGUE 04/21/2009  . THYROID CANCER, HX OF 04/21/2009  . Osteoporosis 10/28/2011  . Environmental allergies   . Degenerative arthritis of right knee 09/24/2012  . Colonic fistula   . Cancer 1984    thyroid  . SEIZURE DISORDER     "under control"  . Headache(784.0)     hx of  . H/O: whooping cough 73 years old  . GERD (gastroesophageal reflux disease)     "tums controls it"  . Back spasm   . Personal history of arthritis   . Other malignant neoplasm without specification of site   . Menopausal syndrome     Past Surgical History  Procedure Laterality Date  . S/p left radius fracture surgury  2001    Dr. Rhona Raider, Ortho.  . S/p thyroid surgury for cancer  1985    biopsy  . Hx of right retenal detachment   Sept. 2005    partial  . S/p right cataract with lens implant    . Laparoscopic sigmoid colectomy  01/30/2012    Procedure: LAPAROSCOPIC SIGMOID COLECTOMY;  Surgeon: Pedro Earls, MD;  [facility];  Service: General;  Laterality: N/A;  Lap Assisted Sigmoid Colectomy with Takedown of Colovesicle Fistula  . Take down of intestinal fistula  01/30/2012    Procedure: TAKE DOWN OF INTESTINAL FISTULA;  Surgeon: Pedro Earls, MD;  [facility];  Service: General;  Laterality: N/A;  .  Vesico-vaginal fistula repair  01/30/2012    Procedure: FISTULA REPAIR VESICO-VAGINAL;  Surgeon: Reece Packer, MD;  [facility];  Service: Urology;  Laterality: N/A;  Enterovesical Fistula Repair  . Colon surgery    . Total knee arthroplasty Right 02/10/2013    Procedure: TOTAL KNEE ARTHROPLASTY;  Surgeon: Hessie Dibble, MD;  Location: [facility];  Service: Orthopedics;  Laterality: Right;  . Fracture surgery      left arm  . Colon resection N/A 07/28/2013    Procedure: LAPAROSCOPIC  SPLENIC FLEXURE mobilizationTAKEDOWN inner enterCOLOVESICLE FISTULAr  , lysis of adhesions.hartman procedure, partial salpingoectomey,   cystoscopy ;  Surgeon: Pedro Earls, MD;  [facility];  Service: General;  Laterality: N/A;  . Vesico-vaginal fistula repair Left 07/28/2013    Procedure: cystoscopy;  Surgeon: Reece Packer, MD;  [facility];  Service: Urology;  Laterality: Left;    History   Social History  . Marital Status: Widowed    Spouse Name: N/A    Number of Children: N/A  . Years of Education: N/A   Occupational History  . retired [place] History Main Topics  . Smoking status: Former Smoker -- 10 years    Types: Cigarettes    Quit date: 04/02/1982  . Smokeless tobacco: Never Used  Comment: 1 pack would last 2 weeks  . Alcohol Use: No  . Drug Use: No  . Sexual Activity: No   Other Topics Concern  . Not on file   Social History Narrative   Lives alone in apt.    Current Outpatient Prescriptions on File Prior to Visit  Medication Sig Dispense Refill  . aspirin EC 81 MG tablet Take 81 mg by mouth daily.      . Calcium Carbonate-Vitamin D (CALTRATE 600+D) 600-400 MG-UNIT per tablet Take 1 tablet by mouth 2 (two) times daily.        . carbamazepine (TEGRETOL) 200 MG tablet Take 200-300 mg by mouth 4 (four) times daily. Takes 1.5 tablet every morning and at bedtime and 1 tablet at lunch and dinner      . levothyroxine (SYNTHROID, LEVOTHROID) 100  MCG tablet Take 100 mcg by mouth daily before breakfast.      . losartan (COZAAR) 50 MG tablet Take 50 mg by mouth every morning.       . Magnesium 250 MG TABS Take 1 tablet by mouth daily.       . methocarbamol (ROBAXIN) 500 MG tablet Take 500 mg by mouth every 8 (eight) hours as needed.       . Multiple Vitamin (MULTIVITAMIN WITH MINERALS) TABS tablet Take 1 tablet by mouth daily.      Marland Kitchen oxyCODONE-acetaminophen (PERCOCET/ROXICET) 5-325 MG per tablet Take 1 tablet by mouth every 8 (eight) hours as needed for moderate pain.       Marland Kitchen tiZANidine (ZANAFLEX) 2 MG tablet Take 2 mg by mouth every 6 (six) hours as needed for muscle spasms.      . vitamin C (ASCORBIC ACID) 250 MG tablet Take 250 mg by mouth daily.       No current facility-administered medications on file prior to visit.    Allergies  Allergen Reactions  . Sulfonamide Derivatives Anaphylaxis    caused heart to stop as child  . Erythromycin     Interacts with seizure medication  . Latex Rash    Family History  Problem Relation Age of Onset  . Heart disease Mother   . Goiter Mother     mother had i-131 rx for a benign goiter  . Heart disease Father   . Stroke Father   . Bipolar disorder Sister   . Heart attack Brother   . Bipolar disorder Brother   . Seizures Other     Strong family history of seizures, 2 brothers and 2 sisters    BP 126/90  Pulse 87  Temp(Src) 98.2 F (36.8 C) (Oral)  Ht 5\' 11"  (1.803 m)  Wt 145 lb (65.772 kg)  BMI 20.23 kg/m2  SpO2 96%  Review of Systems She has lost a few lbs (recent abd surgery)    Objective:   Physical Exam VITAL SIGNS:  See vs page GENERAL: no distress NECK: There is no palpable thyroid enlargement.  No thyroid nodule is palpable.  No palpable lymphadenopathy at the anterior neck.   Lab Results  Component Value Date   TSH 1.14 09/22/2013      Assessment & Plan:  Hypothyroidism: well-replaced Multinodular goiter: not clinically evident today--good.    Patient  is advised the following: Patient Instructions  Please continue the same levothyroxine Please return in 1 year.   Let's recheck the ultrasound. you will receive a phone call, about a day and time for an appointment.

## 2013-10-15 ENCOUNTER — Encounter (INDEPENDENT_AMBULATORY_CARE_PROVIDER_SITE_OTHER): Payer: Self-pay | Admitting: Surgery

## 2013-10-15 ENCOUNTER — Ambulatory Visit (INDEPENDENT_AMBULATORY_CARE_PROVIDER_SITE_OTHER): Payer: Medicare Other | Admitting: Surgery

## 2013-10-15 VITALS — BP 134/78 | HR 74 | Temp 97.6°F | Ht 71.0 in | Wt 145.0 lb

## 2013-10-15 DIAGNOSIS — Z933 Colostomy status: Secondary | ICD-10-CM

## 2013-10-15 MED ORDER — NEOMYCIN SULFATE 500 MG PO TABS: 1000.0000 mg | ORAL_TABLET | Freq: Three times a day (TID) | ORAL | Status: AC

## 2013-10-15 MED ORDER — METRONIDAZOLE 500 MG PO TABS: 500.0000 mg | ORAL_TABLET | Freq: Three times a day (TID) | ORAL | Status: AC

## 2013-10-15 NOTE — Patient Instructions (Signed)
You will need an oral bowel prep and also take a couple of low volume enemas through your rectum before surgery.

## 2013-10-15 NOTE — Addendum Note (Signed)
Addended by: Carlene Coria on: 10/15/2013 11:14 AM   Modules accepted: Orders

## 2013-10-15 NOTE — Progress Notes (Signed)
Chief Complaint:  Ostomy for takedown  History of Present Illness:  Tammy Duke is an 73 y.o. 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  She is currently awaiting takedown of her present ostomy in August which will be 3 months from her prior surgery.  I have discussed this with her and the risks of surgery and possilble need to go to [facility] after surgery-hopefully not.    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 This will likely require a low anterior anastomosis after further mobilization of her proximal colon.  Past Medical History  Diagnosis Date  . GOITER, MULTINODULAR 05/04/2009  . HYPOTHYROIDISM 04/21/2009  . HYPERTENSION 04/21/2009  . ALLERGIC RHINITIS 04/21/2009  . SYNCOPE 01/02/2010  . FATIGUE 04/21/2009  . THYROID CANCER, HX OF 04/21/2009  . Osteoporosis 10/28/2011  . Environmental allergies   . Degenerative arthritis of right knee 09/24/2012  . Colonic fistula   . Cancer 1984    thyroid  . SEIZURE DISORDER     "under control"  . Headache(784.0)     hx of  . H/O: whooping cough 73 years old  . GERD (gastroesophageal reflux disease)     "tums controls it"  . Back spasm   . Personal history of arthritis   . Other malignant neoplasm without specification of site   . Menopausal syndrome     Past Surgical History  Procedure Laterality Date  . S/p left radius fracture surgury  2001    Dr. Rhona Raider, Ortho.  . S/p thyroid surgury for cancer  1985    biopsy  . Hx of right retenal detachment   Sept. 2005    partial  . S/p right cataract with lens implant    . Laparoscopic sigmoid colectomy  01/30/2012    Procedure: LAPAROSCOPIC SIGMOID COLECTOMY;  Surgeon: Pedro Earls, MD;  [facility];  Service: General;  Laterality: N/A;  Lap Assisted Sigmoid Colectomy with Takedown of Colovesicle Fistula  .  Take down of intestinal fistula  01/30/2012    Procedure: TAKE DOWN OF INTESTINAL FISTULA;  Surgeon: Pedro Earls, MD;  [facility];  Service: General;  Laterality: N/A;  . Vesico-vaginal fistula repair  01/30/2012    Procedure: FISTULA REPAIR VESICO-VAGINAL;  Surgeon: Reece Packer, MD;  [facility];  Service: Urology;  Laterality: N/A;  Enterovesical Fistula Repair  . Colon surgery    . Total knee arthroplasty Right 02/10/2013    Procedure: TOTAL KNEE ARTHROPLASTY;  Surgeon: Hessie Dibble, MD;  Location: [place];  Service: Orthopedics;  Laterality: Right;  . Fracture surgery      left arm  . Colon resection N/A 07/28/2013    Procedure: LAPAROSCOPIC  SPLENIC FLEXURE mobilizationTAKEDOWN inner enterCOLOVESICLE FISTULAr  , lysis of adhesions.hartman procedure, partial salpingoectomey,   cystoscopy ;  Surgeon: Pedro Earls, MD;  [facility];  Service: General;  Laterality: N/A;  . Vesico-vaginal fistula repair Left 07/28/2013    Procedure: cystoscopy;  Surgeon: Reece Packer, MD;  [facility];  Service: Urology;  Laterality: Left;    Current Outpatient Prescriptions  Medication Sig Dispense Refill  . aspirin EC 81 MG tablet Take 81 mg by mouth daily.      . Calcium Carbonate-Vitamin D (CALTRATE 600+D) 600-400 MG-UNIT per tablet Take 1 tablet by mouth 2 (two) times daily.        Marland Kitchen  carbamazepine (TEGRETOL) 200 MG tablet Take 200-300 mg by mouth 4 (four) times daily. Takes 1.5 tablet every morning and at bedtime and 1 tablet at lunch and dinner      . levothyroxine (SYNTHROID, LEVOTHROID) 100 MCG tablet Take 100 mcg by mouth daily before breakfast.      . losartan (COZAAR) 50 MG tablet Take 50 mg by mouth every morning.       . Magnesium 250 MG TABS Take 1 tablet by mouth daily.       . methocarbamol (ROBAXIN) 500 MG tablet Take 500 mg by mouth every 8 (eight) hours as needed.       . Multiple Vitamin (MULTIVITAMIN WITH MINERALS) TABS tablet Take 1 tablet by  mouth daily.      Marland Kitchen oxyCODONE-acetaminophen (PERCOCET/ROXICET) 5-325 MG per tablet Take 1 tablet by mouth every 8 (eight) hours as needed for moderate pain.       Marland Kitchen tiZANidine (ZANAFLEX) 2 MG tablet Take 2 mg by mouth every 6 (six) hours as needed for muscle spasms.      . vitamin C (ASCORBIC ACID) 250 MG tablet Take 250 mg by mouth daily.       No current facility-administered medications for this visit.   Sulfonamide derivatives; Erythromycin; and Latex Family History  Problem Relation Age of Onset  . Heart disease Mother   . Goiter Mother     mother had i-131 rx for a benign goiter  . Heart disease Father   . Stroke Father   . Bipolar disorder Sister   . Heart attack Brother   . Bipolar disorder Brother   . Seizures Other     Strong family history of seizures, 2 brothers and 2 sisters   Social History:   reports that she quit smoking about 31 years ago. Her smoking use included Cigarettes. She smoked 0.00 packs per day for 10 years. She has never used smokeless tobacco. She reports that she does not drink alcohol or use illicit drugs.   REVIEW OF SYSTEMS : Positive for ## ; otherwise negative  Physical Exam:   Blood pressure 134/78, pulse 74, temperature 97.6 F (36.4 C), height 5\' 11"  (1.803 m), weight 145 lb (65.772 kg). Body mass index is 20.23 kg/(m^2).  Gen:  WDWN white female NAD  Neurological: Alert and oriented to person, place, and time. Motor and sensory function is grossly intact  Head: Normocephalic and atraumatic.  Eyes: Conjunctivae are normal. Pupils are equal, round, and reactive to light. No scleral icterus.  Neck: Normal range of motion. Neck supple. No tracheal deviation or thyromegaly present.  Cardiovascular:  SR without murmurs or gallops.  No carotid bruits Breast:  Not examined Respiratory: Effort normal.  No respiratory distress. No chest wall tenderness. Breath sounds normal.  No wheezes, rales or rhonchi.  Abdomen:  Ostomy in the left lower  quadrant GU:  Not examined Musculoskeletal: Normal range of motion. Extremities are nontender. No cyanosis, edema or clubbing noted Lymphadenopathy: No cervical, preauricular, postauricular or axillary adenopathy is present Skin: Skin is warm and dry. No rash noted. No diaphoresis. No erythema. No pallor. Pscyh: Normal mood and affect. Behavior is normal. Judgment and thought content normal.   LABORATORY RESULTS: No results found for this or any previous visit (from the past 48 hour(s)).   RADIOLOGY RESULTS: No results found.  Problem List: Patient Active Problem List   Diagnosis Date Noted  . Back pain 09/22/2013  . S/P colostomy 08/05/2013  . 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 Degenerative arthritis of right knee 09/24/2012  . Urinary tract infection, site not specified 01/21/2012  . Osteoporosis 10/28/2011  . Menopausal syndrome 09/06/2011  . Preventative health care 08/13/2010  . SYNCOPE 01/02/2010  . GOITER, MULTINODULAR 05/04/2009  . HYPOTHYROIDISM 04/21/2009  . HYPERLIPIDEMIA 04/21/2009  . HYPERTENSION 04/21/2009  . ALLERGIC RHINITIS 04/21/2009  . SEIZURE DISORDER 04/21/2009    Assessment & Plan: And sigmoid colostomy with Jeanette Caprice pouch with residual diverticulitis in the pouch which will require further resection and takedown of colostomy. We'll schedule for elective surgery.    Matt B. Hassell Done, MD, [facility] [phone] beeper [phone]  10/15/2013 10:55 AM

## 2013-10-20 ENCOUNTER — Telehealth: Payer: Self-pay | Admitting: *Deleted

## 2013-10-20 NOTE — Telephone Encounter (Signed)
Patient called and does want to set up her Ultrasound at [facility], please call patient back with information CB# [phone]

## 2013-10-21 NOTE — Telephone Encounter (Signed)
[facility] notifed. They will contact pt about appointment.

## 2013-10-26 ENCOUNTER — Ambulatory Visit (HOSPITAL_COMMUNITY)
Admission: RE | Admit: 2013-10-26 | Discharge: 2013-10-26 | Disposition: A | Discharge status: Home / Self Care | Payer: Medicare Other | Source: Ambulatory Visit | Attending: Endocrinology | Admitting: Endocrinology

## 2013-10-26 DIAGNOSIS — E042 Nontoxic multinodular goiter: Secondary | ICD-10-CM | POA: Insufficient documentation

## 2013-10-26 DIAGNOSIS — R599 Enlarged lymph nodes, unspecified: Secondary | ICD-10-CM | POA: Diagnosis not present

## 2013-10-26 DIAGNOSIS — E079 Disorder of thyroid, unspecified: Secondary | ICD-10-CM | POA: Insufficient documentation

## 2013-11-24 ENCOUNTER — Ambulatory Visit (INDEPENDENT_AMBULATORY_CARE_PROVIDER_SITE_OTHER): Payer: Medicare Other

## 2013-11-24 DIAGNOSIS — Z23 Encounter for immunization: Secondary | ICD-10-CM

## 2013-11-25 ENCOUNTER — Encounter: Payer: Self-pay | Admitting: Internal Medicine

## 2013-11-27 ENCOUNTER — Encounter (HOSPITAL_COMMUNITY): Payer: Self-pay | Admitting: Pharmacy Technician

## 2013-11-27 ENCOUNTER — Other Ambulatory Visit: Payer: Self-pay | Admitting: Internal Medicine

## 2013-12-01 ENCOUNTER — Inpatient Hospital Stay (HOSPITAL_COMMUNITY): Admission: RE | Admit: 2013-12-01 | Payer: Medicare Other | Source: Ambulatory Visit

## 2013-12-01 ENCOUNTER — Encounter (HOSPITAL_COMMUNITY): Payer: Self-pay

## 2013-12-01 ENCOUNTER — Encounter (HOSPITAL_COMMUNITY)
Admission: RE | Admit: 2013-12-01 | Discharge: 2013-12-01 | Disposition: A | Discharge status: Home / Self Care | Payer: Medicare Other | Source: Ambulatory Visit | Attending: Surgery | Admitting: Surgery

## 2013-12-01 ENCOUNTER — Encounter (INDEPENDENT_AMBULATORY_CARE_PROVIDER_SITE_OTHER): Payer: Self-pay

## 2013-12-01 DIAGNOSIS — Z01818 Encounter for other preprocedural examination: Secondary | ICD-10-CM

## 2013-12-01 DIAGNOSIS — Z433 Encounter for attention to colostomy: Secondary | ICD-10-CM | POA: Insufficient documentation

## 2013-12-01 DIAGNOSIS — K5732 Diverticulitis of large intestine without perforation or abscess without bleeding: Secondary | ICD-10-CM | POA: Insufficient documentation

## 2013-12-01 DIAGNOSIS — N321 Vesicointestinal fistula: Secondary | ICD-10-CM

## 2013-12-01 LAB — CBC
HEMATOCRIT: 39 % (ref 36.0–46.0)
Hemoglobin: 13 g/dL (ref 12.0–15.0)
MCH: 30.4 pg (ref 26.0–34.0)
MCHC: 33.3 g/dL (ref 30.0–36.0)
MCV: 91.3 fL (ref 78.0–100.0)
PLATELETS: 212 10*3/uL (ref 150–400)
RBC: 4.27 MIL/uL (ref 3.87–5.11)
RDW: 13.9 % (ref 11.5–15.5)
WBC: 4.9 10*3/uL (ref 4.0–10.5)

## 2013-12-01 LAB — BASIC METABOLIC PANEL
Anion gap: 13 (ref 5–15)
BUN: 19 mg/dL (ref 6–23)
CALCIUM: 9.1 mg/dL (ref 8.4–10.5)
CHLORIDE: 98 meq/L (ref 96–112)
CO2: 25 mEq/L (ref 19–32)
Creatinine, Ser: 0.72 mg/dL (ref 0.50–1.10)
GFR calc Af Amer: >90 mL/min (ref >90)
GFR, EST NON AFRICAN AMERICAN: 84 mL/min — AB (ref >90)
Glucose, Bld: 98 mg/dL (ref 70–99)
Potassium: 4.3 mEq/L (ref 3.7–5.3)
Sodium: 136 mEq/L — ABNORMAL LOW (ref 137–147)

## 2013-12-01 LAB — HEMOGLOBIN A1C
Hgb A1c MFr Bld: 5.6 % (ref <5.7)
Mean Plasma Glucose: 114 mg/dL (ref <117)

## 2013-12-01 NOTE — Pre-Procedure Instructions (Signed)
EKG AND CXR REPORTS ARE IN EPIC FROM 02-02-13.

## 2013-12-01 NOTE — Patient Instructions (Signed)
FOLLOW YOUR BOWEL PREP INSTRUCTIONS AND FLEET ENEMA INSTRUCTIONS DAY BEFORE SURGERY - INSTRUCTIONS FROM DR. MARTIN'S OFFICE. CALL THE OFFICE IF ANY QUESTIONS ABOUT YOUR PREP  YOUR SURGERY IS SCHEDULED AT [facility]  ON:  Friday  9/4  REPORT TO  SHORT STAY CENTER AT:  9:15 AM   PLEASE COME IN THE [place] ENTRANCE AND FOLLOW SIGNS TO SHORT STAY CENTER.  DO NOT EAT OR DRINK ANYTHING AFTER MIDNIGHT THE NIGHT BEFORE YOUR SURGERY.  YOU MAY BRUSH YOUR TEETH, RINSE OUT YOUR MOUTH--BUT NO WATER, NO FOOD, NO CHEWING GUM, NO MINTS, NO CANDIES, NO CHEWING TOBACCO.  PLEASE TAKE THE FOLLOWING MEDICATIONS THE AM OF YOUR SURGERY WITH A FEW SIPS OF WATER:   TEGRETOL, LEVOTHYROXINE.  DO NOT BRING VALUABLES, MONEY, CREDIT CARDS.  DO NOT WEAR JEWELRY, MAKE-UP, NAIL POLISH AND NO METAL PINS OR CLIPS IN YOUR HAIR. CONTACT LENS, DENTURES / PARTIALS, GLASSES SHOULD NOT BE WORN TO SURGERY AND IN MOST CASES-HEARING AIDS WILL NEED TO BE REMOVED.  BRING YOUR GLASSES CASE, ANY EQUIPMENT NEEDED FOR YOUR CONTACT LENS. FOR PATIENTS ADMITTED TO THE HOSPITAL--CHECK OUT TIME THE DAY OF DISCHARGE IS 11:00 AM.  ALL INPATIENT ROOMS ARE PRIVATE - WITH BATHROOM, TELEPHONE, TELEVISION AND WIFI INTERNET.                                                    PLEASE READ OVER ANY  FACT SHEETS THAT YOU WERE GIVEN: MRSA INFORMATION, BLOOD TRANSFUSION INFORMATION, INCENTIVE SPIROMETER INFORMATION.  PLEASE BE AWARE THAT YOU MAY NEED ADDITIONAL BLOOD DRAWN DAY OF YOUR SURGERY  _______________________________________________________________________   [facility] - Preparing for Surgery Before surgery, you can play an important role.  Because skin is not sterile, your skin needs to be as free of germs as possible.  You can reduce the number of germs on your skin by washing with CHG (chlorahexidine gluconate) soap before surgery.  CHG is an antiseptic cleaner which kills germs and bonds with the skin to continue  killing germs even after washing. Please DO NOT use if you have an allergy to CHG or antibacterial soaps.  If your skin becomes reddened/irritated stop using the CHG and inform your nurse when you arrive at Short Stay. Do not shave (including legs and underarms) for at least 48 hours prior to the first CHG shower.  You may shave your face/neck. Please follow these instructions carefully:  1.  Shower with CHG Soap the night before surgery and the  morning of Surgery.  2.  If you choose to wash your hair, wash your hair first as usual with your  normal  shampoo.  3.  After you shampoo, rinse your hair and body thoroughly to remove the  shampoo.                           4.  Use CHG as you would any other liquid soap.  You can apply chg directly  to the skin and wash                       Gently with a scrungie or clean washcloth.  5.  Apply the CHG Soap to your body ONLY FROM THE NECK DOWN.   Do not use on face/ open  Wound or open sores. Avoid contact with eyes, ears mouth and genitals (private parts).                       Wash face,  Genitals (private parts) with your normal soap.             6.  Wash thoroughly, paying special attention to the area where your surgery  will be performed.  7.  Thoroughly rinse your body with warm water from the neck down.  8.  DO NOT shower/wash with your normal soap after using and rinsing off  the CHG Soap.                9.  Pat yourself dry with a clean towel.            10.  Wear clean pajamas.            11.  Place clean sheets on your bed the night of your first shower and do not  sleep with pets. Day of Surgery : Do not apply any lotions/deodorants the morning of surgery.  Please wear clean clothes to the hospital/surgery center.  FAILURE TO FOLLOW THESE INSTRUCTIONS MAY RESULT IN THE CANCELLATION OF YOUR SURGERY PATIENT SIGNATURE_________________________________  NURSE  SIGNATURE__________________________________  ________________________________________________________________________

## 2013-12-04 ENCOUNTER — Encounter (HOSPITAL_COMMUNITY): Payer: Medicare Other | Admitting: Anesthesiology

## 2013-12-04 ENCOUNTER — Inpatient Hospital Stay (HOSPITAL_COMMUNITY): Payer: Medicare Other | Admitting: Anesthesiology

## 2013-12-04 ENCOUNTER — Encounter (HOSPITAL_COMMUNITY): Payer: Self-pay | Admitting: *Deleted

## 2013-12-04 ENCOUNTER — Encounter (HOSPITAL_COMMUNITY)
Admission: RE | Disposition: A | Discharge status: Home Health | Payer: Self-pay | Source: Ambulatory Visit | Attending: Surgery

## 2013-12-04 ENCOUNTER — Inpatient Hospital Stay (HOSPITAL_COMMUNITY)
Admission: RE | Admit: 2013-12-04 | Discharge: 2013-12-14 | DRG: 674 | Disposition: A | Discharge status: Home Health | Payer: Medicare Other | Source: Ambulatory Visit | Attending: Surgery | Admitting: Surgery

## 2013-12-04 DIAGNOSIS — I10 Essential (primary) hypertension: Secondary | ICD-10-CM | POA: Diagnosis present

## 2013-12-04 DIAGNOSIS — Z820 Family history of epilepsy and other diseases of the nervous system: Secondary | ICD-10-CM | POA: Diagnosis not present

## 2013-12-04 DIAGNOSIS — Z433 Encounter for attention to colostomy: Secondary | ICD-10-CM | POA: Diagnosis not present

## 2013-12-04 DIAGNOSIS — K219 Gastro-esophageal reflux disease without esophagitis: Secondary | ICD-10-CM | POA: Diagnosis present

## 2013-12-04 DIAGNOSIS — M1710 Unilateral primary osteoarthritis, unspecified knee: Secondary | ICD-10-CM | POA: Diagnosis present

## 2013-12-04 DIAGNOSIS — Z823 Family history of stroke: Secondary | ICD-10-CM

## 2013-12-04 DIAGNOSIS — N951 Menopausal and female climacteric states: Secondary | ICD-10-CM | POA: Diagnosis present

## 2013-12-04 DIAGNOSIS — N321 Vesicointestinal fistula: Principal | ICD-10-CM | POA: Diagnosis present

## 2013-12-04 DIAGNOSIS — E785 Hyperlipidemia, unspecified: Secondary | ICD-10-CM | POA: Diagnosis present

## 2013-12-04 DIAGNOSIS — Z8249 Family history of ischemic heart disease and other diseases of the circulatory system: Secondary | ICD-10-CM

## 2013-12-04 DIAGNOSIS — Z7982 Long term (current) use of aspirin: Secondary | ICD-10-CM | POA: Diagnosis not present

## 2013-12-04 DIAGNOSIS — E039 Hypothyroidism, unspecified: Secondary | ICD-10-CM | POA: Diagnosis present

## 2013-12-04 DIAGNOSIS — Z79899 Other long term (current) drug therapy: Secondary | ICD-10-CM | POA: Diagnosis not present

## 2013-12-04 DIAGNOSIS — Z9049 Acquired absence of other specified parts of digestive tract: Secondary | ICD-10-CM | POA: Insufficient documentation

## 2013-12-04 DIAGNOSIS — M810 Age-related osteoporosis without current pathological fracture: Secondary | ICD-10-CM | POA: Diagnosis present

## 2013-12-04 DIAGNOSIS — Z87891 Personal history of nicotine dependence: Secondary | ICD-10-CM | POA: Diagnosis not present

## 2013-12-04 DIAGNOSIS — Z85850 Personal history of malignant neoplasm of thyroid: Secondary | ICD-10-CM

## 2013-12-04 DIAGNOSIS — K5732 Diverticulitis of large intestine without perforation or abscess without bleeding: Secondary | ICD-10-CM | POA: Diagnosis present

## 2013-12-04 DIAGNOSIS — Z01812 Encounter for preprocedural laboratory examination: Secondary | ICD-10-CM | POA: Diagnosis not present

## 2013-12-04 DIAGNOSIS — G40909 Epilepsy, unspecified, not intractable, without status epilepticus: Secondary | ICD-10-CM | POA: Diagnosis present

## 2013-12-04 DIAGNOSIS — Z8739 Personal history of other diseases of the musculoskeletal system and connective tissue: Secondary | ICD-10-CM | POA: Diagnosis not present

## 2013-12-04 DIAGNOSIS — Z933 Colostomy status: Secondary | ICD-10-CM

## 2013-12-04 DIAGNOSIS — K660 Peritoneal adhesions (postprocedural) (postinfection): Secondary | ICD-10-CM | POA: Diagnosis present

## 2013-12-04 HISTORY — PX: COLOSTOMY TAKEDOWN: SHX5783

## 2013-12-04 LAB — TYPE AND SCREEN
ABO/RH(D): O POS
Antibody Screen: NEGATIVE

## 2013-12-04 LAB — CBC
HEMATOCRIT: 37.1 % (ref 36.0–46.0)
Hemoglobin: 12.7 g/dL (ref 12.0–15.0)
MCH: 31.3 pg (ref 26.0–34.0)
MCHC: 34.2 g/dL (ref 30.0–36.0)
MCV: 91.4 fL (ref 78.0–100.0)
Platelets: 200 10*3/uL (ref 150–400)
RBC: 4.06 MIL/uL (ref 3.87–5.11)
RDW: 13.6 % (ref 11.5–15.5)
WBC: 17.2 10*3/uL — AB (ref 4.0–10.5)

## 2013-12-04 LAB — CREATININE, SERUM
Creatinine, Ser: 0.61 mg/dL (ref 0.50–1.10)
GFR, EST NON AFRICAN AMERICAN: 89 mL/min — AB (ref >90)

## 2013-12-04 SURGERY — CLOSURE, COLOSTOMY
Anesthesia: General

## 2013-12-04 MED ORDER — CEFOTETAN DISODIUM-DEXTROSE 2-2.08 GM-% IV SOLR
INTRAVENOUS | Status: AC
  Filled 2013-12-04: qty 50

## 2013-12-04 MED ORDER — MIDAZOLAM HCL 2 MG/2ML IJ SOLN
INTRAMUSCULAR | Status: AC
  Filled 2013-12-04: qty 2

## 2013-12-04 MED ORDER — DEXTROSE 5 % IV SOLN
2.0000 g | INTRAVENOUS | Status: AC
  Administered 2013-12-04: 2 g via INTRAVENOUS
  Filled 2013-12-04: qty 2

## 2013-12-04 MED ORDER — GLYCOPYRROLATE 0.2 MG/ML IJ SOLN
INTRAMUSCULAR | Status: AC
  Filled 2013-12-04: qty 2

## 2013-12-04 MED ORDER — HEPARIN SODIUM (PORCINE) 5000 UNIT/ML IJ SOLN
5000.0000 [IU] | Freq: Three times a day (TID) | INTRAMUSCULAR | Status: DC
  Administered 2013-12-05 – 2013-12-14 (×28): 5000 [IU] via SUBCUTANEOUS
  Filled 2013-12-04 (×31): qty 1

## 2013-12-04 MED ORDER — PROPOFOL 10 MG/ML IV BOLUS
INTRAVENOUS | Status: AC
  Filled 2013-12-04: qty 20

## 2013-12-04 MED ORDER — LACTATED RINGERS IV SOLN
INTRAVENOUS | Status: DC | PRN
  Administered 2013-12-04 (×4): via INTRAVENOUS

## 2013-12-04 MED ORDER — ROCURONIUM BROMIDE 100 MG/10ML IV SOLN
INTRAVENOUS | Status: AC
  Filled 2013-12-04: qty 1

## 2013-12-04 MED ORDER — ERYTHROMYCIN BASE 250 MG PO TABS: 1000.0000 mg | ORAL_TABLET | ORAL | Status: DC

## 2013-12-04 MED ORDER — HYDROMORPHONE HCL PF 1 MG/ML IJ SOLN
INTRAMUSCULAR | Status: DC | PRN
  Administered 2013-12-04 (×2): 1 mg via INTRAVENOUS

## 2013-12-04 MED ORDER — ONDANSETRON HCL 4 MG/2ML IJ SOLN
INTRAMUSCULAR | Status: AC
  Filled 2013-12-04: qty 2

## 2013-12-04 MED ORDER — KCL IN DEXTROSE-NACL 20-5-0.45 MEQ/L-%-% IV SOLN
INTRAVENOUS | Status: AC
  Filled 2013-12-04: qty 1000

## 2013-12-04 MED ORDER — NEOSTIGMINE METHYLSULFATE 10 MG/10ML IV SOLN
INTRAVENOUS | Status: AC
  Filled 2013-12-04: qty 1

## 2013-12-04 MED ORDER — 0.9 % SODIUM CHLORIDE (POUR BTL) OPTIME
TOPICAL | Status: DC | PRN
  Administered 2013-12-04: 5000 mL

## 2013-12-04 MED ORDER — MIDAZOLAM HCL 5 MG/5ML IJ SOLN
INTRAMUSCULAR | Status: DC | PRN
  Administered 2013-12-04 (×2): 1 mg via INTRAVENOUS

## 2013-12-04 MED ORDER — MEPERIDINE HCL 50 MG/ML IJ SOLN: 6.2500 mg | INTRAMUSCULAR | Status: DC | PRN

## 2013-12-04 MED ORDER — ONDANSETRON HCL 4 MG/2ML IJ SOLN
4.0000 mg | Freq: Four times a day (QID) | INTRAMUSCULAR | Status: DC | PRN
  Administered 2013-12-04 – 2013-12-05 (×2): 4 mg via INTRAVENOUS
  Filled 2013-12-04 (×2): qty 2

## 2013-12-04 MED ORDER — LABETALOL HCL 5 MG/ML IV SOLN
INTRAVENOUS | Status: AC
  Filled 2013-12-04: qty 4

## 2013-12-04 MED ORDER — LACTATED RINGERS IV SOLN
INTRAVENOUS | Status: DC
  Administered 2013-12-04: 18:00:00 via INTRAVENOUS
  Administered 2013-12-04: 1000 mL via INTRAVENOUS

## 2013-12-04 MED ORDER — CARBAMAZEPINE 200 MG PO TABS
300.0000 mg | ORAL_TABLET | Freq: Two times a day (BID) | ORAL | Status: DC
  Administered 2013-12-04 – 2013-12-14 (×20): 300 mg via ORAL
  Filled 2013-12-04 (×23): qty 1.5

## 2013-12-04 MED ORDER — CARBAMAZEPINE 200 MG PO TABS
200.0000 mg | ORAL_TABLET | Freq: Two times a day (BID) | ORAL | Status: DC
  Administered 2013-12-05 – 2013-12-13 (×17): 200 mg via ORAL
  Filled 2013-12-04 (×20): qty 1

## 2013-12-04 MED ORDER — LABETALOL HCL 5 MG/ML IV SOLN
5.0000 mg | INTRAVENOUS | Status: AC
  Administered 2013-12-04 (×4): 5 mg via INTRAVENOUS

## 2013-12-04 MED ORDER — ONDANSETRON HCL 4 MG PO TABS: 4.0000 mg | ORAL_TABLET | Freq: Four times a day (QID) | ORAL | Status: DC | PRN

## 2013-12-04 MED ORDER — LIDOCAINE HCL (CARDIAC) 20 MG/ML IV SOLN
INTRAVENOUS | Status: DC | PRN
  Administered 2013-12-04: 60 mg via INTRAVENOUS

## 2013-12-04 MED ORDER — GLYCOPYRROLATE 0.2 MG/ML IJ SOLN
INTRAMUSCULAR | Status: DC | PRN
  Administered 2013-12-04: 0.4 mg via INTRAVENOUS

## 2013-12-04 MED ORDER — HYDROMORPHONE HCL PF 1 MG/ML IJ SOLN
INTRAMUSCULAR | Status: AC
  Filled 2013-12-04: qty 1

## 2013-12-04 MED ORDER — FLEET ENEMA 7-19 GM/118ML RE ENEM: 1.0000 | ENEMA | Freq: Once | RECTAL | Status: DC

## 2013-12-04 MED ORDER — PROMETHAZINE HCL 25 MG/ML IJ SOLN: 6.2500 mg | INTRAMUSCULAR | Status: DC | PRN

## 2013-12-04 MED ORDER — KCL IN DEXTROSE-NACL 20-5-0.45 MEQ/L-%-% IV SOLN
INTRAVENOUS | Status: DC
  Administered 2013-12-04 – 2013-12-05 (×4): via INTRAVENOUS
  Administered 2013-12-06: 100 mL/h via INTRAVENOUS
  Administered 2013-12-06 – 2013-12-09 (×6): via INTRAVENOUS
  Administered 2013-12-09: 75 mL via INTRAVENOUS
  Administered 2013-12-10 – 2013-12-11 (×2): via INTRAVENOUS
  Administered 2013-12-11: 75 mL via INTRAVENOUS
  Administered 2013-12-12 – 2013-12-13 (×2): 75 mL/h via INTRAVENOUS
  Administered 2013-12-13: 22:00:00 via INTRAVENOUS
  Filled 2013-12-04 (×23): qty 1000

## 2013-12-04 MED ORDER — NEOSTIGMINE METHYLSULFATE 10 MG/10ML IV SOLN
INTRAVENOUS | Status: DC | PRN
  Administered 2013-12-04: 3 mg via INTRAVENOUS

## 2013-12-04 MED ORDER — PEG 3350-KCL-NA BICARB-NACL 420 G PO SOLR: 4000.0000 mL | Freq: Once | ORAL | Status: DC

## 2013-12-04 MED ORDER — PROPOFOL 10 MG/ML IV BOLUS
INTRAVENOUS | Status: DC | PRN
  Administered 2013-12-04: 40 mg via INTRAVENOUS
  Administered 2013-12-04: 130 mg via INTRAVENOUS

## 2013-12-04 MED ORDER — LIDOCAINE HCL (CARDIAC) 20 MG/ML IV SOLN
INTRAVENOUS | Status: AC
  Filled 2013-12-04: qty 5

## 2013-12-04 MED ORDER — FENTANYL CITRATE 0.05 MG/ML IJ SOLN
INTRAMUSCULAR | Status: AC
  Filled 2013-12-04: qty 2

## 2013-12-04 MED ORDER — NEOMYCIN SULFATE 500 MG PO TABS: 1000.0000 mg | ORAL_TABLET | ORAL | Status: DC

## 2013-12-04 MED ORDER — HYDROMORPHONE HCL PF 2 MG/ML IJ SOLN
INTRAMUSCULAR | Status: AC
  Filled 2013-12-04: qty 1

## 2013-12-04 MED ORDER — FENTANYL CITRATE 0.05 MG/ML IJ SOLN
INTRAMUSCULAR | Status: AC
  Filled 2013-12-04: qty 5

## 2013-12-04 MED ORDER — HEPARIN SODIUM (PORCINE) 5000 UNIT/ML IJ SOLN
5000.0000 [IU] | Freq: Once | INTRAMUSCULAR | Status: AC
  Administered 2013-12-04: 5000 [IU] via SUBCUTANEOUS
  Filled 2013-12-04: qty 1

## 2013-12-04 MED ORDER — FENTANYL CITRATE 0.05 MG/ML IJ SOLN
INTRAMUSCULAR | Status: DC | PRN
  Administered 2013-12-04 (×3): 50 ug via INTRAVENOUS
  Administered 2013-12-04: 150 ug via INTRAVENOUS
  Administered 2013-12-04 (×2): 50 ug via INTRAVENOUS
  Administered 2013-12-04: 100 ug via INTRAVENOUS

## 2013-12-04 MED ORDER — CARBAMAZEPINE 200 MG PO TABS: 200.0000 mg | ORAL_TABLET | Freq: Four times a day (QID) | ORAL | Status: DC

## 2013-12-04 MED ORDER — HYDROMORPHONE HCL PF 1 MG/ML IJ SOLN
0.2500 mg | INTRAMUSCULAR | Status: DC | PRN
  Administered 2013-12-04 (×4): 0.5 mg via INTRAVENOUS

## 2013-12-04 MED ORDER — ONDANSETRON HCL 4 MG/2ML IJ SOLN
INTRAMUSCULAR | Status: DC | PRN
  Administered 2013-12-04: 4 mg via INTRAVENOUS

## 2013-12-04 MED ORDER — MORPHINE SULFATE 2 MG/ML IJ SOLN
2.0000 mg | INTRAMUSCULAR | Status: DC | PRN
  Administered 2013-12-04 – 2013-12-05 (×4): 2 mg via INTRAVENOUS
  Filled 2013-12-04 (×4): qty 1

## 2013-12-04 MED ORDER — LIP MEDEX EX OINT
TOPICAL_OINTMENT | CUTANEOUS | Status: AC
  Administered 2013-12-04: 21:00:00
  Filled 2013-12-04: qty 7

## 2013-12-04 MED ORDER — CEFOTETAN DISODIUM 2 G IJ SOLR
2.0000 g | Freq: Two times a day (BID) | INTRAMUSCULAR | Status: AC
  Administered 2013-12-04: 2 g via INTRAVENOUS
  Filled 2013-12-04: qty 2

## 2013-12-04 MED ORDER — FENTANYL CITRATE 0.05 MG/ML IJ SOLN
25.0000 ug | INTRAMUSCULAR | Status: DC | PRN
  Administered 2013-12-04 (×3): 50 ug via INTRAVENOUS

## 2013-12-04 MED ORDER — ROCURONIUM BROMIDE 100 MG/10ML IV SOLN
INTRAVENOUS | Status: DC | PRN
  Administered 2013-12-04 (×3): 20 mg via INTRAVENOUS
  Administered 2013-12-04: 10 mg via INTRAVENOUS
  Administered 2013-12-04 (×3): 20 mg via INTRAVENOUS
  Administered 2013-12-04: 10 mg via INTRAVENOUS
  Administered 2013-12-04: 20 mg via INTRAVENOUS
  Administered 2013-12-04: 50 mg via INTRAVENOUS
  Administered 2013-12-04: 20 mg via INTRAVENOUS

## 2013-12-04 SURGICAL SUPPLY — 68 items
APPLICATOR COTTON TIP 6IN STRL (MISCELLANEOUS) ×2 IMPLANT
BLADE EXTENDED COATED 6.5IN (ELECTRODE) ×2 IMPLANT
BLADE HEX COATED 2.75 (ELECTRODE) ×6 IMPLANT
BLADE SURG SZ10 CARB STEEL (BLADE) ×3 IMPLANT
CANISTER SUCTION 2500CC (MISCELLANEOUS) ×1 IMPLANT
CATH FOLEY LATEX FREE 16FR (CATHETERS) ×2 IMPLANT
COVER MAYO STAND STRL (DRAPES) ×6 IMPLANT
DRAIN CHANNEL RND F F (WOUND CARE) ×2 IMPLANT
DRAPE LAPAROSCOPIC ABDOMINAL (DRAPES) ×3 IMPLANT
DRAPE LG THREE QUARTER DISP (DRAPES) ×3 IMPLANT
DRAPE SURG IRRIG POUCH 19X23 (DRAPES) ×2 IMPLANT
DRAPE WARM FLUID 44X44 (DRAPE) ×3 IMPLANT
ELECT REM PT RETURN 9FT ADLT (ELECTROSURGICAL) ×3
ELECTRODE REM PT RTRN 9FT ADLT (ELECTROSURGICAL) ×1 IMPLANT
EVACUATOR SILICONE 100CC (DRAIN) ×2 IMPLANT
GAUZE SPONGE 4X4 12PLY STRL (GAUZE/BANDAGES/DRESSINGS) ×3 IMPLANT
GLOVE BIOGEL M 8.0 STRL (GLOVE) ×2 IMPLANT
GLOVE BIOGEL PI IND STRL 7.0 (GLOVE) IMPLANT
GLOVE BIOGEL PI IND STRL 7.5 (GLOVE) IMPLANT
GLOVE BIOGEL PI IND STRL 8 (GLOVE) IMPLANT
GLOVE BIOGEL PI INDICATOR 7.0 (GLOVE) ×8
GLOVE BIOGEL PI INDICATOR 7.5 (GLOVE) ×4
GLOVE BIOGEL PI INDICATOR 8 (GLOVE) ×4
GOWN SPEC L4 XLG W/TWL (GOWN DISPOSABLE) ×1 IMPLANT
GOWN STRL REUS W/TWL XL LVL3 (GOWN DISPOSABLE) ×15 IMPLANT
KIT BASIN OR (CUSTOM PROCEDURE TRAY) ×5 IMPLANT
LEGGING LITHOTOMY PAIR STRL (DRAPES) ×2 IMPLANT
LIGASURE IMPACT 36 18CM CVD LR (INSTRUMENTS) IMPLANT
MANIFOLD NEPTUNE II (INSTRUMENTS) ×2 IMPLANT
NS IRRIG 1000ML POUR BTL (IV SOLUTION) ×4 IMPLANT
PACK GENERAL/GYN (CUSTOM PROCEDURE TRAY) ×5 IMPLANT
PAD ABD 8X10 STRL (GAUZE/BANDAGES/DRESSINGS) ×4 IMPLANT
SPONGE DRAIN TRACH 4X4 STRL 2S (GAUZE/BANDAGES/DRESSINGS) ×2 IMPLANT
STAPLER CUT CVD 40MM BLUE (STAPLE) ×2 IMPLANT
STAPLER CUT RELOAD BLUE (STAPLE) ×2 IMPLANT
STAPLER VISISTAT 35W (STAPLE) ×3 IMPLANT
SUCTION POOLE TIP (SUCTIONS) ×3 IMPLANT
SUT ETHILON 2 0 PS N (SUTURE) ×2 IMPLANT
SUT ETHILON 3 0 PS 1 (SUTURE) ×8 IMPLANT
SUT NOV 1 T60/GS (SUTURE) IMPLANT
SUT NOVA 1 T20/GS 25DT (SUTURE) ×10 IMPLANT
SUT NOVA NAB DX-16 0-1 5-0 T12 (SUTURE) IMPLANT
SUT NOVA T20/GS 25 (SUTURE) IMPLANT
SUT NYLON 3 0 (SUTURE) ×10 IMPLANT
SUT PDS AB 1 CTX 36 (SUTURE) IMPLANT
SUT PDS AB 1 TP1 96 (SUTURE) IMPLANT
SUT PDS AB 3-0 SH 27 (SUTURE) IMPLANT
SUT PDS AB 4-0 SH 27 (SUTURE) ×4 IMPLANT
SUT SILK 2 0 (SUTURE) ×3
SUT SILK 2 0 SH CR/8 (SUTURE) ×3 IMPLANT
SUT SILK 2 0SH CR/8 30 (SUTURE) IMPLANT
SUT SILK 2-0 18XBRD TIE 12 (SUTURE) ×1 IMPLANT
SUT SILK 2-0 30XBRD TIE 12 (SUTURE) IMPLANT
SUT SILK 3 0 (SUTURE) ×3
SUT SILK 3 0 SH CR/8 (SUTURE) ×7 IMPLANT
SUT SILK 3-0 18XBRD TIE 12 (SUTURE) ×1 IMPLANT
SUT VIC AB 2-0 SH 18 (SUTURE) ×2 IMPLANT
SUT VIC AB 2-0 SH 27 (SUTURE) ×3
SUT VIC AB 2-0 SH 27X BRD (SUTURE) IMPLANT
SUT VIC AB 3-0 54XBRD REEL (SUTURE) IMPLANT
SUT VIC AB 3-0 BRD 54 (SUTURE)
SUT VICRYL 2 0 18  UND BR (SUTURE)
SUT VICRYL 2 0 18 UND BR (SUTURE) ×1 IMPLANT
TAPE CLOTH SURG 6X10 WHT LF (GAUZE/BANDAGES/DRESSINGS) ×2 IMPLANT
TOWEL OR 17X26 10 PK STRL BLUE (TOWEL DISPOSABLE) ×5 IMPLANT
TOWEL OR NON WOVEN STRL DISP B (DISPOSABLE) ×6 IMPLANT
TRAY FOLEY CATH 14FRSI W/METER (CATHETERS) ×1 IMPLANT
YANKAUER SUCT BULB TIP NO VENT (SUCTIONS) ×1 IMPLANT

## 2013-12-04 NOTE — Anesthesia Postprocedure Evaluation (Signed)
  Anesthesia Post-op Note  Patient: Tammy Duke  Procedure(s) Performed: Procedure(s) (LRB): COLOSTOMY TAKEDOWN (HARTMAN)/takedown of Hartman pouch and end colostomy (N/A)  Patient Location: PACU  Anesthesia Type: General  Level of Consciousness: awake and alert   Airway and Oxygen Therapy: Patient Spontanous Breathing  Post-op Pain: mild  Post-op Assessment: Post-op Vital signs reviewed, Patient's Cardiovascular Status Stable, Respiratory Function Stable, Patent Airway and No signs of Nausea or vomiting  Last Vitals:  Filed Vitals:   12/04/13 1715  BP: 193/90  Pulse: 97  Temp:   Resp: 19    Post-op Vital Signs: stable   Complications: No apparent anesthesia complications

## 2013-12-04 NOTE — Transfer of Care (Signed)
Immediate Anesthesia Transfer of Care Note  Patient: Tammy Duke  Procedure(s) Performed: Procedure(s): COLOSTOMY TAKEDOWN (HARTMAN)/takedown of Hartman pouch and end colostomy (N/A)  Patient Location: PACU  Anesthesia Type:General  Level of Consciousness: awake, alert  and oriented  Airway & Oxygen Therapy: Patient Spontanous Breathing and Patient connected to face mask oxygen  Post-op Assessment: Report given to PACU RN and Post -op Vital signs reviewed and stable  Post vital signs: Reviewed and stable  Complications: No apparent anesthesia complications

## 2013-12-04 NOTE — Brief Op Note (Signed)
12/04/2013  4:40 PM  PATIENT:  Tammy Duke  73 y.o. 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     PROCEDURE:  Procedure(s): COLOSTOMY TAKEDOWN (HARTMAN)/takedown of Hartman pouch and end colostomy (N/A)  SURGEON:  Surgeon(s) and Role:    * Kaylyn Lim, MD - Primary    * Ralene Ok, MD - Assisting  PHYSICIAN ASSISTANT:   ASSISTANTS: Ralene Ok, MD, FACS   ANESTHESIA:   general  EBL:  Total I/O In: 3000 [I.V.:3000] Out: 400 [Urine:300; Blood:100]  BLOOD ADMINISTERED:none  DRAINS: (1) Jackson-Pratt drain(s) with closed bulb suction in the pelvis   LOCAL MEDICATIONS USED:  NONE  SPECIMEN:  No Specimen  DISPOSITION OF SPECIMEN:  N/A  COUNTS:  YES  TOURNIQUET:  * No tourniquets in log *  DICTATION: .Other Dictation: Dictation Number [id number]  PLAN OF CARE: Admit to inpatient   PATIENT DISPOSITION:  PACU - hemodynamically stable.   Delay start of Pharmacological VTE agent (>24hrs) due to surgical blood loss or risk of bleeding: no

## 2013-12-04 NOTE — Interval H&P Note (Signed)
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 After consideration of risks, benefits and other options for treatment, the patient has consented to  Procedure(s): COLOSTOMY TAKEDOWN (HARTMAN)/takedown of Hartman pouch and end colostomy (N/A) as a surgical intervention .  The patient's history has been reviewed, patient examined, no change in status, stable for surgery.  I have reviewed the patient's chart and labs.  Questions were answered to the patient's satisfaction.     [name]

## 2013-12-04 NOTE — Anesthesia Preprocedure Evaluation (Addendum)
Anesthesia Evaluation  Patient identified by MRN, date of birth, ID band Patient awake    Reviewed: Allergy & Precautions, H&P , NPO status , Patient's Chart, lab work & pertinent test results  Airway Mallampati: II TM Distance: >3 FB Neck ROM: Full    Dental no notable dental hx.    Pulmonary neg pulmonary ROS, former smoker,  breath sounds clear to auscultation  Pulmonary exam normal       Cardiovascular hypertension, Pt. on medications Rhythm:Regular Rate:Normal     Neuro/Psych Seizures -, Well Controlled,  negative psych ROS   GI/Hepatic negative GI ROS, Neg liver ROS,   Endo/Other  Hypothyroidism   Renal/GU negative Renal ROS  negative genitourinary   Musculoskeletal negative musculoskeletal ROS (+)   Abdominal   Peds negative pediatric ROS (+)  Hematology negative hematology ROS (+)   Anesthesia Other Findings   Reproductive/Obstetrics negative OB ROS                          Anesthesia Physical  Anesthesia Plan  ASA: III  Anesthesia Plan: General   Post-op Pain Management:    Induction: Intravenous  Airway Management Planned: Oral ETT  Additional Equipment:   Intra-op Plan:   Post-operative Plan: Extubation in OR  Informed Consent: I have reviewed the patients History and Physical, chart, labs and discussed the procedure including the risks, benefits and alternatives for the proposed anesthesia with the patient or authorized representative who has indicated his/her understanding and acceptance.   Dental advisory given  Plan Discussed with: CRNA and Surgeon  Anesthesia Plan Comments:         Anesthesia Quick Evaluation

## 2013-12-04 NOTE — H&P (Signed)
Chief Complaint: Ostomy for takedown  History of Present Illness: Tammy Duke is an 73 y.o. 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 She is currently awaiting takedown of her present ostomy in August which will be 3 months from her prior surgery. I have discussed this with her and the risks of surgery and possilble need to go to [facility] after surgery-hopefully not.  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 This will likely require a low anterior anastomosis after further mobilization of her proximal colon.  Past Medical History   Diagnosis  Date   .  GOITER, MULTINODULAR  05/04/2009   .  HYPOTHYROIDISM  04/21/2009   .  HYPERTENSION  04/21/2009   .  ALLERGIC RHINITIS  04/21/2009   .  SYNCOPE  01/02/2010   .  FATIGUE  04/21/2009   .  THYROID CANCER, HX OF  04/21/2009   .  Osteoporosis  10/28/2011   .  Environmental allergies    .  Degenerative arthritis of right knee  09/24/2012   .  Colonic fistula    .  Cancer  1984     thyroid   .  SEIZURE DISORDER      "under control"   .  Headache(784.0)      hx of   .  H/O: whooping cough  73 years old   .  GERD (gastroesophageal reflux disease)      "tums controls it"   .  Back spasm    .  Personal history of arthritis    .  Other malignant neoplasm without specification of site    .  Menopausal syndrome     Past Surgical History   Procedure  Laterality  Date   .  S/p left radius fracture surgury   2001     Dr. Rhona Raider, Ortho.   .  S/p thyroid surgury for cancer   1985     biopsy   .  Hx of right retenal detachment   Sept. 2005     partial   .  S/p right cataract with lens implant     .  Laparoscopic sigmoid colectomy   01/30/2012     Procedure: LAPAROSCOPIC SIGMOID COLECTOMY; Surgeon: Pedro Earls, MD; Location: [facility]; Service: General; Laterality: N/A; Lap Assisted  Sigmoid Colectomy with Takedown of Colovesicle Fistula   .  Take down of intestinal fistula   01/30/2012     Procedure: TAKE DOWN OF INTESTINAL FISTULA; Surgeon: Pedro Earls, MD; Location: [facility]; Service: General; Laterality: N/A;   .  Vesico-vaginal fistula repair   01/30/2012     Procedure: FISTULA REPAIR VESICO-VAGINAL; Surgeon: Reece Packer, MD; Location: [facility]; Service: Urology; Laterality: N/A; Enterovesical Fistula Repair   .  Colon surgery     .  Total knee arthroplasty  Right  02/10/2013     Procedure: TOTAL KNEE ARTHROPLASTY; Surgeon: Hessie Dibble, MD; Location: [place]; Service: Orthopedics; Laterality: Right;   .  Fracture surgery       left arm   .  Colon resection  N/A  07/28/2013     Procedure: LAPAROSCOPIC SPLENIC FLEXURE mobilizationTAKEDOWN inner enterCOLOVESICLE FISTULAr , lysis of adhesions.hartman procedure, partial salpingoectomey, cystoscopy ; Surgeon: Pedro Earls, MD; Location: [facility]; Service: General; Laterality: N/A;   .  Vesico-vaginal fistula repair  Left  07/28/2013  Procedure: cystoscopy; Surgeon: Reece Packer, MD; Location: [facility]; Service: Urology; Laterality: Left;    Current Outpatient Prescriptions   Medication  Sig  Dispense  Refill   .  aspirin EC 81 MG tablet  Take 81 mg by mouth daily.     .  Calcium Carbonate-Vitamin D (CALTRATE 600+D) 600-400 MG-UNIT per tablet  Take 1 tablet by mouth 2 (two) times daily.     .  carbamazepine (TEGRETOL) 200 MG tablet  Take 200-300 mg by mouth 4 (four) times daily. Takes 1.5 tablet every morning and at bedtime and 1 tablet at lunch and dinner     .  levothyroxine (SYNTHROID, LEVOTHROID) 100 MCG tablet  Take 100 mcg by mouth daily before breakfast.     .  losartan (COZAAR) 50 MG tablet  Take 50 mg by mouth every morning.     .  Magnesium 250 MG TABS  Take 1 tablet by mouth daily.     .  methocarbamol (ROBAXIN) 500 MG tablet  Take 500 mg by mouth every 8 (eight) hours as needed.     .  Multiple  Vitamin (MULTIVITAMIN WITH MINERALS) TABS tablet  Take 1 tablet by mouth daily.     Marland Kitchen  oxyCODONE-acetaminophen (PERCOCET/ROXICET) 5-325 MG per tablet  Take 1 tablet by mouth every 8 (eight) hours as needed for moderate pain.     Marland Kitchen  tiZANidine (ZANAFLEX) 2 MG tablet  Take 2 mg by mouth every 6 (six) hours as needed for muscle spasms.     .  vitamin C (ASCORBIC ACID) 250 MG tablet  Take 250 mg by mouth daily.      No current facility-administered medications for this visit.   Sulfonamide derivatives; Erythromycin; and Latex  Family History   Problem  Relation  Age of Onset   .  Heart disease  Mother    .  Goiter  Mother      mother had i-131 rx for a benign goiter   .  Heart disease  Father    .  Stroke  Father    .  Bipolar disorder  Sister    .  Heart attack  Brother    .  Bipolar disorder  Brother    .  Seizures  Other      Strong family history of seizures, 2 brothers and 2 sisters   Social History: reports that she quit smoking about 31 years ago. Her smoking use included Cigarettes. She smoked 0.00 packs per day for 10 years. She has never used smokeless tobacco. She reports that she does not drink alcohol or use illicit drugs.  REVIEW OF SYSTEMS :  Positive for ## ; otherwise negative  Physical Exam:  Blood pressure 134/78, pulse 74, temperature 97.6 F (36.4 C), height 5\' 11"  (1.803 m), weight 145 lb (65.772 kg).  Body mass index is 20.23 kg/(m^2).  Gen: WDWN white female NAD  Neurological: Alert and oriented to person, place, and time. Motor and sensory function is grossly intact  Head: Normocephalic and atraumatic.  Eyes: Conjunctivae are normal. Pupils are equal, round, and reactive to light. No scleral icterus.  Neck: Normal range of motion. Neck supple. No tracheal deviation or thyromegaly present.  Cardiovascular: SR without murmurs or gallops. No carotid bruits  Breast: Not examined  Respiratory: Effort normal. No respiratory distress. No chest wall tenderness. Breath  sounds normal. No wheezes, rales or rhonchi.  Abdomen: Ostomy in the left lower quadrant  GU: Not examined  Musculoskeletal:  Normal range of motion. Extremities are nontender. No cyanosis, edema or clubbing noted Lymphadenopathy: No cervical, preauricular, postauricular or axillary adenopathy is present Skin: Skin is warm and dry. No rash noted. No diaphoresis. No erythema. No pallor. Pscyh: Normal mood and affect. Behavior is normal. Judgment and thought content normal.  LABORATORY RESULTS:  No results found for this or any previous visit (from the past 48 hour(s)).  RADIOLOGY RESULTS:  No results found.  Problem List:  Patient Active Problem List    Diagnosis  Date Noted   .  Back pain  09/22/2013   .  S/P colostomy  08/05/2013   .  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  Degenerative arthritis of right knee  09/24/2012   .  Urinary tract infection, site not specified  01/21/2012   .  Osteoporosis  10/28/2011   .  Menopausal syndrome  09/06/2011   .  Preventative health care  08/13/2010   .  SYNCOPE  01/02/2010   .  GOITER, MULTINODULAR  05/04/2009   .  HYPOTHYROIDISM  04/21/2009   .  HYPERLIPIDEMIA  04/21/2009   .  HYPERTENSION  04/21/2009   .  ALLERGIC RHINITIS  04/21/2009   .  SEIZURE DISORDER  04/21/2009   Assessment & Plan:  And sigmoid colostomy with Jeanette Caprice pouch with residual diverticulitis in the pouch which will require further resection and takedown of colostomy. We'll schedule for elective surgery.  Matt B. Hassell Done, MD, [facility]  [phone] beeper  [phone]

## 2013-12-05 DIAGNOSIS — Z933 Colostomy status: Secondary | ICD-10-CM

## 2013-12-05 LAB — CBC
HCT: 36.4 % (ref 36.0–46.0)
HEMOGLOBIN: 12.5 g/dL (ref 12.0–15.0)
MCH: 31.1 pg (ref 26.0–34.0)
MCHC: 34.3 g/dL (ref 30.0–36.0)
MCV: 90.5 fL (ref 78.0–100.0)
Platelets: 210 10*3/uL (ref 150–400)
RBC: 4.02 MIL/uL (ref 3.87–5.11)
RDW: 14 % (ref 11.5–15.5)
WBC: 17.2 10*3/uL — AB (ref 4.0–10.5)

## 2013-12-05 LAB — BASIC METABOLIC PANEL
Anion gap: 13 (ref 5–15)
BUN: 9 mg/dL (ref 6–23)
CHLORIDE: 95 meq/L — AB (ref 96–112)
CO2: 23 mEq/L (ref 19–32)
Calcium: 8 mg/dL — ABNORMAL LOW (ref 8.4–10.5)
Creatinine, Ser: 0.63 mg/dL (ref 0.50–1.10)
GFR calc non Af Amer: 88 mL/min — ABNORMAL LOW (ref >90)
GLUCOSE: 194 mg/dL — AB (ref 70–99)
POTASSIUM: 3.9 meq/L (ref 3.7–5.3)
SODIUM: 131 meq/L — AB (ref 137–147)

## 2013-12-05 MED ORDER — DIPHENHYDRAMINE HCL 12.5 MG/5ML PO ELIX: 12.5000 mg | ORAL_SOLUTION | Freq: Four times a day (QID) | ORAL | Status: DC | PRN

## 2013-12-05 MED ORDER — DIPHENHYDRAMINE HCL 50 MG/ML IJ SOLN: 12.5000 mg | Freq: Four times a day (QID) | INTRAMUSCULAR | Status: DC | PRN

## 2013-12-05 MED ORDER — ONDANSETRON HCL 4 MG PO TABS: 4.0000 mg | ORAL_TABLET | Freq: Four times a day (QID) | ORAL | Status: DC | PRN

## 2013-12-05 MED ORDER — ONDANSETRON HCL 4 MG/2ML IJ SOLN
4.0000 mg | Freq: Four times a day (QID) | INTRAMUSCULAR | Status: DC | PRN
  Administered 2013-12-05 (×2): 4 mg via INTRAVENOUS

## 2013-12-05 MED ORDER — PANTOPRAZOLE SODIUM 40 MG IV SOLR
40.0000 mg | Freq: Every day | INTRAVENOUS | Status: DC
  Administered 2013-12-05 – 2013-12-06 (×3): 40 mg via INTRAVENOUS
  Filled 2013-12-05 (×4): qty 40

## 2013-12-05 MED ORDER — SODIUM CHLORIDE 0.9 % IJ SOLN: 9.0000 mL | INTRAMUSCULAR | Status: DC | PRN

## 2013-12-05 MED ORDER — MORPHINE SULFATE (PF) 1 MG/ML IV SOLN
INTRAVENOUS | Status: DC
  Administered 2013-12-05: 12 mg via INTRAVENOUS
  Administered 2013-12-05: 6 mg via INTRAVENOUS
  Administered 2013-12-05: 09:00:00 via INTRAVENOUS
  Administered 2013-12-05 (×2): 6 mg via INTRAVENOUS
  Administered 2013-12-06 (×2): 7.5 mg via INTRAVENOUS
  Administered 2013-12-06: 8.43 mg via INTRAVENOUS
  Administered 2013-12-06: 20:00:00 via INTRAVENOUS
  Administered 2013-12-06: 19 mg via INTRAVENOUS
  Administered 2013-12-06: 11:00:00 via INTRAVENOUS
  Administered 2013-12-06: 7.5 mg via INTRAVENOUS
  Administered 2013-12-07: 15 mg via INTRAVENOUS
  Administered 2013-12-07: 4.5 mg via INTRAVENOUS
  Administered 2013-12-07: 4.44 mg via INTRAVENOUS
  Administered 2013-12-07: 21:00:00 via INTRAVENOUS
  Administered 2013-12-07: 7.5 mg via INTRAVENOUS
  Administered 2013-12-08: 9 mg via INTRAVENOUS
  Administered 2013-12-08: 6 mg via INTRAVENOUS
  Administered 2013-12-08: 14:00:00 via INTRAVENOUS
  Administered 2013-12-08: 13.5 mg via INTRAVENOUS
  Administered 2013-12-09: 3 mg via INTRAVENOUS
  Administered 2013-12-09: 08:00:00 via INTRAVENOUS
  Administered 2013-12-09: 3.5 mg via INTRAVENOUS
  Administered 2013-12-09: 6 mg via INTRAVENOUS
  Administered 2013-12-09: 7.27 mL via INTRAVENOUS
  Filled 2013-12-05 (×9): qty 25

## 2013-12-05 MED ORDER — ONDANSETRON HCL 4 MG/2ML IJ SOLN
4.0000 mg | INTRAMUSCULAR | Status: DC | PRN
  Filled 2013-12-05 (×2): qty 2

## 2013-12-05 MED ORDER — LABETALOL HCL 5 MG/ML IV SOLN
10.0000 mg | INTRAVENOUS | Status: DC | PRN
  Administered 2013-12-07 – 2013-12-13 (×3): 10 mg via INTRAVENOUS
  Filled 2013-12-05 (×4): qty 4

## 2013-12-05 MED ORDER — NALOXONE HCL 0.4 MG/ML IJ SOLN: 0.4000 mg | INTRAMUSCULAR | Status: DC | PRN

## 2013-12-05 NOTE — Op Note (Signed)
Tammy Duke, CYPHERS            ACCOUNT NO.:  [id number]  MEDICAL RECORD NO.:  [id number]  LOCATION:  78                         FACILITY:  [facility]  PHYSICIAN:  Isabel Caprice. Hassell Done, MD  DATE OF BIRTH:  Jul 20, 1940  DATE OF PROCEDURE:  12/04/2013 DATE OF DISCHARGE:                              OPERATIVE REPORT   PREOPERATIVE DIAGNOSIS:  Jeanette Caprice procedure with end colostomy and prior colovesical fistula.  PROCEDURE:  Takedown of Hartmann pouch and end colostomy with hand-sewn pelvic anastomosis.  SURGEON:  Isabel Caprice. Hassell Done, MD  ASSISTANT:  Ralene Ok, MD  DESCRIPTION OF PROCEDURE:  The patient was taken to room 1 and placed in Yellofins stirrups.  After anesthesia was administered and she was prepped with PCMX, a sponge was placed into her vagina.  This was for identification purposes.  After a time-out, the procedure began by first excising her old incision.  Previously, her ostomy has been sewn shut. I entered the abdomen, found some very dense thick adhesions, and I spent about an hour in doing enterolysis of these thick adhesions not only involving the anterior abdominal wall, but also the pelvis.  Sharp dissection and the Bovie were used to free up the small bowel loops which were stuck down the pelvis and colon which was stuck down to the right and over the left ovary.  Ultimately, I found the end of Hartmann pouch which had Prolene sutures on it and this took me down to my previous Hartmann or Mirant anastomosis from my first anastomosis and ended up going below that and using the curvilinear stapler with a blue load, I divided down below.  Prior to doing this, I did scope her from below, but realize that I would never be able to get up that high within an EEA and the colon was stuck down to the uterus, and there was no mobilizing it off there.  These were extremely dense adhesions, and I felt that to do anymore with jeopardized being able to create  an anastomosis.  Prior to completing this, however, I did open the prior [place] anastomosis and put my finger down to the sacrum.  I do carry my incision above the umbilicus in order to complete the mobilization of the left colon.  Unfortunately, I had already taken it down.  I was easily able to bring it down where I could perform an anastomosis in the pelvis under no tension.  Also, I would say the small bowel loops were freed up to where I had previously done an enteroenterostomy and the others were pretty well stuck to the omentum. I took those adhesions off as well.  I then performed a hand-sewn side-to-end Freda Munro anastomosis cutting off the staple line.  The ostomy did have some spillage, although this had a colon prep, there was some spillage on the sidewall of the incision.  We controlled the spillage and we did the anastomosis with a bowel clamp.  This was a 2 layer hand-sewn anastomosis with the back row of 3-0 silks and inner layer of running 4-0 PDS on the back row in a running locking fashion.  Anteriorly, in a canal Mayo fashion. Second layer of silk was  used to complete that anastomosis.  We then removed all of the contaminated equipment and I went ahead and wiped off the anterior abdominal wall with PCMX.  We then changed our gowns and gloves and we had fresh equipment.  I then closed the end colostomy with #1 Novafil pop-offs and on the inside with a running 2-0 Vicryl.  The midline incision was closed with #1 Novafil far-near-near-far figure- of-eight to complete the closure.  The incisions were closed with vertical mattress sutures that were left to be delayed primary closure and the wound was packed with saline soaked gauze.  The patient tolerated the procedure well.  She was taken to the recovery room in satisfactory condition.     Isabel Caprice Hassell Done, MD     MBM/MEDQ  D:  12/04/2013  T:  12/04/2013  Job:  [id number]

## 2013-12-05 NOTE — Progress Notes (Signed)
1 Day Post-Op  Subjective: Having a lot of pain.  Some nausea.  Objective: Vital signs in last 24 hours: Temp:  [97.6 F (36.4 C)-98.5 F (36.9 C)] 97.9 F (36.6 C) (09/05 0500) Pulse Rate:  [69-97] 95 (09/05 0500) Resp:  [7-19] 18 (09/05 0500) BP: (158-196)/(74-97) 165/86 mmHg (09/05 0500) SpO2:  [99 %-100 %] 100 % (09/05 0500) Weight:  [147 lb (66.679 kg)] 147 lb (66.679 kg) (09/04 0858) Last BM Date: 12/03/13  Intake/Output from previous day: 09/04 0701 - 09/05 0700 In: 5650 [I.V.:5650] Out: 1590 [Urine:1400; Drains:90; Blood:100] Intake/Output this shift:    PE: General- In NAD Abdomen-soft, dressing dry, thin bloody drain output  Lab Results:   Recent Labs  12/04/13 2032 12/05/13 0545  WBC 17.2* 17.2*  HGB 12.7 12.5  HCT 37.1 36.4  PLT 200 210   BMET  Recent Labs  12/04/13 2032 12/05/13 0545  NA  --  131*  K  --  3.9  CL  --  95*  CO2  --  23  GLUCOSE  --  194*  BUN  --  9  CREATININE 0.61 0.63  CALCIUM  --  8.0*   PT/INR No results found for this basename: LABPROT, INR,  in the last 72 hours Comprehensive Metabolic Panel:    Component Value Date/Time   NA 131* 12/05/2013 0545   NA 136* 12/01/2013 1015   K 3.9 12/05/2013 0545   K 4.3 12/01/2013 1015   CL 95* 12/05/2013 0545   CL 98 12/01/2013 1015   CO2 23 12/05/2013 0545   CO2 25 12/01/2013 1015   BUN 9 12/05/2013 0545   BUN 19 12/01/2013 1015   CREATININE 0.63 12/05/2013 0545   CREATININE 0.61 12/04/2013 2032   GLUCOSE 194* 12/05/2013 0545   GLUCOSE 98 12/01/2013 1015   CALCIUM 8.0* 12/05/2013 0545   CALCIUM 9.1 12/01/2013 1015   CALCIUM 8.7 11/05/2011 0734   AST 20 09/22/2013 1350   AST 20 09/05/2012 0731   ALT 15 09/22/2013 1350   ALT 20 09/05/2012 0731   ALKPHOS 100 09/22/2013 1350   ALKPHOS 105 09/05/2012 0731   BILITOT 0.6 09/22/2013 1350   BILITOT 0.7 09/05/2012 0731   PROT 6.6 09/22/2013 1350   PROT 6.8 09/05/2012 0731   ALBUMIN 4.1 09/22/2013 1350   ALBUMIN 4.2 09/05/2012 0731     Studies/Results: No results  found.  Anti-infectives: Anti-infectives   Start     Dose/Rate Route Frequency Ordered Stop   12/04/13 2200  cefoTEtan (CEFOTAN) 2 g in dextrose 5 % 50 mL IVPB     2 g 100 mL/hr over 30 Minutes Intravenous Every 12 hours 12/04/13 1847 12/04/13 2233   12/04/13 0855  cefoTEtan (CEFOTAN) 2 g in dextrose 5 % 50 mL IVPB     2 g 100 mL/hr over 30 Minutes Intravenous On call to O.R. 12/04/13 0855 12/04/13 1233   12/04/13 0855  neomycin (MYCIFRADIN) tablet 1,000 mg  Status:  Discontinued     1,000 mg Oral 3 times per day 12/04/13 0855 12/04/13 0907   12/04/13 0855  erythromycin (E-MYCIN) tablet 1,000 mg  Status:  Discontinued     1,000 mg Oral 3 times per day 12/04/13 0855 12/04/13 0907      Assessment Active Problems:   Status post colostomy closure 12/05/13-poor pain control    HTN-BP elevated likely secondary to pain.    LOS: 1 day   Plan: Start PCA, OOB, prn Labetolol.   Tammy Duke 12/05/2013

## 2013-12-05 NOTE — Progress Notes (Signed)
Paged Dr Zella Richer about the pt's elevated Bp's. No orders given at this time, MD will assess Pt during morning rounds. Jeanie Sewer, RN 6:17 AM 12/05/2013

## 2013-12-06 MED ORDER — LOSARTAN POTASSIUM 50 MG PO TABS
50.0000 mg | ORAL_TABLET | Freq: Every day | ORAL | Status: DC
  Administered 2013-12-06 – 2013-12-14 (×9): 50 mg via ORAL
  Filled 2013-12-06 (×10): qty 1

## 2013-12-06 MED ORDER — LEVOTHYROXINE SODIUM 100 MCG PO TABS
100.0000 ug | ORAL_TABLET | Freq: Every day | ORAL | Status: DC
  Administered 2013-12-07 – 2013-12-14 (×8): 100 ug via ORAL
  Filled 2013-12-06 (×9): qty 1

## 2013-12-06 NOTE — Progress Notes (Signed)
2 Days Post-Op  Subjective: Pain under much better control.  Nausea is better.  Objective: Vital signs in last 24 hours: Temp:  [98.2 F (36.8 C)-99.4 F (37.4 C)] 98.9 F (37.2 C) (09/06 0650) Pulse Rate:  [82-98] 82 (09/06 0650) Resp:  [14-21] 15 (09/06 0650) BP: (140-168)/(74-86) 160/86 mmHg (09/06 0650) SpO2:  [99 %-100 %] 99 % (09/06 0650) Last BM Date: 12/03/13  Intake/Output from previous day: 09/05 0701 - 09/06 0700 In: 2536.7 [P.O.:180; I.V.:2356.7] Out: 2990 [Urine:2950; Drains:40] Intake/Output this shift:    PE: General- In NAD Abdomen-soft, dressing dry, thin serous drain output, few bowel sounds  Lab Results:   Recent Labs  12/04/13 2032 12/05/13 0545  WBC 17.2* 17.2*  HGB 12.7 12.5  HCT 37.1 36.4  PLT 200 210   BMET  Recent Labs  12/04/13 2032 12/05/13 0545  NA  --  131*  K  --  3.9  CL  --  95*  CO2  --  23  GLUCOSE  --  194*  BUN  --  9  CREATININE 0.61 0.63  CALCIUM  --  8.0*   PT/INR No results found for this basename: LABPROT, INR,  in the last 72 hours Comprehensive Metabolic Panel:    Component Value Date/Time   NA 131* 12/05/2013 0545   NA 136* 12/01/2013 1015   K 3.9 12/05/2013 0545   K 4.3 12/01/2013 1015   CL 95* 12/05/2013 0545   CL 98 12/01/2013 1015   CO2 23 12/05/2013 0545   CO2 25 12/01/2013 1015   BUN 9 12/05/2013 0545   BUN 19 12/01/2013 1015   CREATININE 0.63 12/05/2013 0545   CREATININE 0.61 12/04/2013 2032   GLUCOSE 194* 12/05/2013 0545   GLUCOSE 98 12/01/2013 1015   CALCIUM 8.0* 12/05/2013 0545   CALCIUM 9.1 12/01/2013 1015   CALCIUM 8.7 11/05/2011 0734   AST 20 09/22/2013 1350   AST 20 09/05/2012 0731   ALT 15 09/22/2013 1350   ALT 20 09/05/2012 0731   ALKPHOS 100 09/22/2013 1350   ALKPHOS 105 09/05/2012 0731   BILITOT 0.6 09/22/2013 1350   BILITOT 0.7 09/05/2012 0731   PROT 6.6 09/22/2013 1350   PROT 6.8 09/05/2012 0731   ALBUMIN 4.1 09/22/2013 1350   ALBUMIN 4.2 09/05/2012 0731     Studies/Results: No results  found.  Anti-infectives: Anti-infectives   Start     Dose/Rate Route Frequency Ordered Stop   12/04/13 2200  cefoTEtan (CEFOTAN) 2 g in dextrose 5 % 50 mL IVPB     2 g 100 mL/hr over 30 Minutes Intravenous Every 12 hours 12/04/13 1847 12/04/13 2233   12/04/13 0855  cefoTEtan (CEFOTAN) 2 g in dextrose 5 % 50 mL IVPB     2 g 100 mL/hr over 30 Minutes Intravenous On call to O.R. 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    Seizure disorder-on Tegretol    LOS: 2 days   Plan: IS, clear liquids, restart Cozaar.   Cecil Bixby J 12/06/2013

## 2013-12-07 LAB — URINALYSIS, ROUTINE W REFLEX MICROSCOPIC
Bilirubin Urine: NEGATIVE
Glucose, UA: 250 mg/dL — AB
Ketones, ur: NEGATIVE mg/dL
Leukocytes, UA: NEGATIVE
Nitrite: NEGATIVE
PROTEIN: NEGATIVE mg/dL
Specific Gravity, Urine: 1.006 (ref 1.005–1.030)
Urobilinogen, UA: 0.2 mg/dL (ref 0.0–1.0)
pH: 7.5 (ref 5.0–8.0)

## 2013-12-07 LAB — URINE MICROSCOPIC-ADD ON

## 2013-12-07 MED ORDER — CLONIDINE HCL 0.1 MG/24HR TD PTWK
0.1000 mg | MEDICATED_PATCH | TRANSDERMAL | Status: DC
  Administered 2013-12-07 – 2013-12-14 (×2): 0.1 mg via TRANSDERMAL
  Filled 2013-12-07 (×2): qty 1

## 2013-12-07 MED ORDER — PANTOPRAZOLE SODIUM 40 MG PO TBEC
40.0000 mg | DELAYED_RELEASE_TABLET | Freq: Every day | ORAL | Status: DC
  Administered 2013-12-07 – 2013-12-13 (×7): 40 mg via ORAL
  Filled 2013-12-07 (×9): qty 1

## 2013-12-07 NOTE — Progress Notes (Signed)
3 Days Post-Op  Subjective: Tolerating clear liquids.  No flatus or BM.  Objective: Vital signs in last 24 hours: Temp:  [97.6 F (36.4 C)-99.1 F (37.3 C)] 98 F (36.7 C) (09/07 0700) Pulse Rate:  [86-112] 90 (09/07 0700) Resp:  [10-20] 18 (09/07 0802) BP: (160-179)/(75-88) 179/81 mmHg (09/07 0700) SpO2:  [97 %-100 %] 99 % (09/07 0802) Last BM Date: 12/03/13 (bowel prep for sx)  Intake/Output from previous day: 09/06 0701 - 09/07 0700 In: 3420 [P.O.:1020; I.V.:2400] Out: 2615 [Urine:2575; Drains:40] Intake/Output this shift:    PE: General- In NAD Abdomen-soft, dressing dry, thin serous drain output, few bowel sounds, wounds open and clean with DPC sutures in place.  Lab Results:   Recent Labs  12/04/13 2032 12/05/13 0545  WBC 17.2* 17.2*  HGB 12.7 12.5  HCT 37.1 36.4  PLT 200 210   BMET  Recent Labs  12/04/13 2032 12/05/13 0545  NA  --  131*  K  --  3.9  CL  --  95*  CO2  --  23  GLUCOSE  --  194*  BUN  --  9  CREATININE 0.61 0.63  CALCIUM  --  8.0*   PT/INR No results found for this basename: LABPROT, INR,  in the last 72 hours Comprehensive Metabolic Panel:    Component Value Date/Time   NA 131* 12/05/2013 0545   NA 136* 12/01/2013 1015   K 3.9 12/05/2013 0545   K 4.3 12/01/2013 1015   CL 95* 12/05/2013 0545   CL 98 12/01/2013 1015   CO2 23 12/05/2013 0545   CO2 25 12/01/2013 1015   BUN 9 12/05/2013 0545   BUN 19 12/01/2013 1015   CREATININE 0.63 12/05/2013 0545   CREATININE 0.61 12/04/2013 2032   GLUCOSE 194* 12/05/2013 0545   GLUCOSE 98 12/01/2013 1015   CALCIUM 8.0* 12/05/2013 0545   CALCIUM 9.1 12/01/2013 1015   CALCIUM 8.7 11/05/2011 0734   AST 20 09/22/2013 1350   AST 20 09/05/2012 0731   ALT 15 09/22/2013 1350   ALT 20 09/05/2012 0731   ALKPHOS 100 09/22/2013 1350   ALKPHOS 105 09/05/2012 0731   BILITOT 0.6 09/22/2013 1350   BILITOT 0.7 09/05/2012 0731   PROT 6.6 09/22/2013 1350   PROT 6.8 09/05/2012 0731   ALBUMIN 4.1 09/22/2013 1350   ALBUMIN 4.2 09/05/2012 0731      Studies/Results: No results found.  Anti-infectives: Anti-infectives   Start     Dose/Rate Route Frequency Ordered Stop   12/04/13 2200  cefoTEtan (CEFOTAN) 2 g in dextrose 5 % 50 mL IVPB     2 g 100 mL/hr over 30 Minutes Intravenous Every 12 hours 12/04/13 1847 12/04/13 2233   12/04/13 0855  cefoTEtan (CEFOTAN) 2 g in dextrose 5 % 50 mL IVPB     2 g 100 mL/hr over 30 Minutes Intravenous On call to O.R. 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  Catapres patch.   Tammy Duke 12/07/2013

## 2013-12-07 NOTE — Progress Notes (Signed)

## 2013-12-08 ENCOUNTER — Encounter (HOSPITAL_COMMUNITY): Payer: Self-pay | Admitting: Surgery

## 2013-12-08 NOTE — Progress Notes (Signed)
Patient ID: Tammy Duke, female   DOB: Sep 26, 1940, 73 y.o.   MRN: [id number] [facility] Progress Note:   4 Days Post-Op  Subjective: Mental status is clear Objective: Vital signs in last 24 hours: Temp:  [97.7 F (36.5 C)-98.9 F (37.2 C)] 97.9 F (36.6 C) (09/08 0610) Pulse Rate:  [79-94] 89 (09/08 0610) Resp:  [14-18] 14 (09/08 0820) BP: (141-184)/(61-97) 177/73 mmHg (09/08 0610) SpO2:  [98 %-100 %] 99 % (09/08 0820)  Intake/Output from previous day: 09/07 0701 - 09/08 0700 In: 3860 [P.O.:1560; I.V.:2300] Out: 5485 [Urine:5475; Drains:10] Intake/Output this shift: Total I/O In: -  Out: 300 [Urine:300]  Physical Exam: Work of breathing is normal.  Delayed primary closure brought together.  Having urinary urgency.  Suspect it may be from JP drain-removed.  On full liquids.  Would not advance.  UA negative.    Lab Results:  Results for orders placed during the hospital encounter of 12/04/13 (from the past 48 hour(s))  URINALYSIS, ROUTINE W REFLEX MICROSCOPIC     Status: Abnormal   Collection Time    12/07/13  8:09 PM      Result Value Ref Range   Color, Urine YELLOW  YELLOW   APPearance CLEAR  CLEAR   Specific Gravity, Urine 1.006  1.005 - 1.030   pH 7.5  5.0 - 8.0   Glucose, UA 250 (*) NEGATIVE mg/dL   Hgb urine dipstick SMALL (*) NEGATIVE   Bilirubin Urine NEGATIVE  NEGATIVE   Ketones, ur NEGATIVE  NEGATIVE mg/dL   Protein, ur NEGATIVE  NEGATIVE mg/dL   Urobilinogen, UA 0.2  0.0 - 1.0 mg/dL   Nitrite NEGATIVE  NEGATIVE   Leukocytes, UA NEGATIVE  NEGATIVE  URINE MICROSCOPIC-ADD ON     Status: None   Collection Time    12/07/13  8:09 PM      Result Value Ref Range   RBC / HPF 0-2  <3 RBC/hpf    Radiology/Results: No results found.  Anti-infectives: Anti-infectives   Start     Dose/Rate Route Frequency Ordered Stop   12/04/13 2200  cefoTEtan (CEFOTAN) 2 g in dextrose 5 % 50 mL IVPB     2 g 100 mL/hr over 30 Minutes Intravenous Every 12 hours  12/04/13 1847 12/04/13 2233   12/04/13 0855  cefoTEtan (CEFOTAN) 2 g in dextrose 5 % 50 mL IVPB     2 g 100 mL/hr over 30 Minutes Intravenous On call to O.R. 12/04/13 0855 12/04/13 1233   12/04/13 0855  neomycin (MYCIFRADIN) tablet 1,000 mg  Status:  Discontinued     1,000 mg Oral 3 times per day 12/04/13 0855 12/04/13 0907   12/04/13 0855  erythromycin (E-MYCIN) tablet 1,000 mg  Status:  Discontinued     1,000 mg Oral 3 times per day 12/04/13 0855 12/04/13 0907      Assessment/Plan: Problem List: Patient Active Problem List   Diagnosis Date Noted  . Status post colostomy closure 12/05/13 12/05/2013  . S/P colectomy 12/04/2013  . Back pain 09/22/2013  . 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 Degenerative arthritis of right knee 09/24/2012  . Urinary tract infection, site not specified 01/21/2012  . Osteoporosis 10/28/2011  . Menopausal syndrome 09/06/2011  . Preventative health care 08/13/2010  . SYNCOPE 01/02/2010  . GOITER, MULTINODULAR 05/04/2009  . HYPOTHYROIDISM 04/21/2009  . HYPERLIPIDEMIA 04/21/2009  . HYPERTENSION 04/21/2009  . ALLERGIC RHINITIS 04/21/2009  . SEIZURE DISORDER 04/21/2009  No flatus yet..doing well  4 Days Post-Op    LOS: 4 days   Matt B. Hassell Done, MD, [facility] [phone] beeper [phone]  12/08/2013 9:18 AM

## 2013-12-08 NOTE — Care Management Note (Signed)
    Page 1 of 1   12/10/2013     11:23:53 AM CARE MANAGEMENT NOTE 12/10/2013  Patient:  Tammy Duke, Tammy Duke   Account Number:  [id number]  Date Initiated:  12/08/2013  Documentation initiated by:  Sunday Spillers  Subjective/Objective Assessment:   73 yo female admitted s/p colostomy closure. PTA lived at home alone.     Action/Plan:   Home when stable   Anticipated DC Date:  12/11/2013   Anticipated DC Plan:  [place]  CM consult      [facility] Choice  HOME HEALTH   Choice offered to / List presented to:  C-1 Patient           [place].   Status of service:  In process, will continue to follow Medicare Important Message given?  YES (If response is "NO", the following Medicare IM given date fields will be blank) Date Medicare IM given:  12/10/2013 Medicare IM given by:  [facility] Date Additional Medicare IM given:   Additional Medicare IM given by:    Discharge Disposition:    Per UR Regulation:  Reviewed for med. necessity/level of care/duration of stay  If discussed at [place] of Stay Meetings, dates discussed:   12/10/2013    Comments:  12/10/13 Ysabel Cowgill RN,BSN NCM [address] IF NEEDED FOR FURTHER INSTRUCTION ON WOUND CARE.TC KRISTEN [facility] REP AWARE OF REFERRAL.AWAIT FINAL HHRN ORDER.  12-08-13 Sunday Spillers RN CM 1200 Spoke with patient at bedside, per RN might need wound care. Patient not sure if she will need assistance or not. Has use [facility] in the past so if needs [facility] services will use them. Awaiting final orders.

## 2013-12-08 NOTE — Progress Notes (Signed)
Urinary urgency throughout shift. Ua negative

## 2013-12-09 MED ORDER — OXYCODONE-ACETAMINOPHEN 5-325 MG PO TABS
1.0000 | ORAL_TABLET | ORAL | Status: DC | PRN
  Administered 2013-12-09 (×2): 1 via ORAL
  Administered 2013-12-09: 2 via ORAL
  Administered 2013-12-09: 1 via ORAL
  Administered 2013-12-10 – 2013-12-14 (×20): 2 via ORAL
  Filled 2013-12-09: qty 1
  Filled 2013-12-09 (×5): qty 2
  Filled 2013-12-09: qty 1
  Filled 2013-12-09 (×9): qty 2
  Filled 2013-12-09 (×3): qty 1
  Filled 2013-12-09 (×6): qty 2

## 2013-12-09 NOTE — Progress Notes (Signed)
5 Days Post-Op  Subjective: Pt doing well.  No flatus, mucus BMs  Objective: Vital signs in last 24 hours: Temp:  [97.5 F (36.4 C)-98.7 F (37.1 C)] 98.4 F (36.9 C) (09/09 1051) Pulse Rate:  [82-95] 82 (09/09 1051) Resp:  [10-19] 12 (09/09 0824) BP: (156-180)/(80-105) 156/80 mmHg (09/09 1051) SpO2:  [91 %-100 %] 98 % (09/09 1051) Last BM Date: 12/08/13  Intake/Output from previous day: 09/08 0701 - 09/09 0700 In: 2526.5 [I.V.:2526.5] Out: 2950 [Urine:2950] Intake/Output this shift: Total I/O In: -  Out: 300 [Urine:300]  General appearance: alert and cooperative Cardio: regular rate and rhythm, S1, S2 normal, no murmur, click, rub or gallop GI: soft, approp ttp, nd, active BS, wound c/d/i   Anti-infectives: Anti-infectives   Start     Dose/Rate Route Frequency Ordered Stop   12/04/13 2200  cefoTEtan (CEFOTAN) 2 g in dextrose 5 % 50 mL IVPB     2 g 100 mL/hr over 30 Minutes Intravenous Every 12 hours 12/04/13 1847 12/04/13 2233   12/04/13 0855  cefoTEtan (CEFOTAN) 2 g in dextrose 5 % 50 mL IVPB     2 g 100 mL/hr over 30 Minutes Intravenous On call to O.R. 12/04/13 0855 12/04/13 1233   12/04/13 0855  neomycin (MYCIFRADIN) tablet 1,000 mg  Status:  Discontinued     1,000 mg Oral 3 times per day 12/04/13 0855 12/04/13 0907   12/04/13 0855  erythromycin (E-MYCIN) tablet 1,000 mg  Status:  Discontinued     1,000 mg Oral 3 times per day 12/04/13 0855 12/04/13 0907      Assessment/Plan: s/p Procedure(s): COLOSTOMY TAKEDOWN (HARTMAN)/takedown of Hartman pouch and end colostomy (N/A) Con't with FLD DC PCA PO percocet Con't to ambulate Will decrease IVF 75cc/hr   LOS: 5 days    Tammy Jacks., Tammy Duke 12/09/2013

## 2013-12-10 NOTE — Evaluation (Signed)
Physical Therapy Evaluation Patient Details Name: Tammy Duke MRN: [id number] DOB: [birth year] Today's Date: 12/10/2013   History of Present Illness  Pt is a 73 year old female s/p colostomy takedown of Hartman pouch and end colostomy on 12/04/13  Clinical Impression  Pt currently with functional limitations due to the deficits listed below (see PT Problem List).  Pt will benefit from skilled PT to increase their independence and safety with mobility to allow discharge to the venue listed below. Pt able to tolerate good distance ambulation today and reports plan to increase distance next time.  Pt mobilizing well POD #6 however reports some weakness.  Pt may benefit from HHPT however could progress to no needs.     Follow Up Recommendations Home health PT (however may progress to no needs)    Equipment Recommendations  None recommended by PT    Recommendations for Other Services       Precautions / Restrictions Precautions Precautions: Fall      Mobility  Bed Mobility Overal bed mobility: Needs Assistance Bed Mobility: Supine to Sit     Supine to sit: Min assist     General bed mobility comments: slight assist for trunk, discussed log roll technique and/or hugging pillow for abdominal comfort  Transfers Overall transfer level: Needs assistance Equipment used: Rolling walker (2 wheeled) Transfers: Sit to/from Stand Sit to Stand: Min guard         General transfer comment: verbal cue for hand placement  Ambulation/Gait Ambulation/Gait assistance: Min guard Ambulation Distance (Feet): 260 Feet Assistive device: Rolling walker (2 wheeled) Gait Pattern/deviations: Step-through pattern;Trunk flexed Gait velocity: decr   General Gait Details: verbal cues for RW distance and posture, tends to remain in increased flexion posture due to abdominal incision   Stairs            Wheelchair Mobility    Modified Rankin (Stroke Patients Only)       Balance                                              Pertinent Vitals/Pain Pain Assessment: 0-10 Pain Score: 4  Pain Location: abdomen Pain Intervention(s): Limited activity within patient's tolerance;Monitored during session;RN gave pain meds during session;Repositioned    Home Living Family/patient expects to be discharged to:: Private residence Living Arrangements: Alone   Type of Home: House Home Access: Stairs to enter Entrance Stairs-Rails: Right Entrance Stairs-Number of Steps: 5 Home Layout: One level Home Equipment: Environmental consultant - 2 wheels;Bedside commode      Prior Function Level of Independence: Independent with assistive device(s)         Comments: reports typically independent around home, occasional use of RW     Hand Dominance        Extremity/Trunk Assessment               Lower Extremity Assessment: Generalized weakness         Communication   Communication: No difficulties  Cognition Arousal/Alertness: Awake/alert Behavior During Therapy: WFL for tasks assessed/performed Overall Cognitive Status: Within Functional Limits for tasks assessed                      General Comments      Exercises        Assessment/Plan    PT Assessment Patient needs continued PT services  PT Diagnosis  Difficulty walking;Generalized weakness   PT Problem List Decreased strength;Decreased activity tolerance;Decreased mobility;Pain  PT Treatment Interventions DME instruction;Gait training;Functional mobility training;Therapeutic activities;Therapeutic exercise;Stair training;Patient/family education   PT Goals (Current goals can be found in the Care Plan section) Acute Rehab PT Goals PT Goal Formulation: With patient Time For Goal Achievement: 12/17/13 Potential to Achieve Goals: Good    Frequency Min 3X/week   Barriers to discharge        Co-evaluation               End of Session   Activity Tolerance: Patient tolerated  treatment well Patient left: in chair;with call bell/phone within reach Nurse Communication: Mobility status;Patient requests pain meds         Time: 1229-1249 PT Time Calculation (min): 20 min   Charges:   PT Evaluation $Initial PT Evaluation Tier I: 1 Procedure PT Treatments $Gait Training: 8-22 mins   PT G Codes:          Ayan Yankey,KATHrine E 12/10/2013, 2:06 PM Carmelia Bake, PT, DPT 12/10/2013 Pager: [phone]

## 2013-12-10 NOTE — Progress Notes (Signed)
Patient ID: Tammy Duke, female   DOB: 09/14/1940, 73 y.o.   MRN: [id number] [facility] Progress Note:   6 Days Post-Op  Subjective: Mental status is clear.  Urgency is better.  No complaints except weakness Objective: Vital signs in last 24 hours: Temp:  [97.9 F (36.6 C)-99 F (37.2 C)] 97.9 F (36.6 C) (09/10 0554) Pulse Rate:  [82-88] 83 (09/10 0554) Resp:  [12-18] 18 (09/10 0554) BP: (156-167)/(78-96) 162/83 mmHg (09/10 0554) SpO2:  [91 %-100 %] 100 % (09/10 0554)  Intake/Output from previous day: 09/09 0701 - 09/10 0700 In: 3613.3 [P.O.:1680; I.V.:1933.3] Out: 3250 [Urine:3250] Intake/Output this shift:    Physical Exam: Work of breathing is normal.  Passing BMs  Lab Results:  No results found for this or any previous visit (from the past 48 hour(s)).  Radiology/Results: No results found.  Anti-infectives: Anti-infectives   Start     Dose/Rate Route Frequency Ordered Stop   12/04/13 2200  cefoTEtan (CEFOTAN) 2 g in dextrose 5 % 50 mL IVPB     2 g 100 mL/hr over 30 Minutes Intravenous Every 12 hours 12/04/13 1847 12/04/13 2233   12/04/13 0855  cefoTEtan (CEFOTAN) 2 g in dextrose 5 % 50 mL IVPB     2 g 100 mL/hr over 30 Minutes Intravenous On call to O.R. 12/04/13 0855 12/04/13 1233   12/04/13 0855  neomycin (MYCIFRADIN) tablet 1,000 mg  Status:  Discontinued     1,000 mg Oral 3 times per day 12/04/13 0855 12/04/13 0907   12/04/13 0855  erythromycin (E-MYCIN) tablet 1,000 mg  Status:  Discontinued     1,000 mg Oral 3 times per day 12/04/13 0855 12/04/13 0907      Assessment/Plan: Problem List: Patient Active Problem List   Diagnosis Date Noted  . Status post colostomy closure 12/05/13 12/05/2013  . S/P colectomy 12/04/2013  . Back pain 09/22/2013  . 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 Degenerative arthritis of right knee 09/24/2012  . Urinary tract infection, site not specified 01/21/2012  .  Osteoporosis 10/28/2011  . Menopausal syndrome 09/06/2011  . Preventative health care 08/13/2010  . SYNCOPE 01/02/2010  . GOITER, MULTINODULAR 05/04/2009  . HYPOTHYROIDISM 04/21/2009  . HYPERLIPIDEMIA 04/21/2009  . HYPERTENSION 04/21/2009  . ALLERGIC RHINITIS 04/21/2009  . SEIZURE DISORDER 04/21/2009    Not ready for discharge yet.   6 Days Post-Op    LOS: 6 days   Matt B. Hassell Done, MD, [facility] [phone] beeper [phone]  12/10/2013 7:48 AM

## 2013-12-11 MED ORDER — ALUM & MAG HYDROXIDE-SIMETH 200-200-20 MG/5ML PO SUSP
30.0000 mL | Freq: Four times a day (QID) | ORAL | Status: DC | PRN
  Administered 2013-12-11 – 2013-12-14 (×3): 30 mL via ORAL
  Filled 2013-12-11 (×3): qty 30

## 2013-12-11 NOTE — Progress Notes (Signed)
Physical Therapy Treatment Patient Details Name: Tammy Duke MRN: [id number] DOB: 1940-06-25 Today's Date: 01-Jan-2014    History of Present Illness Pt is a 73 year old female s/p colostomy takedown of Hartman pouch and end colostomy on 12/04/13    PT Comments    Pt ambulated in hallway and assisted to recliner.  Pt declined exercises this visit due to fatigue and pain.  Follow Up Recommendations  Home health PT     Equipment Recommendations  None recommended by PT    Recommendations for Other Services       Precautions / Restrictions Precautions Precautions: Fall Restrictions Weight Bearing Restrictions: No    Mobility  Bed Mobility Overal bed mobility: Needs Assistance Bed Mobility: Supine to Sit     Supine to sit: Supervision        Transfers Overall transfer level: Needs assistance Equipment used: Rolling walker (2 wheeled) Transfers: Sit to/from Stand Sit to Stand: Min guard         General transfer comment: verbal cue for hand placement  Ambulation/Gait Ambulation/Gait assistance: Min guard Ambulation Distance (Feet): 200 Feet Assistive device: Rolling walker (2 wheeled) Gait Pattern/deviations: Step-through pattern;Trunk flexed Gait velocity: decr   General Gait Details: verbal cues for RW distance and posture, continues to remain in increased flexion posture due to abdominal incision    Stairs            Wheelchair Mobility    Modified Rankin (Stroke Patients Only)       Balance                                    Cognition Arousal/Alertness: Awake/alert Behavior During Therapy: WFL for tasks assessed/performed Overall Cognitive Status: Within Functional Limits for tasks assessed                      Exercises      General Comments        Pertinent Vitals/Pain Pain Assessment: 0-10 Pain Score: 3  Pain Location: abdomen with transfers mostly Pain Descriptors / Indicators: Sore Pain  Intervention(s): Limited activity within patient's tolerance;Monitored during session;Repositioned    Home Living                      Prior Function            PT Goals (current goals can now be found in the care plan section) Progress towards PT goals: Progressing toward goals    Frequency  Min 3X/week    PT Plan Current plan remains appropriate    Co-evaluation             End of Session   Activity Tolerance: Patient tolerated treatment well Patient left: in chair;with call bell/phone within reach     Time: 0911-0924 PT Time Calculation (min): 13 min  Charges:  $Gait Training: 8-22 mins                    G Codes:      Kaeli Nichelson,KATHrine E 01-01-2014, 10:15 AM Carmelia Bake, PT, DPT Jan 01, 2014 Pager: [phone]

## 2013-12-12 NOTE — Progress Notes (Signed)
Patient ID: Tammy Duke, female   DOB: [birth year], 73 y.o.   MRN: [id number] [facility] Surgery Progress Note:   8 Days Post-Op  Subjective: Mental status is clear.  Trying to get up more and walk and take care of herself Objective: Vital signs in last 24 hours: Temp:  [97.9 F (36.6 C)-98.2 F (36.8 C)] 98.1 F (36.7 C) (09/12 0436) Pulse Rate:  [81-82] 81 (09/12 0436) Resp:  [16-18] 18 (09/12 0436) BP: (155-169)/(69-98) 169/98 mmHg (09/12 0436) SpO2:  [98 %-100 %] 100 % (09/12 0436)  Intake/Output from previous day: 09/11 0701 - 09/12 0700 In: 2010 [P.O.:480; I.V.:1530] Out: 2400 [Urine:2250; Stool:150] Intake/Output this shift: Total I/O In: 120 [P.O.:120] Out: -   Physical Exam: Work of breathing is normal.  Incisions ok.  Passing flatus.    Lab Results:  No results found for this or any previous visit (from the past 48 hour(s)).  Radiology/Results: No results found.  Anti-infectives: Anti-infectives   Start     Dose/Rate Route Frequency Ordered Stop   12/04/13 2200  cefoTEtan (CEFOTAN) 2 g in dextrose 5 % 50 mL IVPB     2 g 100 mL/hr over 30 Minutes Intravenous Every 12 hours 12/04/13 1847 12/04/13 2233   12/04/13 0855  cefoTEtan (CEFOTAN) 2 g in dextrose 5 % 50 mL IVPB     2 g 100 mL/hr over 30 Minutes Intravenous On call to O.R. 12/04/13 0855 12/04/13 1233   12/04/13 0855  neomycin (MYCIFRADIN) tablet 1,000 mg  Status:  Discontinued     1,000 mg Oral 3 times per day 12/04/13 0855 12/04/13 0907   12/04/13 0855  erythromycin (E-MYCIN) tablet 1,000 mg  Status:  Discontinued     1,000 mg Oral 3 times per day 12/04/13 0855 12/04/13 0907      Assessment/Plan: Problem List: Patient Active Problem List   Diagnosis Date Noted  . Status post colostomy closure 12/05/13 12/05/2013  . S/P colectomy 12/04/2013  . Back pain 09/22/2013  . 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 Degenerative arthritis of right knee  09/24/2012  . Urinary tract infection, site not specified 01/21/2012  . Osteoporosis 10/28/2011  . Menopausal syndrome 09/06/2011  . Preventative health care 08/13/2010  . SYNCOPE 01/02/2010  . GOITER, MULTINODULAR 05/04/2009  . HYPOTHYROIDISM 04/21/2009  . HYPERLIPIDEMIA 04/21/2009  . HYPERTENSION 04/21/2009  . ALLERGIC RHINITIS 04/21/2009  . SEIZURE DISORDER 04/21/2009    Hopeful discharge in a couple of days.  Working on recovering mobility and self care.  8 Days Post-Op    LOS: 8 days   Matt B. Hassell Done, MD, [facility] [phone] beeper [phone]  12/12/2013 9:51 AM

## 2013-12-13 MED ORDER — HYDROCORTISONE 1 % EX CREA
TOPICAL_CREAM | Freq: Two times a day (BID) | CUTANEOUS | Status: DC | PRN
  Administered 2013-12-13: 1 via TOPICAL
  Filled 2013-12-13: qty 28

## 2013-12-13 NOTE — Progress Notes (Deleted)
NCM spoke to pt and gave permission to speak to wife, [place] Shi. Wife requested [facility] for [facility]. Requested RW for home. Notified [facility] reps for [facility] and DME needed for scheduled dc home today. Jonnie Finner RN CCM Case Mgmt phone [phone]

## 2013-12-13 NOTE — Progress Notes (Signed)
CARE MANAGEMENT NOTE 12/13/2013  Patient:  Tammy Duke, Tammy Duke   Account Number:  [id number]  Date Initiated:  12/08/2013  Documentation initiated by:  Sunday Spillers  Subjective/Objective Assessment:   73 yo female admitted s/p colostomy closure. PTA lived at home alone.     Action/Plan:   Home when stable   Anticipated DC Date:  12/11/2013   Anticipated DC Plan:  [place]  CM consult      [facility] Choice  HOME HEALTH   Choice offered to / List presented to:  C-1 Patient        [place] arranged  HH-1 RN      [place].   Status of service:  Completed, signed off Medicare Important Message given?  YES (If response is "NO", the following Medicare IM given date fields will be blank) Date Medicare IM given:  12/10/2013 Medicare IM given by:  [facility] Date Additional Medicare IM given:  12/13/2013 Additional Medicare IM given by:  Jonnie Finner  Discharge Disposition:  [place]  Per UR Regulation:  Reviewed for med. necessity/level of care/duration of stay  If discussed at [place] of Stay Meetings, dates discussed:   12/10/2013    Comments:  12/13/2013 1200 [facility] aware of referral for scheduled dc home tomorrow with [facility] RN. Jonnie Finner RN CCM Case Mgmt phone [phone]  12/10/13 KATHY MAHABIR RN,BSN NCM [address] IF NEEDED FOR FURTHER INSTRUCTION ON WOUND CARE.TC KRISTEN [facility] REP AWARE OF REFERRAL.AWAIT FINAL HHRN ORDER.  12-08-13 Sunday Spillers RN CM 1200 Spoke with patient at bedside, per RN might need wound care. Patient not sure if she will need assistance or not. Has use [facility] in the past so if needs [facility] services will use them. Awaiting final orders.

## 2013-12-13 NOTE — Progress Notes (Signed)
Patient ID: Tammy Duke, female   DOB: [birth year], 73 y.o.   MRN: [id number] [facility] Surgery Progress Note:   9 Days Post-Op  Subjective: Mental status is clear.  Getting better.  Will need [facility] for wound care post discharge Objective: Vital signs in last 24 hours: Temp:  [98.2 F (36.8 C)-99.1 F (37.3 C)] 99.1 F (37.3 C) (09/13 0516) Pulse Rate:  [74-89] 74 (09/13 0647) Resp:  [16-18] 18 (09/13 0516) BP: (159-192)/(69-93) 159/77 mmHg (09/13 0647) SpO2:  [99 %-100 %] 100 % (09/13 0516)  Intake/Output from previous day: 09/12 0701 - 09/13 0700 In: 2430 [P.O.:360; I.V.:2070] Out: 2051 [Urine:2050; Stool:1] Intake/Output this shift: Total I/O In: -  Out: 300 [Urine:300]  Physical Exam: Work of breathing is normal.  Incision covered.    Lab Results:  No results found for this or any previous visit (from the past 48 hour(s)).  Radiology/Results: No results found.  Anti-infectives: Anti-infectives   Start     Dose/Rate Route Frequency Ordered Stop   12/04/13 2200  cefoTEtan (CEFOTAN) 2 g in dextrose 5 % 50 mL IVPB     2 g 100 mL/hr over 30 Minutes Intravenous Every 12 hours 12/04/13 1847 12/04/13 2233   12/04/13 0855  cefoTEtan (CEFOTAN) 2 g in dextrose 5 % 50 mL IVPB     2 g 100 mL/hr over 30 Minutes Intravenous On call to O.R. 12/04/13 0855 12/04/13 1233   12/04/13 0855  neomycin (MYCIFRADIN) tablet 1,000 mg  Status:  Discontinued     1,000 mg Oral 3 times per day 12/04/13 0855 12/04/13 0907   12/04/13 0855  erythromycin (E-MYCIN) tablet 1,000 mg  Status:  Discontinued     1,000 mg Oral 3 times per day 12/04/13 0855 12/04/13 0907      Assessment/Plan: Problem List: Patient Active Problem List   Diagnosis Date Noted  . Status post colostomy closure 12/05/13 12/05/2013  . S/P colectomy 12/04/2013  . Back pain 09/22/2013  . 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 Degenerative arthritis of right knee  09/24/2012  . Urinary tract infection, site not specified 01/21/2012  . Osteoporosis 10/28/2011  . Menopausal syndrome 09/06/2011  . Preventative health care 08/13/2010  . SYNCOPE 01/02/2010  . GOITER, MULTINODULAR 05/04/2009  . HYPOTHYROIDISM 04/21/2009  . HYPERLIPIDEMIA 04/21/2009  . HYPERTENSION 04/21/2009  . ALLERGIC RHINITIS 04/21/2009  . SEIZURE DISORDER 04/21/2009    Doing well thus far.  Plan discharge tomorrow.  9 Days Post-Op    LOS: 9 days   Matt B. Hassell Done, MD, [facility] [phone] beeper [phone]  12/13/2013 9:09 AM

## 2013-12-14 MED ORDER — OXYCODONE-ACETAMINOPHEN 5-325 MG PO TABS: 1.0000 | ORAL_TABLET | ORAL | Status: DC | PRN

## 2013-12-14 NOTE — Progress Notes (Signed)
Nurse reviewed discharge instructions with pt. Pt verbalized understanding of new medications, discharge instructions and follow up appointments.  Midline incision dressing changed prior to discharge.  No concerns at time of discharge.

## 2013-12-14 NOTE — Discharge Summary (Signed)
Physician Discharge Summary  Patient ID: Tammy Duke MRN: [id number] DOB/AGE: 12-26-1940 73 y.o.  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  Slow resolution of post op ileus.  Delayed primary closure of wound on PD 4.  Ready for discharge with [facility] to do daily wound dressing changes and assessments  Consults: none  Significant Diagnostic Studies: none    Discharge Exam: Blood pressure 172/75, pulse 84, temperature 98.9 F (37.2 C), temperature source Oral, resp. rate 18, height 5\' 11"  (1.803 m), weight 147 lb (66.679 kg), SpO2 100.00%. Incision has clean fat visible around where nylon sutures approximating the skin.  No erythema or infection.    Disposition: 03-Skilled Nursing Facility  Discharge Instructions   Discharge instructions    Complete by:  As directed   Stay on low fiber diet for 2 weeks and then gradually add fiber back in to your diet     Discharge wound care:    Complete by:  As directed   Daily saline wet-dry gauze dressing changes on your delayed primary closure     Increase activity slowly    Complete by:  As directed             Medication List         acetaminophen 500 MG tablet  Commonly known as:  TYLENOL  Take 1,000 mg by mouth every 6 (six) hours as needed for mild pain or moderate pain.     aspirin EC 81 MG tablet  Take 81 mg by mouth daily.     CALTRATE 600+D 600-400 MG-UNIT per tablet  Generic drug:  Calcium Carbonate-Vitamin D  Take 1 tablet by mouth 2 (two) times daily.     carbamazepine 200 MG tablet  Commonly known as:  TEGRETOL  Take 200-300 mg by mouth 4  (four) times daily. Takes 1.5 tablet every morning and at bedtime and 1 tablet at lunch and dinner     cyclobenzaprine 5 MG tablet  Commonly known as:  FLEXERIL  Take 5 mg by mouth 3 (three) times daily as needed for muscle spasms.     levothyroxine 100 MCG tablet  Commonly known as:  SYNTHROID, LEVOTHROID  Take 100 mcg by mouth daily before breakfast.     losartan 50 MG tablet  Commonly known as:  COZAAR  TAKE 1 TABLET BY MOUTH EVERY DAY     Magnesium 250 MG Tabs  Take 1 tablet by mouth daily.     meloxicam 15 MG tablet  Commonly known as:  MOBIC  Take 15 mg by mouth daily.     multivitamin with minerals Tabs tablet  Take 1 tablet by mouth daily.     oxyCODONE-acetaminophen 5-325 MG per tablet  Commonly known as:  PERCOCET/ROXICET  Take 1-2 tablets by mouth every 4 (four) hours as needed for moderate pain.     vitamin C 250 MG tablet  Commonly known as:  ASCORBIC ACID  Take 250 mg by mouth daily.           Follow-up Information   Follow up with [facility]. (Home Health RN)    Contact information:   [phone]  [place] [address] [phone]       Follow up with Johnathan Hausen B, MD In 10 days.   Specialty:  General Surgery   Contact information:   [address] [place] [address] [phone]       Signed: Pedro Earls 12/14/2013, 7:24 AM

## 2013-12-14 NOTE — Discharge Instructions (Signed)
Open Colectomy, Care After °Refer to this sheet in the next few weeks. These instructions provide you with information on caring for yourself after your procedure. Your health care provider may also give you more specific instructions. Your treatment has been planned according to current medical practices, but problems sometimes occur. Call your health care provider if you have any problems or questions after your procedure. °WHAT TO EXPECT AFTER THE PROCEDURE °After your procedure, it is typical to have the following: °· Pain in your abdomen, especially along your incision. You will be given medicines to control the pain. °· Tiredness. This is a normal part of the recovery process. Your energy level will return to normal over the next several weeks. °· Constipation. You may be given a stool softener to prevent this. °HOME CARE INSTRUCTIONS °· Only take over-the-counter or prescription medicines as directed by your health care provider. °· Ask your health care provider whether you may take a shower when you go home. °· You may resume a normal diet and activities as directed. Eat plenty of fruits and vegetables to help prevent constipation. °· Drink enough fluids to keep your urine clear or pale yellow. This also helps prevent constipation. °· Take rest breaks during the day as needed. °· Avoid lifting anything heavier than 25 pounds (11.3 kg) or driving for 4 weeks or until your health care provider says it is okay. °· Follow up with your health care provider as directed. Ask your health care provider when you need to return to have your stitches or staples removed. °SEEK MEDICAL CARE IF: °· You have redness, swelling, or increasing pain in the incision area. °· You see pus coming from the incision area. °· You have a fever. °SEEK IMMEDIATE MEDICAL CARE IF:  °· You have chest pain or shortness of breath. °· You have pain or swelling in your legs. °· You have persistent nausea and vomiting. °· Your wound breaks open  after stitches or staples have been removed. °· You have increasing abdominal pain that is not relieved with medicine. °Document Released: 10/10/2010 Document Revised: 01/07/2013 Document Reviewed: 10/29/2012 °ExitCare® Patient Information ©2015 [employer]. This information is not intended to replace advice given to you by your health care provider. Make sure you discuss any questions you have with your health care provider. ° °

## 2013-12-17 ENCOUNTER — Other Ambulatory Visit (INDEPENDENT_AMBULATORY_CARE_PROVIDER_SITE_OTHER): Payer: Self-pay

## 2013-12-21 ENCOUNTER — Other Ambulatory Visit: Payer: Self-pay | Admitting: Internal Medicine

## 2013-12-31 ENCOUNTER — Other Ambulatory Visit: Payer: Self-pay | Admitting: Internal Medicine

## 2013-12-31 ENCOUNTER — Telehealth: Payer: Self-pay

## 2013-12-31 MED ORDER — TIZANIDINE HCL 4 MG PO TABS: 4.0000 mg | ORAL_TABLET | Freq: Four times a day (QID) | ORAL | Status: DC | PRN

## 2013-12-31 NOTE — Telephone Encounter (Signed)
Patient informed. 

## 2013-12-31 NOTE — Telephone Encounter (Signed)
I dont feel comfortable with a 90 day supply, as this medication is not meant to be taken regularly for months

## 2013-12-31 NOTE — Telephone Encounter (Signed)
We/ve had some good luck recently with rx tizanidine instead, with good results  Ok for change flexeril to above - done erx

## 2013-12-31 NOTE — Telephone Encounter (Signed)
Patient has called stating insurance will not approve Cyclobenzaprine.  Advise for PA or alternative.  Patient states it does help her back.

## 2014-01-01 NOTE — Telephone Encounter (Signed)
Called the patient informed of MD instructions on refill request.  The patient understood denial for #90 quantity and was ok/agreed to.

## 2014-01-18 ENCOUNTER — Telehealth (INDEPENDENT_AMBULATORY_CARE_PROVIDER_SITE_OTHER): Payer: Self-pay

## 2014-01-18 NOTE — Telephone Encounter (Signed)
Pt is s/p colostomy takedown by Dr. Hassell Done on 12/04/13.  She is due for a f/u this week.  Nurse reports that the pt has been suffering from chronic heartburn.  She takes OTC meds without much relief.  I suggested the pt contact her PCP, Dr. Jenny Reichmann, for advice and/or prescription.  Will also make Dr. Hassell Done aware.  Message in [employer].

## 2014-01-21 NOTE — Telephone Encounter (Signed)
No notes in encounter. 

## 2014-02-22 ENCOUNTER — Other Ambulatory Visit: Payer: Self-pay | Admitting: Internal Medicine

## 2014-02-23 ENCOUNTER — Other Ambulatory Visit: Payer: Self-pay | Admitting: Internal Medicine

## 2014-02-23 NOTE — Telephone Encounter (Signed)
Done erx 

## 2014-03-09 ENCOUNTER — Encounter: Payer: Self-pay | Admitting: Internal Medicine

## 2014-03-09 ENCOUNTER — Ambulatory Visit (INDEPENDENT_AMBULATORY_CARE_PROVIDER_SITE_OTHER): Payer: Medicare Other | Admitting: Internal Medicine

## 2014-03-09 VITALS — BP 140/90 | HR 87 | Temp 98.3°F | Ht 71.0 in | Wt 147.0 lb

## 2014-03-09 DIAGNOSIS — Z0189 Encounter for other specified special examinations: Secondary | ICD-10-CM

## 2014-03-09 DIAGNOSIS — Z0000 Encounter for general adult medical examination without abnormal findings: Secondary | ICD-10-CM

## 2014-03-09 DIAGNOSIS — I10 Essential (primary) hypertension: Secondary | ICD-10-CM

## 2014-03-09 DIAGNOSIS — E785 Hyperlipidemia, unspecified: Secondary | ICD-10-CM

## 2014-03-09 DIAGNOSIS — J3089 Other allergic rhinitis: Secondary | ICD-10-CM

## 2014-03-09 DIAGNOSIS — M5489 Other dorsalgia: Secondary | ICD-10-CM

## 2014-03-09 MED ORDER — MELOXICAM 15 MG PO TABS
15.0000 mg | ORAL_TABLET | Freq: Every day | ORAL | Status: DC
Start: 2014-03-09 — End: 2015-05-01

## 2014-03-09 MED ORDER — FLUTICASONE PROPIONATE 50 MCG/ACT NA SUSP: 2.0000 | Freq: Every day | NASAL | Status: DC

## 2014-03-09 NOTE — Progress Notes (Signed)
Subjective:    Patient ID: Tammy Duke, female    DOB: 1940-12-26, 73 y.o.   MRN: [id number]  HPI  Here to f/u; overall doing ok,  Pt denies chest pain, increased sob or doe, wheezing, orthopnea, PND, increased LE swelling, palpitations, dizziness or syncope.  Pt denies polydipsia, polyuria, or low sugar symptoms such as weakness or confusion improved with po intake.  Pt denies new neurological symptoms such as new headache, or facial or extremity weakness or numbness.   Pt states overall good compliance with meds, has been trying to follow lower cholesterol diet, with wt overall stable,  but little exercise however. Most recent a1c normal sept 2015.  Pt continues to have recurring LBP without change in severity, bowel or bladder change, fever, wt loss,  worsening LE pain/numbness/weakness, gait change or falls. Melociam help previously. Tizanidine helps as well.  Does have several wks ongoing nasal allergy symptoms with clearish congestion, itch and sneezing, without fever, pain, ST, cough, swelling or wheezing. Uses wheeled alker only when outside the home for safety. Past Medical History  Diagnosis Date  . GOITER, MULTINODULAR 05/04/2009  . HYPOTHYROIDISM 04/21/2009  . HYPERTENSION 04/21/2009  . ALLERGIC RHINITIS 04/21/2009  . SYNCOPE 01/02/2010  . FATIGUE 04/21/2009  . THYROID CANCER, HX OF 04/21/2009  . Osteoporosis 10/28/2011  . Environmental allergies   . Degenerative arthritis of right knee 09/24/2012  . Colonic fistula   . Cancer 1984    thyroid  . SEIZURE DISORDER     "under control"  . Headache(784.0)     hx of  . H/O: whooping cough 73 years old  . GERD (gastroesophageal reflux disease)     "tums controls it"  . Back spasm   . Personal history of arthritis   . Other malignant neoplasm without specification of site   . Menopausal syndrome    Past Surgical History  Procedure Laterality Date  . S/p left radius fracture surgury  2001    Dr. Rhona Raider, Ortho.  . S/p thyroid  surgury for cancer  1985    biopsy--PARTIAL THYROIDECTOMY  . Hx of right retenal detachment   Sept. 2005    partial  . S/p right cataract with lens implant    . Laparoscopic sigmoid colectomy  01/30/2012    Procedure: LAPAROSCOPIC SIGMOID COLECTOMY;  Surgeon: Pedro Earls, MD;  Location: [facility];  Service: General;  Laterality: N/A;  Lap Assisted Sigmoid Colectomy with Takedown of Colovesicle Fistula  . Take down of intestinal fistula  01/30/2012    Procedure: TAKE DOWN OF INTESTINAL FISTULA;  Surgeon: Pedro Earls, MD;  Location: [facility];  Service: General;  Laterality: N/A;  . Vesico-vaginal fistula repair  01/30/2012    Procedure: FISTULA REPAIR VESICO-VAGINAL;  Surgeon: Reece Packer, MD;  Location: [facility];  Service: Urology;  Laterality: N/A;  Enterovesical Fistula Repair  . Colon surgery    . Total knee arthroplasty Right 02/10/2013    Procedure: TOTAL KNEE ARTHROPLASTY;  Surgeon: Hessie Dibble, MD;  Location: [place];  Service: Orthopedics;  Laterality: Right;  . Fracture surgery      left arm  . Colon resection N/A 07/28/2013    Procedure: LAPAROSCOPIC  SPLENIC FLEXURE mobilizationTAKEDOWN inner enterCOLOVESICLE FISTULAr  , lysis of adhesions.hartman procedure, partial salpingoectomey,   cystoscopy ;  Surgeon: Pedro Earls, MD;  Location: [facility];  Service: General;  Laterality: N/A;  . Vesico-vaginal fistula repair Left 07/28/2013    Procedure: cystoscopy;  Surgeon: Elayne Snare  MacDiarmid, MD;  Location: [facility];  Service: Urology;  Laterality: Left;  . Colostomy takedown N/A 12/04/2013    Procedure: COLOSTOMY TAKEDOWN (HARTMAN)/takedown of Hartman pouch and end colostomy;  Surgeon: Kaylyn Lim, MD;  Location: [facility];  Service: General;  Laterality: N/A;    reports that she quit smoking about 31 years ago. Her smoking use included Cigarettes. She smoked 0.00 packs per day for 10 years. She has never used smokeless tobacco. She reports that she does not drink alcohol or use  illicit drugs. family history includes Bipolar disorder in her brother and sister; Goiter in her mother; Heart attack in her brother; Heart disease in her father and mother; Seizures in her other; Stroke in her father. Allergies  Allergen Reactions  . Sulfonamide Derivatives Anaphylaxis    caused heart to stop as child  . Erythromycin     Interacts with seizure medication  . Latex Rash   Current Outpatient Prescriptions on File Prior to Visit  Medication Sig Dispense Refill  . acetaminophen (TYLENOL) 500 MG tablet Take 1,000 mg by mouth every 6 (six) hours as needed for mild pain or moderate pain.    Marland Kitchen aspirin EC 81 MG tablet Take 81 mg by mouth daily.    . Calcium Carbonate-Vitamin D (CALTRATE 600+D) 600-400 MG-UNIT per tablet Take 1 tablet by mouth 2 (two) times daily.      . carbamazepine (TEGRETOL) 200 MG tablet Take 200-300 mg by mouth 4 (four) times daily. Takes 1.5 tablet every morning and at bedtime and 1 tablet at lunch and dinner    . carbamazepine (TEGRETOL) 200 MG tablet TAKE 1 AND 1/2 TABLETS BY MOUTH TWICE DAILY AND 1 AT LUNCH AND SUPPER AS DIRECTED 450 tablet 1  . cyclobenzaprine (FLEXERIL) 5 MG tablet TAKE 1 TABLET BY MOUTH THREE TIMES DAILY AS NEEDED FOR MUSCLE SPASMS 90 tablet 0  . levothyroxine (SYNTHROID, LEVOTHROID) 100 MCG tablet Take 100 mcg by mouth daily before breakfast.    . losartan (COZAAR) 50 MG tablet TAKE 1 TABLET BY MOUTH EVERY DAY 90 tablet 3  . Magnesium 250 MG TABS Take 1 tablet by mouth daily.     . meloxicam (MOBIC) 15 MG tablet Take 15 mg by mouth daily.    . Multiple Vitamin (MULTIVITAMIN WITH MINERALS) TABS tablet Take 1 tablet by mouth daily.    Marland Kitchen oxyCODONE-acetaminophen (PERCOCET/ROXICET) 5-325 MG per tablet Take 1-2 tablets by mouth every 4 (four) hours as needed for moderate pain. 30 tablet 0  . tiZANidine (ZANAFLEX) 4 MG tablet TAKE 1 TABLET BY MOUTH EVERY 6 HOURS AS NEEDED FOR MUSCLE SPASMS 375 tablet 0  . vitamin C (ASCORBIC ACID) 250 MG tablet  Take 250 mg by mouth daily.     No current facility-administered medications on file prior to visit.   Review of Systems  Constitutional: Negative for unusual diaphoresis or other sweats  HENT: Negative for ringing in ear Eyes: Negative for double vision or worsening visual disturbance.  Respiratory: Negative for choking and stridor.   Gastrointestinal: Negative for vomiting or other signifcant bowel change Genitourinary: Negative for hematuria or decreased urine volume.  Musculoskeletal: Negative for other MSK pain or swelling Skin: Negative for color change and worsening wound.  Neurological: Negative for tremors and numbness other than noted  Psychiatric/Behavioral: Negative for decreased concentration or agitation other than above       Objective:   Physical Exam BP 140/90 mmHg  Pulse 87  Temp(Src) 98.3 F (36.8 C) (Oral)  Ht  5\' 11"  (1.803 m)  Wt 147 lb (66.679 kg)  BMI 20.51 kg/m2  SpO2 98% VS noted,  Constitutional: Pt appears well-developed, well-nourished.  HENT: Head: NCAT.  Right Ear: External ear normal.  Left Ear: External ear normal.  Bilat tm's with mild erythema.  Max sinus areas tender.  Pharynx with mild erythema, no exudate  Eyes: . Pupils are equal, round, and reactive to light. Conjunctivae and EOM are normal Neck: Normal range of motion. Neck supple.  Cardiovascular: Normal rate and regular rhythm.   Pulmonary/Chest: Effort normal and breath sounds normal.  Abd:  Soft, NT, ND, + BS Spine nontender Neurological: Pt is alert. Not confused , motor grossly intact Skin: Skin is warm. No rash Psychiatric: Pt behavior is normal. No agitation. mild nervous    Assessment & Plan:

## 2014-03-09 NOTE — Assessment & Plan Note (Signed)
stable overall by history and exam, recent data reviewed with pt, and pt to continue medical treatment as before,  to f/u any worsening symptoms or concerns Lab Results  Component Value Date   LDLCALC 83 09/22/2013

## 2014-03-09 NOTE — Patient Instructions (Signed)
Please take all new medication as prescribed - the flonase  OK to re-start the meloxicam as you have taken before  Please continue all other medications as before, and refills have been done if requested.  Please have the pharmacy call with any other refills you may need.  Please continue your efforts at being more active, low cholesterol diet, and weight control.  Please keep your appointments with your specialists as you may have planned  Please return in 6 months, or sooner if needed, with Lab testing done 3-5 days before

## 2014-03-09 NOTE — Assessment & Plan Note (Signed)
Ok for flonase asd,  to f/u any worsening symptoms or concerns  

## 2014-03-09 NOTE — Assessment & Plan Note (Signed)
stable overall by history and exam, recent data reviewed with pt, and pt to continue medical treatment as before,  to f/u any worsening symptoms or concerns BP Readings from Last 3 Encounters:  03/09/14 140/90  12/14/13 180/97  12/01/13 137/87

## 2014-03-09 NOTE — Assessment & Plan Note (Signed)
[place] for re-start mobic prn,  to f/u any worsening symptoms or concerns, exam benign

## 2014-03-09 NOTE — Progress Notes (Signed)
Pre visit review using our clinic review tool, if applicable. No additional management support is needed unless otherwise documented below in the visit note. 

## 2014-03-10 ENCOUNTER — Telehealth: Payer: Self-pay | Admitting: Internal Medicine

## 2014-03-10 NOTE — Telephone Encounter (Signed)
emmi mailed  °

## 2014-03-16 ENCOUNTER — Ambulatory Visit (INDEPENDENT_AMBULATORY_CARE_PROVIDER_SITE_OTHER): Payer: Medicare Other | Admitting: Ophthalmology

## 2014-03-16 DIAGNOSIS — H338 Other retinal detachments: Secondary | ICD-10-CM

## 2014-03-16 DIAGNOSIS — H33302 Unspecified retinal break, left eye: Secondary | ICD-10-CM

## 2014-03-16 DIAGNOSIS — H34832 Tributary (branch) retinal vein occlusion, left eye: Secondary | ICD-10-CM

## 2014-03-16 DIAGNOSIS — I10 Essential (primary) hypertension: Secondary | ICD-10-CM

## 2014-03-16 DIAGNOSIS — H35033 Hypertensive retinopathy, bilateral: Secondary | ICD-10-CM

## 2014-03-31 NOTE — Telephone Encounter (Signed)
error 

## 2014-05-17 ENCOUNTER — Other Ambulatory Visit: Payer: Self-pay | Admitting: Internal Medicine

## 2014-06-20 ENCOUNTER — Other Ambulatory Visit: Payer: Self-pay | Admitting: Internal Medicine

## 2014-06-29 ENCOUNTER — Telehealth: Payer: Self-pay

## 2014-06-29 ENCOUNTER — Telehealth: Payer: Self-pay | Admitting: Internal Medicine

## 2014-06-29 MED ORDER — ALENDRONATE SODIUM 70 MG PO TABS: 70.0000 mg | ORAL_TABLET | ORAL | Status: AC

## 2014-06-29 NOTE — Telephone Encounter (Signed)
Rec'd records from [employer] [place] 3 pages to [employer]

## 2014-06-29 NOTE — Telephone Encounter (Signed)
Pt. Wants to start the Fosamax. She would like for the RX to be sent to the [facility] on [address]

## 2014-06-29 NOTE — Telephone Encounter (Signed)
I think fosamax is a very good choice, as it is usually easy to take, generic, once per wk, and no problems with other issues like siezure

## 2014-06-29 NOTE — Telephone Encounter (Signed)
Done erx 

## 2014-06-29 NOTE — Telephone Encounter (Signed)
Patient wants knows if Fosamax is a good choice of a medication for preventing her osteoporosis. She wants to take a medication with the slowest amount of side effects since she suffers from a seizure disorder and she has a history of cancer. She said that she is willing to come in for an OV if needed.

## 2014-07-08 ENCOUNTER — Telehealth: Payer: Self-pay | Admitting: Internal Medicine

## 2014-07-08 NOTE — Telephone Encounter (Signed)
Rec'd from [facility] forward 2 pages to Dr. Jenny Reichmann

## 2014-08-04 ENCOUNTER — Telehealth: Payer: Self-pay | Admitting: Internal Medicine

## 2014-08-04 NOTE — Telephone Encounter (Signed)
Received records from [place] and [place] sent to Dr. Cathlean Cower 08/04/14 fbg.

## 2014-08-18 ENCOUNTER — Other Ambulatory Visit: Payer: Self-pay | Admitting: Internal Medicine

## 2014-09-13 ENCOUNTER — Other Ambulatory Visit: Payer: Self-pay | Admitting: Internal Medicine

## 2014-09-15 ENCOUNTER — Ambulatory Visit (INDEPENDENT_AMBULATORY_CARE_PROVIDER_SITE_OTHER): Payer: Medicare Other | Admitting: Ophthalmology

## 2014-09-15 DIAGNOSIS — H34832 Tributary (branch) retinal vein occlusion, left eye: Secondary | ICD-10-CM | POA: Diagnosis not present

## 2014-09-15 DIAGNOSIS — H35033 Hypertensive retinopathy, bilateral: Secondary | ICD-10-CM | POA: Diagnosis not present

## 2014-09-15 DIAGNOSIS — I10 Essential (primary) hypertension: Secondary | ICD-10-CM | POA: Diagnosis not present

## 2014-09-15 DIAGNOSIS — H35341 Macular cyst, hole, or pseudohole, right eye: Secondary | ICD-10-CM | POA: Diagnosis not present

## 2014-09-15 DIAGNOSIS — H43813 Vitreous degeneration, bilateral: Secondary | ICD-10-CM

## 2014-09-15 DIAGNOSIS — H33303 Unspecified retinal break, bilateral: Secondary | ICD-10-CM

## 2014-09-22 LAB — HM MAMMOGRAPHY

## 2014-09-23 ENCOUNTER — Other Ambulatory Visit (INDEPENDENT_AMBULATORY_CARE_PROVIDER_SITE_OTHER): Payer: Medicare Other

## 2014-09-23 ENCOUNTER — Encounter: Payer: Self-pay | Admitting: Internal Medicine

## 2014-09-23 DIAGNOSIS — Z0189 Encounter for other specified special examinations: Secondary | ICD-10-CM

## 2014-09-23 DIAGNOSIS — Z0000 Encounter for general adult medical examination without abnormal findings: Secondary | ICD-10-CM

## 2014-09-23 LAB — LIPID PANEL
CHOLESTEROL: 196 mg/dL (ref 0–200)
HDL: 74.8 mg/dL (ref >39.00)
LDL CALC: 108 mg/dL — AB (ref 0–99)
NonHDL: 121.2
Total CHOL/HDL Ratio: 3
Triglycerides: 65 mg/dL (ref 0.0–149.0)
VLDL: 13 mg/dL (ref 0.0–40.0)

## 2014-09-23 LAB — BASIC METABOLIC PANEL
BUN: 16 mg/dL (ref 6–23)
CO2: 28 meq/L (ref 19–32)
Calcium: 9 mg/dL (ref 8.4–10.5)
Chloride: 93 mEq/L — ABNORMAL LOW (ref 96–112)
Creatinine, Ser: 0.77 mg/dL (ref 0.40–1.20)
GFR: 78.2 mL/min (ref >60.00)
GLUCOSE: 92 mg/dL (ref 70–99)
Potassium: 3.9 mEq/L (ref 3.5–5.1)
SODIUM: 129 meq/L — AB (ref 135–145)

## 2014-09-23 LAB — HEPATIC FUNCTION PANEL
ALK PHOS: 79 U/L (ref 39–117)
ALT: 24 U/L (ref 0–35)
AST: 21 U/L (ref 0–37)
Albumin: 4.1 g/dL (ref 3.5–5.2)
BILIRUBIN TOTAL: 0.7 mg/dL (ref 0.2–1.2)
Bilirubin, Direct: 0.2 mg/dL (ref 0.0–0.3)
Total Protein: 6.4 g/dL (ref 6.0–8.3)

## 2014-09-23 LAB — CBC WITH DIFFERENTIAL/PLATELET
Basophils Absolute: 0 10*3/uL (ref 0.0–0.1)
Basophils Relative: 0.3 % (ref 0.0–3.0)
EOS PCT: 0.5 % (ref 0.0–5.0)
Eosinophils Absolute: 0 10*3/uL (ref 0.0–0.7)
HCT: 43 % (ref 36.0–46.0)
Hemoglobin: 14.5 g/dL (ref 12.0–15.0)
LYMPHS ABS: 0.8 10*3/uL (ref 0.7–4.0)
Lymphocytes Relative: 12.3 % (ref 12.0–46.0)
MCHC: 33.8 g/dL (ref 30.0–36.0)
MCV: 98.3 fl (ref 78.0–100.0)
MONO ABS: 0.4 10*3/uL (ref 0.1–1.0)
Monocytes Relative: 5.3 % (ref 3.0–12.0)
Neutro Abs: 5.6 10*3/uL (ref 1.4–7.7)
Neutrophils Relative %: 81.6 % — ABNORMAL HIGH (ref 43.0–77.0)
Platelets: 273 10*3/uL (ref 150.0–400.0)
RBC: 4.38 Mil/uL (ref 3.87–5.11)
RDW: 14.1 % (ref 11.5–15.5)
WBC: 6.9 10*3/uL (ref 4.0–10.5)

## 2014-09-23 LAB — TSH: TSH: 1.54 u[IU]/mL (ref 0.35–4.50)

## 2014-09-24 LAB — URINALYSIS, ROUTINE W REFLEX MICROSCOPIC
HGB URINE DIPSTICK: NEGATIVE
Ketones, ur: NEGATIVE
Nitrite: POSITIVE — AB
Specific Gravity, Urine: <=1.005 — AB (ref 1.000–1.030)
Total Protein, Urine: >=300 — AB
URINE GLUCOSE: NEGATIVE
Urobilinogen, UA: 1 (ref 0.0–1.0)
pH: >=9 — AB (ref 5.0–8.0)

## 2014-09-29 ENCOUNTER — Ambulatory Visit (INDEPENDENT_AMBULATORY_CARE_PROVIDER_SITE_OTHER): Payer: Medicare Other | Admitting: Internal Medicine

## 2014-09-29 ENCOUNTER — Encounter: Payer: Self-pay | Admitting: Internal Medicine

## 2014-09-29 VITALS — BP 124/82 | HR 91 | Temp 97.8°F | Ht 70.0 in | Wt 139.0 lb

## 2014-09-29 DIAGNOSIS — I10 Essential (primary) hypertension: Secondary | ICD-10-CM | POA: Diagnosis not present

## 2014-09-29 DIAGNOSIS — R300 Dysuria: Secondary | ICD-10-CM | POA: Diagnosis not present

## 2014-09-29 DIAGNOSIS — E039 Hypothyroidism, unspecified: Secondary | ICD-10-CM | POA: Diagnosis not present

## 2014-09-29 DIAGNOSIS — Z0000 Encounter for general adult medical examination without abnormal findings: Secondary | ICD-10-CM | POA: Diagnosis not present

## 2014-09-29 DIAGNOSIS — E785 Hyperlipidemia, unspecified: Secondary | ICD-10-CM | POA: Diagnosis not present

## 2014-09-29 MED ORDER — CEPHALEXIN 500 MG PO CAPS: 500.0000 mg | ORAL_CAPSULE | Freq: Four times a day (QID) | ORAL | Status: DC

## 2014-09-29 NOTE — Patient Instructions (Signed)
Please take all new medication as prescribed - the antibiotic  Please continue all other medications as before, and refills have been done if requested.  Please have the pharmacy call with any other refills you may need.  Please continue your efforts at being more active, low cholesterol diet, and weight control.  You are otherwise up to date with prevention measures today.  Please keep your appointments with your specialists as you may have planned  Please return in 6 months, or sooner if needed 

## 2014-09-29 NOTE — Progress Notes (Signed)
Pre visit review using our clinic review tool, if applicable. No additional management support is needed unless otherwise documented below in the visit note. 

## 2014-09-29 NOTE — Assessment & Plan Note (Signed)
Incidental, with recent onset symtpoms and abnormal UA - Mild to mod, for antibx course,  to f/u any worsening symptoms or concerns

## 2014-09-29 NOTE — Assessment & Plan Note (Signed)
stable overall by history and exam, recent data reviewed with pt, and pt to continue medical treatment as before,  to f/u any worsening symptoms or concerns Lab Results  Component Value Date   TSH 1.54 09/23/2014

## 2014-09-29 NOTE — Progress Notes (Signed)
Subjective:    Patient ID: Tammy Duke, female    DOB: [birth year], 74 y.o.   MRN: [id number]  HPI  Here for wellness and f/u;  Overall doing ok;  Pt denies Chest pain, worsening SOB, DOE, wheezing, orthopnea, PND, worsening LE edema, palpitations, dizziness or syncope.  Pt denies neurological change such as new headache, facial or extremity weakness.  Pt denies polydipsia, polyuria, or low sugar symptoms. Pt states overall good compliance with treatment and medications, good tolerability, and has been trying to follow appropriate diet.  Pt denies worsening depressive symptoms, suicidal ideation or panic. No fever, night sweats, wt loss, loss of appetite, or other constitutional symptoms.  Pt states good ability with ADL's, has low fall risk, home safety reviewed and adequate, no other significant changes in hearing or vision.  Has had mild dysuria x 3-4 days, but Denies urinary symptoms such as frequency, urgency, flank pain, hematuria or n/v, fever, chills. Past Medical History  Diagnosis Date  . GOITER, MULTINODULAR 05/04/2009  . HYPOTHYROIDISM 04/21/2009  . HYPERTENSION 04/21/2009  . ALLERGIC RHINITIS 04/21/2009  . SYNCOPE 01/02/2010  . FATIGUE 04/21/2009  . THYROID CANCER, HX OF 04/21/2009  . Osteoporosis 10/28/2011  . Environmental allergies   . Degenerative arthritis of right knee 09/24/2012  . Colonic fistula   . Cancer 1984    thyroid  . SEIZURE DISORDER     "under control"  . Headache(784.0)     hx of  . H/O: whooping cough 74 years old  . GERD (gastroesophageal reflux disease)     "tums controls it"  . Back spasm   . Personal history of arthritis   . Other malignant neoplasm without specification of site   . Menopausal syndrome    Past Surgical History  Procedure Laterality Date  . S/p left radius fracture surgury  2001    Dr. Rhona Raider, Ortho.  . S/p thyroid surgury for cancer  1985    biopsy--PARTIAL THYROIDECTOMY  . Hx of right retenal detachment   Sept. 2005   partial  . S/p right cataract with lens implant    . Laparoscopic sigmoid colectomy  01/30/2012    Procedure: LAPAROSCOPIC SIGMOID COLECTOMY;  Surgeon: Pedro Earls, MD;  Location: [facility];  Service: General;  Laterality: N/A;  Lap Assisted Sigmoid Colectomy with Takedown of Colovesicle Fistula  . Take down of intestinal fistula  01/30/2012    Procedure: TAKE DOWN OF INTESTINAL FISTULA;  Surgeon: Pedro Earls, MD;  Location: [facility];  Service: General;  Laterality: N/A;  . Vesico-vaginal fistula repair  01/30/2012    Procedure: FISTULA REPAIR VESICO-VAGINAL;  Surgeon: Reece Packer, MD;  Location: [facility];  Service: Urology;  Laterality: N/A;  Enterovesical Fistula Repair  . Colon surgery    . Total knee arthroplasty Right 02/10/2013    Procedure: TOTAL KNEE ARTHROPLASTY;  Surgeon: Hessie Dibble, MD;  Location: [place] [facility];  Service: Orthopedics;  Laterality: Right;  . Fracture surgery      left arm  . Colon resection N/A 07/28/2013    Procedure: LAPAROSCOPIC  SPLENIC FLEXURE mobilizationTAKEDOWN inner enterCOLOVESICLE FISTULAr  , lysis of adhesions.hartman procedure, partial salpingoectomey,   cystoscopy ;  Surgeon: Pedro Earls, MD;  Location: [facility];  Service: General;  Laterality: N/A;  . Vesico-vaginal fistula repair Left 07/28/2013    Procedure: cystoscopy;  Surgeon: Reece Packer, MD;  Location: [facility];  Service: Urology;  Laterality: Left;  . Colostomy takedown N/A 12/04/2013    Procedure:  COLOSTOMY TAKEDOWN (HARTMAN)/takedown of Hartman pouch and end colostomy;  Surgeon: Kaylyn Lim, MD;  Location: [facility];  Service: General;  Laterality: N/A;    reports that she quit smoking about 32 years ago. Her smoking use included Cigarettes. She quit after 10 years of use. She has never used smokeless tobacco. She reports that she does not drink alcohol or use illicit drugs. family history includes Bipolar disorder in her brother and sister; Goiter in her mother; Heart attack in her  brother; Heart disease in her father and mother; Seizures in her other; Stroke in her father. Allergies  Allergen Reactions  . Sulfonamide Derivatives Anaphylaxis    caused heart to stop as child  . Erythromycin     Interacts with seizure medication  . Latex Rash   Current Outpatient Prescriptions on File Prior to Visit  Medication Sig Dispense Refill  . acetaminophen (TYLENOL) 500 MG tablet Take 1,000 mg by mouth every 6 (six) hours as needed for mild pain or moderate pain.    Marland Kitchen alendronate (FOSAMAX) 70 MG tablet Take 1 tablet (70 mg total) by mouth every 7 (seven) days. Take with a full glass of water on an empty stomach. 12 tablet 3  . aspirin EC 81 MG tablet Take 81 mg by mouth daily.    . Calcium Carbonate-Vitamin D (CALTRATE 600+D) 600-400 MG-UNIT per tablet Take 1 tablet by mouth 2 (two) times daily.      . carbamazepine (TEGRETOL) 200 MG tablet TAKE 1&1/2 TABLET BY MOUTH TWICE DAILY AND 1 AT LUNCH AND SUPPER AS DIRECTED 450 tablet 0  . fluticasone (FLONASE) 50 MCG/ACT nasal spray Place 2 sprays into both nostrils daily. 16 g 2  . levothyroxine (SYNTHROID, LEVOTHROID) 100 MCG tablet Take 100 mcg by mouth daily before breakfast.    . losartan (COZAAR) 50 MG tablet TAKE 1 TABLET BY MOUTH EVERY DAY 90 tablet 3  . Magnesium 250 MG TABS Take 1 tablet by mouth daily.     . Multiple Vitamin (MULTIVITAMIN WITH MINERALS) TABS tablet Take 1 tablet by mouth daily.    . Probiotic Product (PROBIOTIC PO) Take by mouth daily. OTC    . tiZANidine (ZANAFLEX) 4 MG tablet TAKE 1 TABLET BY MOUTH EVERY 6 HOURS AS NEEDED FOR MUSCLE SPASMS 375 tablet 0  . vitamin C (ASCORBIC ACID) 250 MG tablet Take 250 mg by mouth daily.    . meloxicam (MOBIC) 15 MG tablet Take 1 tablet (15 mg total) by mouth daily. (Patient not taking: Reported on 09/29/2014) 90 tablet 3  . oxyCODONE-acetaminophen (PERCOCET/ROXICET) 5-325 MG per tablet Take 1-2 tablets by mouth every 4 (four) hours as needed for moderate pain. (Patient not  taking: Reported on 09/29/2014) 30 tablet 0   No current facility-administered medications on file prior to visit.   Review of Systems Constitutional: Negative for increased diaphoresis, other activity, appetite or siginficant weight change other than noted HENT: Negative for worsening hearing loss, ear pain, facial swelling, mouth sores and neck stiffness.   Eyes: Negative for other worsening pain, redness or visual disturbance.  Respiratory: Negative for shortness of breath and wheezing  Cardiovascular: Negative for chest pain and palpitations.  Gastrointestinal: Negative for diarrhea, blood in stool, abdominal distention or other pain Genitourinary: Negative for hematuria, flank pain or change in urine volume.  Musculoskeletal: Negative for myalgias or other joint complaints.  Skin: Negative for color change and wound or drainage.  Neurological: Negative for syncope and numbness. other than noted Hematological: Negative for adenopathy. or  other swelling Psychiatric/Behavioral: Negative for hallucinations, SI, self-injury, decreased concentration or other worsening agitation.      Objective:   Physical Exam BP 124/82 mmHg  Pulse 91  Temp(Src) 97.8 F (36.6 C) (Oral)  Ht 5\' 10"  (1.778 m)  Wt 139 lb (63.05 kg)  BMI 19.94 kg/m2  SpO2 98% VS noted,  Constitutional: Pt is oriented to person, place, and time. Appears well-developed and well-nourished, in no significant distress Head: Normocephalic and atraumatic.  Right Ear: External ear normal.  Left Ear: External ear normal.  Nose: Nose normal.  Mouth/Throat: Oropharynx is clear and moist.  Eyes: Conjunctivae and EOM are normal. Pupils are equal, round, and reactive to light.  Neck: Normal range of motion. Neck supple. No JVD present. No tracheal deviation present or significant neck LA or mass Cardiovascular: Normal rate, regular rhythm, normal heart sounds and intact distal pulses.   Pulmonary/Chest: Effort normal and breath  sounds without rales or wheezing  Abdominal: Soft. Bowel sounds are normal. NT. No HSM  Musculoskeletal: Normal range of motion. Exhibits no edema.  Lymphadenopathy:  Has no cervical adenopathy.  Neurological: Pt is alert and oriented to person, place, and time. Pt has normal reflexes. No cranial nerve deficit. Motor grossly intact Skin: Skin is warm and dry. No rash noted.  Psychiatric:  Has normal mood and affect. Behavior is normal.      Assessment & Plan:

## 2014-09-29 NOTE — Assessment & Plan Note (Signed)
stable overall by history and exam, recent data reviewed with pt, and pt to continue medical treatment as before,  to f/u any worsening symptoms or concerns BP Readings from Last 3 Encounters:  09/29/14 124/82  03/09/14 140/90  12/14/13 180/97

## 2014-09-29 NOTE — Assessment & Plan Note (Signed)
stable overall by history and exam, recent data reviewed with pt, and pt to continue medical treatment as before,  to f/u any worsening symptoms or concerns Lab Results  Component Value Date   LDLCALC 108* 09/23/2014

## 2014-09-29 NOTE — Assessment & Plan Note (Signed)

## 2014-10-20 ENCOUNTER — Ambulatory Visit (INDEPENDENT_AMBULATORY_CARE_PROVIDER_SITE_OTHER): Payer: Medicare Other | Admitting: Endocrinology

## 2014-10-20 ENCOUNTER — Encounter: Payer: Self-pay | Admitting: Endocrinology

## 2014-10-20 VITALS — BP 128/82 | HR 96 | Temp 97.8°F | Wt 140.0 lb

## 2014-10-20 DIAGNOSIS — E042 Nontoxic multinodular goiter: Secondary | ICD-10-CM

## 2014-10-20 NOTE — Progress Notes (Signed)
Subjective:    Patient ID: Tammy Duke, female    DOB: Jun 07, 1940, 74 y.o.   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  ultrasounds since then have showed a small multinodular goiter, so she was reassigned a dx of benign multinodular goiter.  She does not notice any nodule at the neck.  She has chronic primary hypothyroidism, and has been on synthroid for many years.  pt states she feels well in general.  She takes synthroid as rx'ed.  Serial ultrasounds (most recently in 2015) have been stable.  Past Medical History  Diagnosis Date  . GOITER, MULTINODULAR 05/04/2009  . HYPOTHYROIDISM 04/21/2009  . HYPERTENSION 04/21/2009  . ALLERGIC RHINITIS 04/21/2009  . SYNCOPE 01/02/2010  . FATIGUE 04/21/2009  . THYROID CANCER, HX OF 04/21/2009  . Osteoporosis 10/28/2011  . Environmental allergies   . Degenerative arthritis of right knee 09/24/2012  . Colonic fistula   . Cancer 1984    thyroid  . SEIZURE DISORDER     "under control"  . Headache(784.0)     hx of  . H/O: whooping cough 74 years old  . GERD (gastroesophageal reflux disease)     "tums controls it"  . Back spasm   . Personal history of arthritis   . Other malignant neoplasm without specification of site   . Menopausal syndrome     Past Surgical History  Procedure Laterality Date  . S/p left radius fracture surgury  2001    Dr. Rhona Raider, Ortho.  . S/p thyroid surgury for cancer  1985    biopsy--PARTIAL THYROIDECTOMY  . Hx of right retenal detachment   Sept. 2005    partial  . S/p right cataract with lens implant    . Laparoscopic sigmoid colectomy  01/30/2012    Procedure: LAPAROSCOPIC SIGMOID COLECTOMY;  Surgeon: Pedro Earls, MD;  Location: [facility];  Service: General;  Laterality: N/A;  Lap Assisted Sigmoid Colectomy with Takedown of Colovesicle Fistula  . Take down of intestinal fistula  01/30/2012    Procedure: TAKE DOWN OF  INTESTINAL FISTULA;  Surgeon: Pedro Earls, MD;  Location: [facility];  Service: General;  Laterality: N/A;  . Vesico-vaginal fistula repair  01/30/2012    Procedure: FISTULA REPAIR VESICO-VAGINAL;  Surgeon: Reece Packer, MD;  Location[facility];  Service: Urology;  Laterality: N/A;  Enterovesical Fistula Repair  . Colon surgery    . Total knee arthroplasty Right 02/10/2013    Procedure: TOTAL KNEE ARTHROPLASTY;  Surgeon: Hessie Dibble, MD;  Location: [facility];  Service: Orthopedics;  Laterality: Right;  . Fracture surgery      left arm  . Colon resection N/A 07/28/2013    Procedure: LAPAROSCOPIC  SPLENIC FLEXURE mobilizationTAKEDOWN inner enterCOLOVESICLE FISTULAr  , lysis of adhesions.hartman procedure, partial salpingoectomey,   cystoscopy ;  Surgeon: Pedro Earls, MD;  Location: [facility];  Service: General;  Laterality: N/A;  . Vesico-vaginal fistula repair Left 07/28/2013    Procedure: cystoscopy;  Surgeon: Reece Packer, MD;  Location: [facility];  Service: Urology;  Laterality: Left;  . Colostomy takedown N/A 12/04/2013    Procedure: COLOSTOMY TAKEDOWN (HARTMAN)/takedown of Hartman pouch and end colostomy;  Surgeon: Kaylyn Lim, MD;  Location[facility];  Service: General;  Laterality: N/A;    History   Social History  . Marital Status: Widowed    Spouse Name: N/A  . Number of Children:  N/A  . Years of Education: N/A   Occupational History  . retired [employer] History Main Topics  . Smoking status: Former Smoker -- 10 years    Types: Cigarettes    Quit date: 04/02/1982  . Smokeless tobacco: Never Used     Comment: 1 pack would last 2 weeks  . Alcohol Use: No  . Drug Use: No  . Sexual Activity: No   Other Topics Concern  . Not on file   Social History Narrative   Lives alone in apt.    Current Outpatient Prescriptions on File Prior to Visit  Medication Sig Dispense Refill  . acetaminophen (TYLENOL) 500 MG tablet Take 1,000 mg by mouth every 6 (six)  hours as needed for mild pain or moderate pain.    Marland Kitchen acetaminophen-codeine (TYLENOL #3) 300-30 MG per tablet TK 1 T PO TID PRN  1  . alendronate (FOSAMAX) 70 MG tablet Take 1 tablet (70 mg total) by mouth every 7 (seven) days. Take with a full glass of water on an empty stomach. 12 tablet 3  . carbamazepine (TEGRETOL) 200 MG tablet TAKE 1&1/2 TABLET BY MOUTH TWICE DAILY AND 1 AT LUNCH AND SUPPER AS DIRECTED 450 tablet 0  . cephALEXin (KEFLEX) 500 MG capsule Take 1 capsule (500 mg total) by mouth 4 (four) times daily. 40 capsule 0  . fluticasone (FLONASE) 50 MCG/ACT nasal spray Place 2 sprays into both nostrils daily. 16 g 2  . levothyroxine (SYNTHROID, LEVOTHROID) 100 MCG tablet Take 100 mcg by mouth daily before breakfast.    . losartan (COZAAR) 50 MG tablet TAKE 1 TABLET BY MOUTH EVERY DAY 90 tablet 3  . Probiotic Product (PROBIOTIC PO) Take by mouth daily. OTC    . tiZANidine (ZANAFLEX) 4 MG tablet TAKE 1 TABLET BY MOUTH EVERY 6 HOURS AS NEEDED FOR MUSCLE SPASMS 375 tablet 0  . aspirin EC 81 MG tablet Take 81 mg by mouth daily.    . Calcium Carbonate-Vitamin D (CALTRATE 600+D) 600-400 MG-UNIT per tablet Take 1 tablet by mouth 2 (two) times daily.      . Magnesium 250 MG TABS Take 1 tablet by mouth daily.     . Multiple Vitamin (MULTIVITAMIN WITH MINERALS) TABS tablet Take 1 tablet by mouth daily.    . vitamin C (ASCORBIC ACID) 250 MG tablet Take 250 mg by mouth daily.     No current facility-administered medications on file prior to visit.    Allergies  Allergen Reactions  . Sulfonamide Derivatives Anaphylaxis    caused heart to stop as child  . Erythromycin     Interacts with seizure medication  . Latex Rash    Family History  Problem Relation Age of Onset  . Heart disease Mother   . Goiter Mother     mother had i-131 rx for a benign goiter  . Heart disease Father   . Stroke Father   . Bipolar disorder Sister   . Heart attack Brother   . Bipolar disorder Brother   . Seizures  Other     Strong family history of seizures, 2 brothers and 2 sisters    BP 128/82 mmHg  Pulse 96  Temp(Src) 97.8 F (36.6 C) (Oral)  Wt 140 lb (63.504 kg)  SpO2 97%  Review of Systems Denies sob and dysphagia    Objective:   Physical Exam VITAL SIGNS:  See vs page GENERAL: no distress NECK: There is no palpable thyroid enlargement.  No thyroid nodule  is palpable.  No palpable lymphadenopathy at the anterior neck.   Lab Results  Component Value Date   TSH 1.54 09/23/2014      Assessment & Plan:  Hypothyroidism: well-replaced Small multinodular goiter, stable on [place].  She doesn't need any more [place] unless physical exam suggests growth of the goiter  Patient is advised the following: Patient Instructions  Please continue the same levothyroxine. Dr Jenny Reichmann will check your neck each year, and your thyroid blood test.   I would be happy to see you back here if necessary.

## 2014-10-20 NOTE — Patient Instructions (Addendum)
Please continue the same levothyroxine. Dr Jenny Reichmann will check your neck each year, and your thyroid blood test.   I would be happy to see you back here if necessary.

## 2014-11-11 ENCOUNTER — Other Ambulatory Visit: Payer: Self-pay | Admitting: Internal Medicine

## 2014-12-03 ENCOUNTER — Other Ambulatory Visit: Payer: Self-pay

## 2014-12-03 MED ORDER — LOSARTAN POTASSIUM 50 MG PO TABS: 50.0000 mg | ORAL_TABLET | Freq: Every day | ORAL | Status: AC

## 2014-12-20 ENCOUNTER — Telehealth: Payer: Self-pay | Admitting: Internal Medicine

## 2014-12-20 NOTE — Telephone Encounter (Signed)
Patient Name: Tammy Duke  DOB: 04-17-1940    Initial Comment Caller states she has had diarrhea since Saturday night. She is urinating. Immodium is not stopping it.   Nurse Assessment  Nurse: Raphael Gibney, RN, Vanita Ingles Date/Time (Eastern Time): 12/20/2014 12:01:32 PM  Confirm and document reason for call. If symptomatic, describe symptoms. ---Caller states she has had diarrhea since Saturday night. She is taking immodium which is not stopping the diarrhea. She is drinking fluids. Has only had plain rice and applesauce and crackers. No nausea or vomiting. has had 4 diarrhea stools since yesterday.  Has the patient traveled out of the country within the last 30 days? ---No  Does the patient require triage? ---Yes  Related visit to physician within the last 2 weeks? ---No  Does the PT have any chronic conditions? (i.e. diabetes, asthma, etc.) ---Yes  List chronic conditions. ---seizures     Guidelines    Guideline Title Affirmed Question Affirmed Notes  Diarrhea [1] MODERATE diarrhea (e.g., 4-6 times / day more than normal) AND [2] age > 68 years    Final Disposition User   See Physician within 24 Hours [place], RN, Vanita Ingles    Comments  Appt scheduled with Dr. Scarlette Calico at 10:15 am on 12/21/14.   Referrals  REFERRED TO PCP OFFICE   Disagree/Comply: Comply

## 2014-12-21 ENCOUNTER — Ambulatory Visit: Payer: Self-pay | Admitting: Internal Medicine

## 2014-12-21 ENCOUNTER — Other Ambulatory Visit (INDEPENDENT_AMBULATORY_CARE_PROVIDER_SITE_OTHER): Payer: Medicare Other

## 2014-12-21 ENCOUNTER — Ambulatory Visit (INDEPENDENT_AMBULATORY_CARE_PROVIDER_SITE_OTHER): Payer: Medicare Other | Admitting: Internal Medicine

## 2014-12-21 ENCOUNTER — Encounter: Payer: Self-pay | Admitting: Internal Medicine

## 2014-12-21 VITALS — BP 130/88 | HR 96 | Temp 97.6°F | Resp 16 | Ht 70.0 in | Wt 142.0 lb

## 2014-12-21 DIAGNOSIS — R197 Diarrhea, unspecified: Secondary | ICD-10-CM | POA: Diagnosis not present

## 2014-12-21 LAB — CBC WITH DIFFERENTIAL/PLATELET
BASOS ABS: 0 10*3/uL (ref 0.0–0.1)
Basophils Relative: 0.1 % (ref 0.0–3.0)
Eosinophils Absolute: 0 10*3/uL (ref 0.0–0.7)
Eosinophils Relative: 0.6 % (ref 0.0–5.0)
HEMATOCRIT: 44.8 % (ref 36.0–46.0)
Hemoglobin: 15.3 g/dL — ABNORMAL HIGH (ref 12.0–15.0)
LYMPHS PCT: 16 % (ref 12.0–46.0)
Lymphs Abs: 0.9 10*3/uL (ref 0.7–4.0)
MCHC: 34 g/dL (ref 30.0–36.0)
MCV: 97.6 fl (ref 78.0–100.0)
Monocytes Absolute: 0.4 10*3/uL (ref 0.1–1.0)
Monocytes Relative: 7 % (ref 3.0–12.0)
NEUTROS ABS: 4.3 10*3/uL (ref 1.4–7.7)
Neutrophils Relative %: 76.3 % (ref 43.0–77.0)
PLATELETS: 301 10*3/uL (ref 150.0–400.0)
RBC: 4.59 Mil/uL (ref 3.87–5.11)
RDW: 13.7 % (ref 11.5–15.5)
WBC: 5.6 10*3/uL (ref 4.0–10.5)

## 2014-12-21 LAB — BASIC METABOLIC PANEL
BUN: 15 mg/dL (ref 6–23)
CALCIUM: 8.9 mg/dL (ref 8.4–10.5)
CO2: 27 mEq/L (ref 19–32)
CREATININE: 0.73 mg/dL (ref 0.40–1.20)
Chloride: 89 mEq/L — ABNORMAL LOW (ref 96–112)
GFR: 83.11 mL/min (ref >60.00)
Glucose, Bld: 112 mg/dL — ABNORMAL HIGH (ref 70–99)
Potassium: 4 mEq/L (ref 3.5–5.1)
Sodium: 125 mEq/L — ABNORMAL LOW (ref 135–145)

## 2014-12-21 LAB — SEDIMENTATION RATE: Sed Rate: 6 mm/hr (ref 0–22)

## 2014-12-21 NOTE — Progress Notes (Signed)
Pre visit review using our clinic review tool, if applicable. No additional management support is needed unless otherwise documented below in the visit note. 

## 2014-12-21 NOTE — Progress Notes (Signed)
Subjective:  Patient ID: Tammy Duke, female    DOB: [birth year]  Age: 74 y.o. MRN: [id number]  CC: Diarrhea   HPI Tammy Duke presents for 3 day history of diarrhea. She is status post fistula repair that required a colostomy and then a year ago she had a colostomy takedown. She has had about 3 watery bowel movements per day. She's been taking Imodium which has provided symptom relief. She felt like as of today her diarrhea is improving. She did take a course of Keflex about 2-1/2 months ago. She describes the diarrhea as explosive but there has not been any blood or mucus.  Outpatient Prescriptions Prior to Visit  Medication Sig Dispense Refill  . alendronate (FOSAMAX) 70 MG tablet Take 1 tablet (70 mg total) by mouth every 7 (seven) days. Take with a full glass of water on an empty stomach. 12 tablet 3  . aspirin EC 81 MG tablet Take 81 mg by mouth daily.    . Calcium Carbonate-Vitamin D (CALTRATE 600+D) 600-400 MG-UNIT per tablet Take 1 tablet by mouth 2 (two) times daily.      . carbamazepine (TEGRETOL) 200 MG tablet TAKE 1 AND 1/2 TABLETS BY MOUTH TWICE DAILY AND 1 TABLET AT LUNCH AND SUPPER AS DIRECTED 450 tablet 1  . cephALEXin (KEFLEX) 500 MG capsule Take 1 capsule (500 mg total) by mouth 4 (four) times daily. 40 capsule 0  . fluticasone (FLONASE) 50 MCG/ACT nasal spray Place 2 sprays into both nostrils daily. 16 g 2  . levothyroxine (SYNTHROID, LEVOTHROID) 100 MCG tablet Take 100 mcg by mouth daily before breakfast.    . losartan (COZAAR) 50 MG tablet Take 1 tablet (50 mg total) by mouth daily. 90 tablet 1  . Magnesium 250 MG TABS Take 1 tablet by mouth daily.     . Multiple Vitamin (MULTIVITAMIN WITH MINERALS) TABS tablet Take 1 tablet by mouth daily.    . Probiotic Product (PROBIOTIC PO) Take by mouth daily. OTC    . tiZANidine (ZANAFLEX) 4 MG tablet TAKE 1 TABLET BY MOUTH EVERY 6 HOURS AS NEEDED FOR MUSCLE SPASMS 375 tablet 0  . vitamin C (ASCORBIC ACID) 250 MG  tablet Take 250 mg by mouth daily.    Marland Kitchen acetaminophen-codeine (TYLENOL #3) 300-30 MG per tablet TK 1 T PO TID PRN  1  . acetaminophen (TYLENOL) 500 MG tablet Take 1,000 mg by mouth every 6 (six) hours as needed for mild pain or moderate pain.     No facility-administered medications prior to visit.    ROS Review of Systems  Constitutional: Negative.  Negative for fever, chills, diaphoresis, activity change, appetite change, fatigue and unexpected weight change.  HENT: Negative.  Negative for trouble swallowing and voice change.   Eyes: Negative.   Respiratory: Negative.  Negative for cough, choking, chest tightness, shortness of breath and stridor.   Cardiovascular: Negative.  Negative for chest pain, palpitations and leg swelling.  Gastrointestinal: Positive for diarrhea. Negative for nausea, vomiting, abdominal pain, constipation, blood in stool, anal bleeding and rectal pain.  Endocrine: Negative.   Genitourinary: Negative.  Negative for dysuria, urgency, frequency, flank pain, decreased urine volume and difficulty urinating.  Musculoskeletal: Negative.   Skin: Negative.   Allergic/Immunologic: Negative.   Neurological: Negative.  Negative for dizziness, tremors, weakness and light-headedness.  Hematological: Negative.  Negative for adenopathy. Does not bruise/bleed easily.  Psychiatric/Behavioral: Negative.     Objective:  BP 130/88 mmHg  Pulse 96  Temp(Src) 97.6 F (36.4  C) (Oral)  Resp 16  Ht 5\' 10"  (1.778 m)  Wt 142 lb (64.411 kg)  BMI 20.37 kg/m2  SpO2 96%  BP Readings from Last 3 Encounters:  12/21/14 130/88  10/20/14 128/82  09/29/14 124/82    Wt Readings from Last 3 Encounters:  12/21/14 142 lb (64.411 kg)  10/20/14 140 lb (63.504 kg)  09/29/14 139 lb (63.05 kg)    Physical Exam  Constitutional: She is oriented to person, place, and time. She appears well-developed and well-nourished.  Non-toxic appearance. She does not have a sickly appearance. She does  not appear ill. No distress.  HENT:  Head: Normocephalic and atraumatic.  Mouth/Throat: Oropharynx is clear and moist. No oropharyngeal exudate.  Eyes: Conjunctivae are normal. Right eye exhibits no discharge. Left eye exhibits no discharge. No scleral icterus.  Neck: Normal range of motion. Neck supple. No JVD present. No tracheal deviation present. No thyromegaly present.  Cardiovascular: Normal rate, regular rhythm, normal heart sounds and intact distal pulses.  Exam reveals no gallop and no friction rub.   No murmur heard. Pulmonary/Chest: Effort normal and breath sounds normal. No stridor. No respiratory distress. She has no wheezes. She has no rales. She exhibits no tenderness.  Abdominal: Soft. Bowel sounds are normal. She exhibits no distension and no mass. There is no tenderness. There is no rebound and no guarding.  Musculoskeletal: Normal range of motion. She exhibits no edema or tenderness.  Lymphadenopathy:    She has no cervical adenopathy.  Neurological: She is oriented to person, place, and time.  Skin: Skin is warm and dry. No rash noted. She is not diaphoretic. No erythema. No pallor.  Vitals reviewed.   Lab Results  Component Value Date   WBC 6.9 09/23/2014   HGB 14.5 09/23/2014   HCT 43.0 09/23/2014   PLT 273.0 09/23/2014   GLUCOSE 92 09/23/2014   CHOL 196 09/23/2014   TRIG 65.0 09/23/2014   HDL 74.80 09/23/2014   LDLCALC 108* 09/23/2014   ALT 24 09/23/2014   AST 21 09/23/2014   NA 129* 09/23/2014   K 3.9 09/23/2014   CL 93* 09/23/2014   CREATININE 0.77 09/23/2014   BUN 16 09/23/2014   CO2 28 09/23/2014   TSH 1.54 09/23/2014   INR 0.98 02/02/2013   HGBA1C 5.6 12/01/2013    No results found.  Assessment & Plan:   Elita was seen today for diarrhea.  Diagnoses and all orders for this visit:  Diarrhea- she does not appear toxic and does appear to be improving. She most likely has viral gastroenteritis. She will continue Imodium as needed. She has  recently taken a course of antibiotics as she is at risk for Clostridium difficile infection I will therefore check her CBC to see if she has an elevated white cell count and check her stool for C. difficile toxin and lactoferrin. Will also check her electrolytes to see if she has any dehydration or electrolyte abnormality.  I have discontinued Ms. Shaft's acetaminophen-codeine. I am also having her maintain her Calcium Carbonate-Vitamin D, Magnesium, levothyroxine, multivitamin with minerals, aspirin EC, vitamin C, acetaminophen, Probiotic Product (PROBIOTIC PO), fluticasone, tiZANidine, alendronate, cephALEXin, carbamazepine, and losartan.  No orders of the defined types were placed in this encounter.     Follow-up: No Follow-up on file.  Scarlette Calico, MD

## 2014-12-21 NOTE — Patient Instructions (Signed)

## 2014-12-22 ENCOUNTER — Telehealth: Payer: Self-pay | Admitting: Internal Medicine

## 2014-12-22 ENCOUNTER — Other Ambulatory Visit: Payer: Medicare Other

## 2014-12-22 ENCOUNTER — Other Ambulatory Visit: Payer: Self-pay | Admitting: Internal Medicine

## 2014-12-22 DIAGNOSIS — R197 Diarrhea, unspecified: Secondary | ICD-10-CM

## 2014-12-22 NOTE — Telephone Encounter (Signed)
    Patient calling for lab results 

## 2014-12-23 LAB — FECAL LACTOFERRIN, QUANT: Lactoferrin: POSITIVE

## 2014-12-23 LAB — C. DIFFICILE GDH AND TOXIN A/B
C. DIFF TOXIN A/B: NOT DETECTED
C. difficile GDH: NOT DETECTED

## 2014-12-23 NOTE — Telephone Encounter (Signed)
Informed pt that results have not been released and we will call as soon as they are.

## 2015-01-06 ENCOUNTER — Ambulatory Visit: Payer: Medicare Other

## 2015-01-07 ENCOUNTER — Ambulatory Visit (INDEPENDENT_AMBULATORY_CARE_PROVIDER_SITE_OTHER): Payer: Medicare Other

## 2015-01-07 DIAGNOSIS — Z23 Encounter for immunization: Secondary | ICD-10-CM | POA: Diagnosis not present

## 2015-03-30 ENCOUNTER — Encounter: Payer: Self-pay | Admitting: Internal Medicine

## 2015-03-30 ENCOUNTER — Telehealth: Payer: Self-pay | Admitting: Internal Medicine

## 2015-03-30 ENCOUNTER — Other Ambulatory Visit: Payer: Self-pay | Admitting: General Practice

## 2015-03-30 ENCOUNTER — Other Ambulatory Visit: Payer: Self-pay | Admitting: Internal Medicine

## 2015-03-30 ENCOUNTER — Ambulatory Visit (INDEPENDENT_AMBULATORY_CARE_PROVIDER_SITE_OTHER): Payer: Medicare Other | Admitting: Internal Medicine

## 2015-03-30 ENCOUNTER — Ambulatory Visit (INDEPENDENT_AMBULATORY_CARE_PROVIDER_SITE_OTHER): Payer: Medicare Other | Admitting: General Practice

## 2015-03-30 VITALS — BP 128/82 | HR 99 | Temp 98.0°F | Ht 70.0 in | Wt 147.0 lb

## 2015-03-30 DIAGNOSIS — I4892 Unspecified atrial flutter: Secondary | ICD-10-CM

## 2015-03-30 DIAGNOSIS — E785 Hyperlipidemia, unspecified: Secondary | ICD-10-CM | POA: Diagnosis not present

## 2015-03-30 DIAGNOSIS — I499 Cardiac arrhythmia, unspecified: Secondary | ICD-10-CM | POA: Diagnosis not present

## 2015-03-30 DIAGNOSIS — I10 Essential (primary) hypertension: Secondary | ICD-10-CM | POA: Diagnosis not present

## 2015-03-30 DIAGNOSIS — I4891 Unspecified atrial fibrillation: Secondary | ICD-10-CM

## 2015-03-30 DIAGNOSIS — E039 Hypothyroidism, unspecified: Secondary | ICD-10-CM

## 2015-03-30 DIAGNOSIS — Z5181 Encounter for therapeutic drug level monitoring: Secondary | ICD-10-CM

## 2015-03-30 DIAGNOSIS — Z0189 Encounter for other specified special examinations: Secondary | ICD-10-CM

## 2015-03-30 DIAGNOSIS — Z0000 Encounter for general adult medical examination without abnormal findings: Secondary | ICD-10-CM

## 2015-03-30 LAB — POCT INR: INR: 1

## 2015-03-30 MED ORDER — CARVEDILOL 3.125 MG PO TABS: 3.1250 mg | ORAL_TABLET | Freq: Two times a day (BID) | ORAL | Status: DC

## 2015-03-30 MED ORDER — WARFARIN SODIUM 5 MG PO TABS: ORAL_TABLET | ORAL | Status: DC

## 2015-03-30 NOTE — Patient Instructions (Signed)

## 2015-03-30 NOTE — Telephone Encounter (Signed)
Pt advised that she will be contacted and should take next available appt. Pt expressed understanding

## 2015-03-30 NOTE — Assessment & Plan Note (Signed)
stable overall by history and exam, recent data reviewed with pt, and pt to continue medical treatment as before,  to f/u any worsening symptoms or concerns Lab Results  Component Value Date   LDLCALC 108* 09/23/2014    

## 2015-03-30 NOTE — Assessment & Plan Note (Signed)
stable overall by history and exam, recent data reviewed with pt, and pt to continue medical treatment as before,  to f/u any worsening symptoms or concerns Lab Results  Component Value Date   TSH 1.54 09/23/2014    

## 2015-03-30 NOTE — Progress Notes (Signed)
I have reviewed and agree with the plan. 

## 2015-03-30 NOTE — Progress Notes (Signed)
Subjective:    Patient ID: Tammy Duke, female    DOB: 1940/12/26, 74 y.o.   MRN: [id number]  HPI  Here to f/u; overall doing ok,  Pt denies chest pain, increasing sob or doe, wheezing, orthopnea, PND, increased LE swelling, palpitations, dizziness or syncope.  Pt denies new neurological symptoms such as new headache, or facial or extremity weakness or numbness.  Pt denies polydipsia, polyuria, or low sugar episode.   Pt denies new neurological symptoms such as new headache, or facial or extremity weakness or numbness.   Pt states overall good compliance with meds, mostly trying to follow appropriate diet, with wt overall stable,  but little exercise however.  No neck swelling or lumps, no longer seeing Dr Loanne Drilling as has been stable for long time. Past Medical History  Diagnosis Date  . GOITER, MULTINODULAR 05/04/2009  . HYPOTHYROIDISM 04/21/2009  . HYPERTENSION 04/21/2009  . ALLERGIC RHINITIS 04/21/2009  . SYNCOPE 01/02/2010  . FATIGUE 04/21/2009  . THYROID CANCER, HX OF 04/21/2009  . Osteoporosis 10/28/2011  . Environmental allergies   . Degenerative arthritis of right knee 09/24/2012  . Colonic fistula   . Cancer ([place]) 1984    thyroid  . SEIZURE DISORDER     "under control"  . Headache(784.0)     hx of  . H/O: whooping cough 74 years old  . GERD (gastroesophageal reflux disease)     "tums controls it"  . Back spasm   . Personal history of arthritis   . Other malignant neoplasm without specification of site   . Menopausal syndrome    Past Surgical History  Procedure Laterality Date  . S/p left radius fracture surgury  2001    Dr. Rhona Raider, Ortho.  . S/p thyroid surgury for cancer  1985    biopsy--PARTIAL THYROIDECTOMY  . Hx of right retenal detachment   Sept. 2005    partial  . S/p right cataract with lens implant    . Laparoscopic sigmoid colectomy  01/30/2012    Procedure: LAPAROSCOPIC SIGMOID COLECTOMY;  Surgeon: Pedro Earls, MD;  Location: [facility];  Service: General;   Laterality: N/A;  Lap Assisted Sigmoid Colectomy with Takedown of Colovesicle Fistula  . Take down of intestinal fistula  01/30/2012    Procedure: TAKE DOWN OF INTESTINAL FISTULA;  Surgeon: Pedro Earls, MD;  Location: [facility];  Service: General;  Laterality: N/A;  . Vesico-vaginal fistula repair  01/30/2012    Procedure: FISTULA REPAIR VESICO-VAGINAL;  Surgeon: Reece Packer, MD;  Location: [facility];  Service: Urology;  Laterality: N/A;  Enterovesical Fistula Repair  . Colon surgery    . Total knee arthroplasty Right 02/10/2013    Procedure: TOTAL KNEE ARTHROPLASTY;  Surgeon: Hessie Dibble, MD;  Location: [facility];  Service: Orthopedics;  Laterality: Right;  . Fracture surgery      left arm  . Colon resection N/A 07/28/2013    Procedure: LAPAROSCOPIC  SPLENIC FLEXURE mobilizationTAKEDOWN inner enterCOLOVESICLE FISTULAr  , lysis of adhesions.hartman procedure, partial salpingoectomey,   cystoscopy ;  Surgeon: Pedro Earls, MD;  Location: [facility];  Service: General;  Laterality: N/A;  . Vesico-vaginal fistula repair Left 07/28/2013    Procedure: cystoscopy;  Surgeon: Reece Packer, MD;  Location: [facility];  Service: Urology;  Laterality: Left;  . Colostomy takedown N/A 12/04/2013    Procedure: COLOSTOMY TAKEDOWN (HARTMAN)/takedown of Hartman pouch and end colostomy;  Surgeon: Kaylyn Lim, MD;  Location: [facility];  Service: General;  Laterality: N/A;  reports that she quit smoking about 33 years ago. Her smoking use included Cigarettes. She quit after 10 years of use. She has never used smokeless tobacco. She reports that she does not drink alcohol or use illicit drugs. family history includes Bipolar disorder in her brother and sister; Goiter in her mother; Heart attack in her brother; Heart disease in her father and mother; Seizures in her other; Stroke in her father. Allergies  Allergen Reactions  . Sulfonamide Derivatives Anaphylaxis    caused heart to stop as child  . Erythromycin      Interacts with seizure medication  . Latex Rash   Current Outpatient Prescriptions on File Prior to Visit  Medication Sig Dispense Refill  . acetaminophen (TYLENOL) 500 MG tablet Take 1,000 mg by mouth every 6 (six) hours as needed for mild pain or moderate pain.    Marland Kitchen alendronate (FOSAMAX) 70 MG tablet Take 1 tablet (70 mg total) by mouth every 7 (seven) days. Take with a full glass of water on an empty stomach. 12 tablet 3  . aspirin EC 81 MG tablet Take 81 mg by mouth daily.    . Calcium Carbonate-Vitamin D (CALTRATE 600+D) 600-400 MG-UNIT per tablet Take 1 tablet by mouth 2 (two) times daily.      . carbamazepine (TEGRETOL) 200 MG tablet TAKE 1 AND 1/2 TABLETS BY MOUTH TWICE DAILY AND 1 TABLET AT LUNCH AND SUPPER AS DIRECTED 450 tablet 1  . fluticasone (FLONASE) 50 MCG/ACT nasal spray Place 2 sprays into both nostrils daily. 16 g 2  . levothyroxine (SYNTHROID, LEVOTHROID) 100 MCG tablet Take 100 mcg by mouth daily before breakfast.    . losartan (COZAAR) 50 MG tablet Take 1 tablet (50 mg total) by mouth daily. 90 tablet 1  . Magnesium 250 MG TABS Take 1 tablet by mouth daily.     . Multiple Vitamin (MULTIVITAMIN WITH MINERALS) TABS tablet Take 1 tablet by mouth daily.    . Probiotic Product (PROBIOTIC PO) Take by mouth daily. OTC    . tiZANidine (ZANAFLEX) 4 MG tablet TAKE 1 TABLET BY MOUTH EVERY 6 HOURS AS NEEDED FOR MUSCLE SPASMS 375 tablet 0   No current facility-administered medications on file prior to visit.   Review of Systems  Constitutional: Negative for unusual diaphoresis or night sweats HENT: Negative for ringing in ear or discharge Eyes: Negative for double vision or worsening visual disturbance.  Respiratory: Negative for choking and stridor.   Gastrointestinal: Negative for vomiting or other signifcant bowel change Genitourinary: Negative for hematuria or change in urine volume.  Musculoskeletal: Negative for other MSK pain or swelling Skin: Negative for color change  and worsening wound.  Neurological: Negative for tremors and numbness other than noted  Psychiatric/Behavioral: Negative for decreased concentration or agitation other than above       Objective:   Physical Exam BP 128/82 mmHg  Pulse 99  Temp(Src) 98 F (36.7 C) (Oral)  Ht 5\' 10"  (1.778 m)  Wt 147 lb (66.679 kg)  BMI 21.09 kg/m2  SpO2 97% VS noted,  Constitutional: Pt appears in no significant distress HENT: Head: NCAT.  Right Ear: External ear normal.  Left Ear: External ear normal.  + multinod goiter, no signif change Eyes: . Pupils are equal, round, and reactive to light. Conjunctivae and EOM are normal Neck: Normal range of motion. Neck supple.  Cardiovascular: Normal rate and ? regular rhythm.  but has either ectopy vs irreg rhythm  Pulmonary/Chest: Effort normal and breath sounds without  rales or wheezing.  Abd:  Soft, NT, ND, + BS Neurological: Pt is alert. Not confused , motor grossly intact Skin: Skin is warm. No rash, no LE edema Psychiatric: Pt behavior is normal. No agitation.   <ECG>  ECG reviewed as per emr - new afib/flutter, HR 95     Assessment & Plan:

## 2015-03-30 NOTE — Assessment & Plan Note (Addendum)
ECG reviewed as per emr, new onset unknown time of onset, asympt and unaware, rate relatively well controlled, to start coreg 3.125 bid, stop asa, start coumadin per coumadin clinic today, for echo, and refer cardiology  Note:  Total time for pt hx, exam, review of record with pt in the room, determination of diagnoses and plan for further eval and tx is > 40 min, with over 50% spent in coordination and counseling of patient

## 2015-03-30 NOTE — Telephone Encounter (Signed)
Would like to know if she needs to make appointment right away with heart care or does she need to wait until after she has her first coum check to make appointment with heart care?  Please follow back up with patient.

## 2015-03-30 NOTE — Patient Instructions (Addendum)
Your EKG was consisitent with a new heart rhythm problem called atrial fibrillation  Please STOP the aspirin  Please take all new medication as prescribed - the coreg (generic) at 3.125 mg twice per day)  You should also start Coumadin with the coumadin clinic today  Please continue all other medications as before, and refills have been done if requested.  Please have the pharmacy call with any other refills you may need.  Please continue your efforts at being more active, low cholesterol diet, and weight control.  You are otherwise up to date with prevention measures today.  You will be contacted regarding the referral for: Echocardiogram, and Cardiology  Please keep your appointments with your specialists as you may have planned  Please return in 6 months, or sooner if needed, with Lab testing done 3-5 days before]

## 2015-03-30 NOTE — Assessment & Plan Note (Signed)
stable overall by history and exam, recent data reviewed with pt, and pt to continue medical treatment as before,  to f/u any worsening symptoms or concerns BP Readings from Last 3 Encounters:  03/30/15 128/82  12/21/14 130/88  10/20/14 128/82

## 2015-03-30 NOTE — Progress Notes (Signed)
Pre visit review using our clinic review tool, if applicable. No additional management support is needed unless otherwise documented below in the visit note. 

## 2015-03-30 NOTE — Assessment & Plan Note (Signed)
New finding, ? Ectopy vs afib - for ecg today

## 2015-04-04 ENCOUNTER — Other Ambulatory Visit: Payer: Self-pay | Admitting: Internal Medicine

## 2015-04-05 NOTE — Telephone Encounter (Signed)
Tizanidine done erx 

## 2015-04-06 ENCOUNTER — Ambulatory Visit (INDEPENDENT_AMBULATORY_CARE_PROVIDER_SITE_OTHER): Payer: Medicare Other

## 2015-04-06 ENCOUNTER — Ambulatory Visit: Payer: Medicare Other | Admitting: General Practice

## 2015-04-06 VITALS — BP 146/90 | HR 98 | Ht 71.0 in | Wt 149.5 lb

## 2015-04-06 DIAGNOSIS — Z0000 Encounter for general adult medical examination without abnormal findings: Secondary | ICD-10-CM | POA: Diagnosis not present

## 2015-04-06 DIAGNOSIS — I4891 Unspecified atrial fibrillation: Secondary | ICD-10-CM

## 2015-04-06 DIAGNOSIS — Z5181 Encounter for therapeutic drug level monitoring: Secondary | ICD-10-CM

## 2015-04-06 LAB — POCT INR: INR: 1

## 2015-04-06 NOTE — Progress Notes (Addendum)
Subjective:   Tammy Duke is a 75 y.o. female who presents for Medicare Annual (Subsequent) preventive examination.  Review of Systems:   HRA assessment completed during visit; Valentina Lucks The Patient was informed that this wellness visit is to identify risk and educate on how to reduce risk for increase disease through lifestyle changes.   ROS deferred to CPE exam with physician  Medical issues: syncope; r knee DJD  Osteoporosis; on medication and does weight bearing exercise Seizure c/o Colectomy; 2015;  Hyperlipidemia 01/2015; chol 196; Trig; 65; HDL 74; LDL 108 A1c 5.6  BMI: 22 Diet; normally eat high fiber; 35 to 40 gm; high fiber bread Was instructed not to eat greens; eat infrequent moderate amounts; 2 to 3 times a week  Mostly cook at home; go out 1 or 2 times a week;  Lunch; cottage cheese and fruit; yogurt and fruit; vegetable soup Supper; based on what she had for lunch  Exercise; does exercise on TV at 8am; 46minutes; weight bearing; use can of soup  SAFETY/ took rugs up; doesn't use walker at home;  Safety reviewed for the home; clear paths through the home, eliminating clutter, railing as needed; bathroom safety; has transfer bench; community safety; does not walk by herself outside;  smoke detectors and firearms safety reviewed Driving accidents and seatbelt; hasn't driven since seizures  Did have to walk 4 to 5 blocks to catch a bus; friends check on her and offer to transport  Sun protection: not out in the sun   Stressors; no stresses reported Medication review/ last year compression fx to back;  Coreg since 03/2015; and fup with cardiologist on the 20th  No bleeding from warfarin but some minor bruising   Fall assessment no Gait assessment; walks with walker; started when she fx back last year Does not have to use at home  Mobilization and Functional losses in the last year./ no Sleep patterns well; goes to bathroom at hs and does have  night light;  Cell phone  Urinary or fecal incontinence reviewed;   Counseling: Colonoscopy; 05/2009/ repeat 05/2019 EKG: 02/02/2013/03/2015 Hearing: 2000hz  both ears Dexa: 09/06/2011 lowest t score -2.7; fup dexa in 25 months; taking fosomax; calcium has vit d  Mammagram; 09/22/2014 Dental; have teeth checked regularly Ophthalmology exam; in June / partially detached retina;  Immunizations Due: nothing due  Current Care Team reviewed and updated     Objective:      Vitals: BP 146/90 mmHg  Pulse 98  Ht 5\' 11"  (1.803 m)  Wt 149 lb 8 oz (67.813 kg)  BMI 20.86 kg/m2  SpO2 98%   Checks BP at home and states it is lower during the day; Is always higher in the am  Tobacco History  Smoking status  . Former Smoker -- 10 years  . Types: Cigarettes  . Quit date: 04/02/1982  Smokeless tobacco  . Never Used    Comment: 1 pack would last 2 weeks     Counseling given: Not Answered  Counseling was given; sh  Past Medical History  Diagnosis Date  . GOITER, MULTINODULAR 05/04/2009  . HYPOTHYROIDISM 04/21/2009  . HYPERTENSION 04/21/2009  . ALLERGIC RHINITIS 04/21/2009  . SYNCOPE 01/02/2010  . FATIGUE 04/21/2009  . THYROID CANCER, HX OF 04/21/2009  . Osteoporosis 10/28/2011  . Environmental allergies   . Degenerative arthritis of right knee 09/24/2012  . Colonic fistula   . Cancer ([place]) 1984    thyroid  . SEIZURE DISORDER     "under  control"  . Headache(784.0)     hx of  . H/O: whooping cough 75 years old  . GERD (gastroesophageal reflux disease)     "tums controls it"  . Back spasm   . Personal history of arthritis   . Other malignant neoplasm without specification of site   . Menopausal syndrome    Past Surgical History  Procedure Laterality Date  . S/p left radius fracture surgury  2001    Dr. Rhona Raider, Ortho.  . S/p thyroid surgury for cancer  1985    biopsy--PARTIAL THYROIDECTOMY  . Hx of right retenal detachment   Sept. 2005    partial  . S/p right cataract with  lens implant    . Laparoscopic sigmoid colectomy  01/30/2012    Procedure: LAPAROSCOPIC SIGMOID COLECTOMY;  Surgeon: Pedro Earls, MD;  Location: [facility];  Service: General;  Laterality: N/A;  Lap Assisted Sigmoid Colectomy with Takedown of Colovesicle Fistula  . Take down of intestinal fistula  01/30/2012    Procedure: TAKE DOWN OF INTESTINAL FISTULA;  Surgeon: Pedro Earls, MD;  Location: [facility];  Service: General;  Laterality: N/A;  . Vesico-vaginal fistula repair  01/30/2012    Procedure: FISTULA REPAIR VESICO-VAGINAL;  Surgeon: Reece Packer, MD;  Location: [facility];  Service: Urology;  Laterality: N/A;  Enterovesical Fistula Repair  . Colon surgery    . Total knee arthroplasty Right 02/10/2013    Procedure: TOTAL KNEE ARTHROPLASTY;  Surgeon: Hessie Dibble, MD;  Location: [place];  Service: Orthopedics;  Laterality: Right;  . Fracture surgery      left arm  . Colon resection N/A 07/28/2013    Procedure: LAPAROSCOPIC  SPLENIC FLEXURE mobilizationTAKEDOWN inner enterCOLOVESICLE FISTULAr  , lysis of adhesions.hartman procedure, partial salpingoectomey,   cystoscopy ;  Surgeon: Pedro Earls, MD;  Location: [facility];  Service: General;  Laterality: N/A;  . Vesico-vaginal fistula repair Left 07/28/2013    Procedure: cystoscopy;  Surgeon: Reece Packer, MD;  Location: [facility];  Service: Urology;  Laterality: Left;  . Colostomy takedown N/A 12/04/2013    Procedure: COLOSTOMY TAKEDOWN (HARTMAN)/takedown of Hartman pouch and end colostomy;  Surgeon: Kaylyn Lim, MD;  Location: [facility];  Service: General;  Laterality: N/A;   Family History  Problem Relation Age of Onset  . Heart disease Mother   . Goiter Mother     mother had i-131 rx for a benign goiter  . Heart disease Father   . Stroke Father   . Bipolar disorder Sister   . Heart attack Brother   . Bipolar disorder Brother   . Seizures Other     Strong family history of seizures, 2 brothers and 2 sisters   History  Sexual  Activity  . Sexual Activity: No    Outpatient Encounter Prescriptions as of 04/06/2015  Medication Sig  . acetaminophen (TYLENOL) 500 MG tablet Take 1,000 mg by mouth every 6 (six) hours as needed for mild pain or moderate pain.  Marland Kitchen alendronate (FOSAMAX) 70 MG tablet Take 1 tablet (70 mg total) by mouth every 7 (seven) days. Take with a full glass of water on an empty stomach.  . Calcium Carbonate-Vitamin D (CALTRATE 600+D) 600-400 MG-UNIT per tablet Take 1 tablet by mouth 2 (two) times daily.    . carbamazepine (TEGRETOL) 200 MG tablet TAKE 1 AND 1/2 TABLETS BY MOUTH TWICE DAILY AND 1 TABLET AT LUNCH AND SUPPER AS DIRECTED  . carvedilol (COREG) 3.125 MG tablet Take 1 tablet (3.125  mg total) by mouth 2 (two) times daily with a meal.  . fluticasone (FLONASE) 50 MCG/ACT nasal spray Place 2 sprays into both nostrils daily.  Marland Kitchen levothyroxine (SYNTHROID, LEVOTHROID) 100 MCG tablet Take 100 mcg by mouth daily before breakfast.  . losartan (COZAAR) 50 MG tablet Take 1 tablet (50 mg total) by mouth daily.  . Magnesium 250 MG TABS Take 1 tablet by mouth daily.   . Probiotic Product (PROBIOTIC PO) Take by mouth daily. OTC  . tiZANidine (ZANAFLEX) 4 MG tablet TAKE 1 TABLET BY MOUTH EVERY 6 HOURS AS NEEDED FOR MUSCLE SPASMS  . tiZANidine (ZANAFLEX) 4 MG tablet TAKE 1 TABLET BY MOUTH EVERY 6 HOURS AS NEEDED FOR MUSCLE SPASMS  . warfarin (COUMADIN) 5 MG tablet TAKE 1 TABLET BY MOUTH EVERY DAY OR AS DIRECTED BY ANTICOAGULATION CLINIC  . Multiple Vitamin (MULTIVITAMIN WITH MINERALS) TABS tablet Take 1 tablet by mouth daily. Reported on 04/06/2015   No facility-administered encounter medications on file as of 04/06/2015.    Activities of Daily Living In your present state of health, do you have any difficulty performing the following activities: 04/06/2015  Hearing? N  Vision? N  Difficulty concentrating or making decisions? N  Walking or climbing stairs? Y  Dressing or bathing? N  Doing errands, shopping? N    Preparing Food and eating ? N  Using the Toilet? N  In the past six months, have you accidently leaked urine? N  Do you have problems with loss of bowel control? N  Managing your Medications? N  Managing your Finances? N  Housekeeping or managing your Housekeeping? N    Patient Care Team: Biagio Borg, MD as PCP - General    Assessment:    Assessment   Patient presents for yearly preventative medicine examination. Medicare questionnaire screening were completed, i.e. 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 No No No No   Depression Screen PHQ 2/9 Scores 04/06/2015 09/29/2014 09/22/2013 03/11/2013  PHQ - 2 Score 0 0 0 0     Cognitive Testing MMSE - Mini Mental State Exam 04/06/2015  Not completed: (No Data)    Immunization History  Administered Date(s) Administered  . Influenza Split 01/09/2011, 01/16/2012  . Influenza Whole 12/31/2008  . Influenza, High Dose Seasonal PF 01/07/2015  . Influenza,inj,Quad PF,36+ Mos  12/25/2012, 11/24/2013  . Pneumococcal Conjugate-13 03/11/2013  . Pneumococcal Polysaccharide-23 07/01/2008  . Td 07/01/2008  . Zoster 07/02/2007   Screening Tests Health Maintenance  Topic Date Due  . INFLUENZA VACCINE  11/01/2015  .  MAMMOGRAM  09/21/2016  . TETANUS/TDAP  07/02/2018  . COLONOSCOPY  05/27/2019  . DEXA SCAN  Completed  . ZOSTAVAX  Completed  . PNA vac Low Risk Adult  Completed      Plan:   During the course of the visit the patient was educated and counseled about the following appropriate screening and preventive services:   Vaccines to include Pneumoccal, Influenza, Hepatitis B, Td, Zostavax, HCV/ no needs  Electrocardiogram/ recently completed by Dr. Jenny Reichmann; tol Coreg at this time; Rate continues at 98 and slightly irregular with no c/o of dizziness; chest pain or other/ Is not aware of irregular beats.   Cardiovascular Disease ; BP slightly elevated; is < 140/ 80 when checked at home  Colorectal cancer screening/ 05/2019  Bone density screening/ taking fosamax and tol well   Diabetes screening. n/a  Glaucoma screening/ June and no issues   Mammography/ 09/2014 and no issues  /utrition counseling / eats high fiber; cooks most of her meals; Tries to eat healthy; Adjusting to eating less greens. BMI is normal   Patient Instructions (the written plan) was given to the patient.   [id number], RN  04/06/2015   Medical screening examination/treatment/procedure(s) were performed by non-physician practitioner and as supervising physician I was immediately available for consultation/collaboration. I agree with above. Cathlean Cower, MD

## 2015-04-06 NOTE — Patient Instructions (Addendum)
Tammy Duke , Thank you for taking time to come for your Medicare Wellness Visit. I appreciate your ongoing commitment to your health goals. Please review the following plan we discussed and let me know if I can assist you in the future.    These are the goals we discussed: Goals    . 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 Flu Shot  11/01/2015  . Mammogram  09/21/2016  . Tetanus Vaccine  07/02/2018  . Colon Cancer Screening  05/27/2019  . DEXA scan (bone density measurement)  Completed  . Shingles Vaccine  Completed  . Pneumonia vaccines  Completed     Fall Prevention in the Home  Falls can cause injuries. They can happen to people of all ages. There are many things you can do to make your home safe and to help prevent falls.  WHAT CAN I DO ON THE OUTSIDE OF MY HOME?  Regularly fix the edges of walkways and driveways and fix any cracks.  Remove anything that might make you trip as you walk through a door, such as a raised step or threshold.  Trim any bushes or trees on the path to your home.  Use bright outdoor lighting.  Clear any walking paths of anything that might make someone trip, such as rocks or tools.  Regularly check to see if handrails are loose or broken. Make sure that both sides of any steps have handrails.  Any raised decks and porches should have guardrails on the edges.  Have any leaves, snow, or ice cleared regularly.  Use sand or salt on walking paths during winter.  Clean up any spills in your garage right away. This includes oil or grease spills. WHAT CAN I DO IN THE BATHROOM?   Use night lights.  Install grab bars by the toilet and in the tub and shower. Do not use towel bars as grab bars.  Use non-skid mats or decals in the tub or  shower.  If you need to sit down in the shower, use a plastic, non-slip stool.  Keep the floor dry. Clean up any water that spills on the floor as soon as it happens.  Remove soap buildup in the tub or shower regularly.  Attach bath mats securely with double-sided non-slip rug tape.  Do not have throw rugs and other things on the floor that can make you trip. WHAT CAN I DO IN THE BEDROOM?  Use night lights.  Make sure that you have a light by your bed that is easy to reach.  Do not use any sheets or blankets that are too big for your bed. They should not hang down onto the floor.  Have a firm chair that has side arms. You can use this for support while you get dressed.  Do not have throw rugs and other things on the floor that can make you trip. WHAT CAN I DO IN THE KITCHEN?  Clean up any spills right away.  Avoid walking on wet floors.  Keep items that you use a lot in easy-to-reach places.  If you need to reach something above you, use a strong step stool that has a grab bar.  Keep electrical cords out of the  way.  Do not use floor polish or wax that makes floors slippery. If you must use wax, use non-skid floor wax.  Do not have throw rugs and other things on the floor that can make you trip. WHAT CAN I DO WITH MY STAIRS?  Do not leave any items on the stairs.  Make sure that there are handrails on both sides of the stairs and use them. Fix handrails that are broken or loose. Make sure that handrails are as long as the stairways.  Check any carpeting to make sure that it is firmly attached to the stairs. Fix any carpet that is loose or worn.  Avoid having throw rugs at the top or bottom of the stairs. If you do have throw rugs, attach them to the floor with carpet tape.  Make sure that you have a light switch at the top of the stairs and the bottom of the stairs. If you do not have them, ask someone to add them for you. WHAT ELSE CAN I DO TO HELP PREVENT  FALLS?  Wear shoes that:  Do not have high heels.  Have rubber bottoms.  Are comfortable and fit you well.  Are closed at the toe. Do not wear sandals.  If you use a stepladder:  Make sure that it is fully opened. Do not climb a closed stepladder.  Make sure that both sides of the stepladder are locked into place.  Ask someone to hold it for you, if possible.  Clearly mark and make sure that you can see:  Any grab bars or handrails.  First and last steps.  Where the edge of each step is.  Use tools that help you move around (mobility aids) if they are needed. These include:  Canes.  Walkers.  Scooters.  Crutches.  Turn on the lights when you go into a dark area. Replace any light bulbs as soon as they burn out.  Set up your furniture so you have a clear path. Avoid moving your furniture around.  If any of your floors are uneven, fix them.  If there are any pets around you, be aware of where they are.  Review your medicines with your doctor. Some medicines can make you feel dizzy. This can increase your chance of falling. Ask your doctor what other things that you can do to help prevent falls.   This information is not intended to replace advice given to you by your health care provider. Make sure you discuss any questions you have with your health care provider.   Document Released: 01/13/2009 Document Revised: 08/03/2014 Document Reviewed: 04/23/2014 Elsevier Interactive Patient Education 2016 Veteran Maintenance, Female Adopting a healthy lifestyle and getting preventive care can go a long way to promote health and wellness. Talk with your health care provider about what schedule of regular examinations is right for you. This is a good chance for you to check in with your provider about disease prevention and staying healthy. In between checkups, there are plenty of things you can do on your own. Experts have done a lot of research about which  lifestyle changes and preventive measures are most likely to keep you healthy. Ask your health care provider for more information. WEIGHT AND DIET  Eat a healthy diet  Be sure to include plenty of vegetables, fruits, low-fat dairy products, and lean protein.  Do not eat a lot of foods high in solid fats, added sugars, or salt.  Get regular exercise. This  is one of the most important things you can do for your health.  Most adults should exercise for at least 150 minutes each week. The exercise should increase your heart rate and make you sweat (moderate-intensity exercise).  Most adults should also do strengthening exercises at least twice a week. This is in addition to the moderate-intensity exercise.  Maintain a healthy weight  Body mass index (BMI) is a measurement that can be used to identify possible weight problems. It estimates body fat based on height and weight. Your health care provider can help determine your BMI and help you achieve or maintain a healthy weight.  For females 7 years of age and older:   A BMI below 18.5 is considered underweight.  A BMI of 18.5 to 24.9 is normal.  A BMI of 25 to 29.9 is considered overweight.  A BMI of 30 and above is considered obese.  Watch levels of cholesterol and blood lipids  You should start having your blood tested for lipids and cholesterol at 74 years of age, then have this test every 5 years.  You may need to have your cholesterol levels checked more often if:  Your lipid or cholesterol levels are high.  You are older than 74 years of age.  You are at high risk for heart disease.  CANCER SCREENING   Lung Cancer  Lung cancer screening is recommended for adults 34-64 years old who are at high risk for lung cancer because of a history of smoking.  A yearly low-dose CT scan of the lungs is recommended for people who:  Currently smoke.  Have quit within the past 15 years.  Have at least a 30-pack-year history of  smoking. A pack year is smoking an average of one pack of cigarettes a day for 1 year.  Yearly screening should continue until it has been 15 years since you quit.  Yearly screening should stop if you develop a health problem that would prevent you from having lung cancer treatment.  Breast Cancer  Practice breast self-awareness. This means understanding how your breasts normally appear and feel.  It also means doing regular breast self-exams. Let your health care provider know about any changes, no matter how small.  If you are in your 20s or 30s, you should have a clinical breast exam (CBE) by a health care provider every 1-3 years as part of a regular health exam.  If you are 34 or older, have a CBE every year. Also consider having a breast X-ray (mammogram) every year.  If you have a family history of breast cancer, talk to your health care provider about genetic screening.  If you are at high risk for breast cancer, talk to your health care provider about having an MRI and a mammogram every year.  Breast cancer gene (BRCA) assessment is recommended for women who have family members with BRCA-related cancers. BRCA-related cancers include:  Breast.  Ovarian.  Tubal.  Peritoneal cancers.  Results of the assessment will determine the need for genetic counseling and BRCA1 and BRCA2 testing. Cervical Cancer Your health care provider may recommend that you be screened regularly for cancer of the pelvic organs (ovaries, uterus, and vagina). This screening involves a pelvic examination, including checking for microscopic changes to the surface of your cervix (Pap test). You may be encouraged to have this screening done every 3 years, beginning at age 60.  For women ages 56-65, health care providers may recommend pelvic exams and Pap testing every 3  years, or they may recommend the Pap and pelvic exam, combined with testing for human papilloma virus (HPV), every 5 years. Some types of  HPV increase your risk of cervical cancer. Testing for HPV may also be done on women of any age with unclear Pap test results.  Other health care providers may not recommend any screening for nonpregnant women who are considered low risk for pelvic cancer and who do not have symptoms. Ask your health care provider if a screening pelvic exam is right for you.  If you have had past treatment for cervical cancer or a condition that could lead to cancer, you need Pap tests and screening for cancer for at least 20 years after your treatment. If Pap tests have been discontinued, your risk factors (such as having a new sexual partner) need to be reassessed to determine if screening should resume. Some women have medical problems that increase the chance of getting cervical cancer. In these cases, your health care provider may recommend more frequent screening and Pap tests. Colorectal Cancer  This type of cancer can be detected and often prevented.  Routine colorectal cancer screening usually begins at 74 years of age and continues through 74 years of age.  Your health care provider may recommend screening at an earlier age if you have risk factors for colon cancer.  Your health care provider may also recommend using home test kits to check for hidden blood in the stool.  A small camera at the end of a tube can be used to examine your colon directly (sigmoidoscopy or colonoscopy). This is done to check for the earliest forms of colorectal cancer.  Routine screening usually begins at age 26.  Direct examination of the colon should be repeated every 5-10 years through 74 years of age. However, you may need to be screened more often if early forms of precancerous polyps or small growths are found. Skin Cancer  Check your skin from head to toe regularly.  Tell your health care provider about any new moles or changes in moles, especially if there is a change in a mole's shape or color.  Also tell your  health care provider if you have a mole that is larger than the size of a pencil eraser.  Always use sunscreen. Apply sunscreen liberally and repeatedly throughout the day.  Protect yourself by wearing long sleeves, pants, a wide-brimmed hat, and sunglasses whenever you are outside. HEART DISEASE, DIABETES, AND HIGH BLOOD PRESSURE   High blood pressure causes heart disease and increases the risk of stroke. High blood pressure is more likely to develop in:  People who have blood pressure in the high end of the normal range (130-139/85-89 mm Hg).  People who are overweight or obese.  People who are African American.  If you are 32-18 years of age, have your blood pressure checked every 3-5 years. If you are 64 years of age or older, have your blood pressure checked every year. You should have your blood pressure measured twice--once when you are at a hospital or clinic, and once when you are not at a hospital or clinic. Record the average of the two measurements. To check your blood pressure when you are not at a hospital or clinic, you can use:  An automated blood pressure machine at a pharmacy.  A home blood pressure monitor.  If you are between 48 years and 61 years old, ask your health care provider if you should take aspirin to prevent strokes.  Have regular diabetes screenings. This involves taking a blood sample to check your fasting blood sugar level.  If you are at a normal weight and have a low risk for diabetes, have this test once every three years after 74 years of age.  If you are overweight and have a high risk for diabetes, consider being tested at a younger age or more often. PREVENTING INFECTION  Hepatitis B  If you have a higher risk for hepatitis B, you should be screened for this virus. You are considered at high risk for hepatitis B if:  You were born in a country where hepatitis B is common. Ask your health care provider which countries are considered high  risk.  Your parents were born in a high-risk country, and you have not been immunized against hepatitis B (hepatitis B vaccine).  You have HIV or AIDS.  You use needles to inject street drugs.  You live with someone who has hepatitis B.  You have had sex with someone who has hepatitis B.  You get hemodialysis treatment.  You take certain medicines for conditions, including cancer, organ transplantation, and autoimmune conditions. Hepatitis C  Blood testing is recommended for:  Everyone born from [birth year] through 1964.  Anyone with known risk factors for hepatitis C. Sexually transmitted infections (STIs)  You should be screened for sexually transmitted infections (STIs) including gonorrhea and chlamydia if:  You are sexually active and are younger than 74 years of age.  You are older than 74 years of age and your health care provider tells you that you are at risk for this type of infection.  Your sexual activity has changed since you were last screened and you are at an increased risk for chlamydia or gonorrhea. Ask your health care provider if you are at risk.  If you do not have HIV, but are at risk, it may be recommended that you take a prescription medicine daily to prevent HIV infection. This is called pre-exposure prophylaxis (PrEP). You are considered at risk if:  You are sexually active and do not regularly use condoms or know the HIV status of your partner(s).  You take drugs by injection.  You are sexually active with a partner who has HIV. Talk with your health care provider about whether you are at high risk of being infected with HIV. If you choose to begin PrEP, you should first be tested for HIV. You should then be tested every 3 months for as long as you are taking PrEP.  PREGNANCY   If you are premenopausal and you may become pregnant, ask your health care provider about preconception counseling.  If you may become pregnant, take 400 to 800 micrograms (mcg)  of folic acid every day.  If you want to prevent pregnancy, talk to your health care provider about birth control (contraception). OSTEOPOROSIS AND MENOPAUSE   Osteoporosis is a disease in which the bones lose minerals and strength with aging. This can result in serious bone fractures. Your risk for osteoporosis can be identified using a bone density scan.  If you are 68 years of age or older, or if you are at risk for osteoporosis and fractures, ask your health care provider if you should be screened.  Ask your health care provider whether you should take a calcium or vitamin D supplement to lower your risk for osteoporosis.  Menopause may have certain physical symptoms and risks.  Hormone replacement therapy may reduce some of these symptoms and risks. Talk to  your health care provider about whether hormone replacement therapy is right for you.  HOME CARE INSTRUCTIONS   Schedule regular health, dental, and eye exams.  Stay current with your immunizations.   Do not use any tobacco products including cigarettes, chewing tobacco, or electronic cigarettes.  If you are pregnant, do not drink alcohol.  If you are breastfeeding, limit how much and how often you drink alcohol.  Limit alcohol intake to no more than 1 drink per day for nonpregnant women. One drink equals 12 ounces of beer, 5 ounces of wine, or 1 ounces of hard liquor.  Do not use street drugs.  Do not share needles.  Ask your health care provider for help if you need support or information about quitting drugs.  Tell your health care provider if you often feel depressed.  Tell your health care provider if you have ever been abused or do not feel safe at home.   This information is not intended to replace advice given to you by your health care provider. Make sure you discuss any questions you have with your health care provider.   Document Released: 10/02/2010 Document Revised: 04/09/2014 Document Reviewed:  02/18/2013 Elsevier Interactive Patient Education [employer].

## 2015-04-06 NOTE — Progress Notes (Signed)
Pre visit review using our clinic review tool, if applicable. No additional management support is needed unless otherwise documented below in the visit note. 

## 2015-04-06 NOTE — Progress Notes (Signed)
I have reviewed and agree with the plan. 

## 2015-04-13 ENCOUNTER — Ambulatory Visit: Payer: Medicare Other

## 2015-04-20 ENCOUNTER — Ambulatory Visit (INDEPENDENT_AMBULATORY_CARE_PROVIDER_SITE_OTHER): Payer: Medicare Other | Admitting: General Practice

## 2015-04-20 DIAGNOSIS — I4891 Unspecified atrial fibrillation: Secondary | ICD-10-CM | POA: Diagnosis not present

## 2015-04-20 DIAGNOSIS — Z5181 Encounter for therapeutic drug level monitoring: Secondary | ICD-10-CM

## 2015-04-20 LAB — POCT INR: INR: 0.9

## 2015-04-20 NOTE — Progress Notes (Signed)
Pre visit review using our clinic review tool, if applicable. No additional management support is needed unless otherwise documented below in the visit note.  INR is low today.  Patient is new to coumadin and I am trying to regulate her.  She is lower today than last week after increasing her coumadin by 20 percent.  Patient states that she is definitely taking the medication as prescribed and is following the directions that have been given.  I consulted Eino Farber, Pharmacist at the cardiology office and she recommended that the patient dosage be boosted today and tomorrow and then increased by an additional 20 percent.  Patient is taking carbamazapine which may be hindering our progress.  Instructed patient to stay away from foods high in vitamin K until her INR is therapeutic.    Patient verbalized understanding.  Re-check in 1 week.

## 2015-04-20 NOTE — Progress Notes (Signed)
I have reviewed and agree with the plan. 

## 2015-04-22 ENCOUNTER — Ambulatory Visit (INDEPENDENT_AMBULATORY_CARE_PROVIDER_SITE_OTHER): Payer: Medicare Other | Admitting: Cardiology

## 2015-04-22 ENCOUNTER — Other Ambulatory Visit (HOSPITAL_COMMUNITY): Payer: Medicare Other

## 2015-04-22 ENCOUNTER — Other Ambulatory Visit: Payer: Self-pay | Admitting: Internal Medicine

## 2015-04-22 ENCOUNTER — Other Ambulatory Visit: Payer: Self-pay

## 2015-04-22 ENCOUNTER — Encounter: Payer: Self-pay | Admitting: Cardiology

## 2015-04-22 ENCOUNTER — Ambulatory Visit (HOSPITAL_COMMUNITY): Payer: Medicare Other | Attending: Cardiovascular Disease

## 2015-04-22 VITALS — BP 148/90 | HR 76 | Ht 71.0 in | Wt 149.4 lb

## 2015-04-22 DIAGNOSIS — I517 Cardiomegaly: Secondary | ICD-10-CM | POA: Insufficient documentation

## 2015-04-22 DIAGNOSIS — E785 Hyperlipidemia, unspecified: Secondary | ICD-10-CM | POA: Insufficient documentation

## 2015-04-22 DIAGNOSIS — I4891 Unspecified atrial fibrillation: Secondary | ICD-10-CM | POA: Insufficient documentation

## 2015-04-22 DIAGNOSIS — I4819 Other persistent atrial fibrillation: Secondary | ICD-10-CM

## 2015-04-22 DIAGNOSIS — I10 Essential (primary) hypertension: Secondary | ICD-10-CM | POA: Diagnosis not present

## 2015-04-22 DIAGNOSIS — Z7901 Long term (current) use of anticoagulants: Secondary | ICD-10-CM | POA: Diagnosis not present

## 2015-04-22 DIAGNOSIS — I481 Persistent atrial fibrillation: Secondary | ICD-10-CM

## 2015-04-22 DIAGNOSIS — I4892 Unspecified atrial flutter: Secondary | ICD-10-CM

## 2015-04-22 MED ORDER — CARVEDILOL 6.25 MG PO TABS: 6.2500 mg | ORAL_TABLET | Freq: Two times a day (BID) | ORAL | Status: AC

## 2015-04-22 NOTE — Progress Notes (Signed)
Cardiology Office Note    Date:  04/22/2015   ID:  Tammy Duke, DOB 08/19/1940, MRN [id number]  PCP:  Cathlean Cower, MD  Cardiologist:   Candee Furbish, MD     History of Present Illness:  Tammy Duke is a 75 y.o. female here for evaluation of atrial fibrillation, on chronic anticoagulation.  This was discovered on 03/30/15 and carvedilol 3.125 twice a day was started, aspirin was stopped and Coumadin was begun. She has friends who were on Eliquis or Xarelto however she is very concerned about the cost of these medications as she is on a fixed income. When mentioned $40 potential co-pay, she said that would be too much. At one point her tizanidine when up in price in this worried her.  EKG on 03/30/15 showed atrial fibrillation heart rate of 95 bpm.  She very rarely feels any symptoms. Rare palpitations. No prior strokes.   No chest pain, no dyspnea. She utilizes a walker to protect herself during ambulation because she has had a prior compression fracture.  Echocardiogram appears with normal ejection fraction, mild mitral and tricuspid regurgitation, mildly dilated left atrium. Final read to come.  Past Medical History  Diagnosis Date  . GOITER, MULTINODULAR 05/04/2009  . HYPOTHYROIDISM 04/21/2009  . HYPERTENSION 04/21/2009  . ALLERGIC RHINITIS 04/21/2009  . SYNCOPE 01/02/2010  . FATIGUE 04/21/2009  . THYROID CANCER, HX OF 04/21/2009  . Osteoporosis 10/28/2011  . Environmental allergies   . Degenerative arthritis of right knee 09/24/2012  . Colonic fistula   . Cancer ([place]) 1984    thyroid  . SEIZURE DISORDER     "under control"  . Headache(784.0)     hx of  . H/O: whooping cough 75 years old  . GERD (gastroesophageal reflux disease)     "tums controls it"  . Back spasm   . Personal history of arthritis   . Other malignant neoplasm without specification of site   . Menopausal syndrome     Past Surgical History  Procedure Laterality Date  . S/p left radius  fracture surgury  2001    Dr. Rhona Raider, Ortho.  . S/p thyroid surgury for cancer  1985    biopsy--PARTIAL THYROIDECTOMY  . Hx of right retenal detachment   Sept. 2005    partial  . S/p right cataract with lens implant    . Laparoscopic sigmoid colectomy  01/30/2012    Procedure: LAPAROSCOPIC SIGMOID COLECTOMY;  Surgeon: Pedro Earls, MD;  Location: [facility];  Service: General;  Laterality: N/A;  Lap Assisted Sigmoid Colectomy with Takedown of Colovesicle Fistula  . Take down of intestinal fistula  01/30/2012    Procedure: TAKE DOWN OF INTESTINAL FISTULA;  Surgeon: Pedro Earls, MD;  Location: [facility];  Service: General;  Laterality: N/A;  . Vesico-vaginal fistula repair  01/30/2012    Procedure: FISTULA REPAIR VESICO-VAGINAL;  Surgeon: Reece Packer, MD;  Location: [facility];  Service: Urology;  Laterality: N/A;  Enterovesical Fistula Repair  . Colon surgery    . Total knee arthroplasty Right 02/10/2013    Procedure: TOTAL KNEE ARTHROPLASTY;  Surgeon: Hessie Dibble, MD;  Location: [place];  Service: Orthopedics;  Laterality: Right;  . Fracture surgery      left arm  . Colon resection N/A 07/28/2013    Procedure: LAPAROSCOPIC  SPLENIC FLEXURE mobilizationTAKEDOWN inner enterCOLOVESICLE FISTULAr  , lysis of adhesions.hartman procedure, partial salpingoectomey,   cystoscopy ;  Surgeon: Pedro Earls, MD;  Location: [facility];  Service: General;  Laterality: N/A;  . Vesico-vaginal fistula repair Left 07/28/2013    Procedure: cystoscopy;  Surgeon: Reece Packer, MD;  Location: [facility];  Service: Urology;  Laterality: Left;  . Colostomy takedown N/A 12/04/2013    Procedure: COLOSTOMY TAKEDOWN (HARTMAN)/takedown of Hartman pouch and end colostomy;  Surgeon: Kaylyn Lim, MD;  Location: [facility];  Service: General;  Laterality: N/A;    Outpatient Prescriptions Prior to Visit  Medication Sig Dispense Refill  . acetaminophen (TYLENOL) 500 MG tablet Take 1,000 mg by mouth every 6 (six) hours as  needed for mild pain or moderate pain.    Marland Kitchen alendronate (FOSAMAX) 70 MG tablet Take 1 tablet (70 mg total) by mouth every 7 (seven) days. Take with a full glass of water on an empty stomach. 12 tablet 3  . Calcium Carbonate-Vitamin D (CALTRATE 600+D) 600-400 MG-UNIT per tablet Take 1 tablet by mouth 2 (two) times daily.      . carbamazepine (TEGRETOL) 200 MG tablet TAKE 1 AND 1/2 TABLETS BY MOUTH TWICE DAILY AND 1 TABLET AT LUNCH AND SUPPER AS DIRECTED 450 tablet 1  . fluticasone (FLONASE) 50 MCG/ACT nasal spray Place 2 sprays into both nostrils daily. 16 g 2  . levothyroxine (SYNTHROID, LEVOTHROID) 100 MCG tablet Take 100 mcg by mouth daily before breakfast.    . losartan (COZAAR) 50 MG tablet Take 1 tablet (50 mg total) by mouth daily. 90 tablet 1  . Magnesium 250 MG TABS Take 1 tablet by mouth daily.     . Probiotic Product (PROBIOTIC PO) Take by mouth daily. OTC    . tiZANidine (ZANAFLEX) 4 MG tablet TAKE 1 TABLET BY MOUTH EVERY 6 HOURS AS NEEDED FOR MUSCLE SPASMS 375 tablet 0  . warfarin (COUMADIN) 5 MG tablet TAKE 1 TABLET BY MOUTH EVERY DAY OR AS DIRECTED BY ANTICOAGULATION CLINIC 105 tablet 0  . carvedilol (COREG) 3.125 MG tablet Take 1 tablet (3.125 mg total) by mouth 2 (two) times daily with a meal. 60 tablet 11  . tiZANidine (ZANAFLEX) 4 MG tablet TAKE 1 TABLET BY MOUTH EVERY 6 HOURS AS NEEDED FOR MUSCLE SPASMS 375 tablet 0  . Multiple Vitamin (MULTIVITAMIN WITH MINERALS) TABS tablet Take 1 tablet by mouth daily. Reported on 04/06/2015     No facility-administered medications prior to visit.     Allergies:   Sulfonamide derivatives; Erythromycin; and Latex   Social History   Social History  . Marital Status: Widowed    Spouse Name: N/A  . Number of Children: N/A  . Years of Education: N/A   Occupational History  . retired [employer] History Main Topics  . Smoking status: Former Smoker -- 10 years    Types: Cigarettes    Quit date: 04/02/1982  . Smokeless  tobacco: Never Used     Comment: 1 pack would last 2 weeks  . Alcohol Use: No  . Drug Use: No  . Sexual Activity: No   Other Topics Concern  . None   Social History Narrative   Lives alone in apt.     Family History:  The patient's family history includes Bipolar disorder in her brother and sister; Goiter in her mother; Heart attack in her brother; Heart disease in her father and mother; Seizures in her other; Stroke in her father.   ROS:   Please see the history of present illness.    ROS All other systems reviewed and are negative.   PHYSICAL EXAM:   VS:  BP 148/90 mmHg  Pulse 76  Ht 5\' 11"  (1.803 m)  Wt 149 lb 6.4 oz (67.767 kg)  BMI 20.85 kg/m2  SpO2 94%   GEN: Well nourished, well developed, in no acute distress HEENT: normal Neck: no JVD, carotid bruits, or masses Cardiac: Irregularly irregular, mildly tachycardic at times, soft systolic murmur, rubs, or gallops,no edema  Respiratory:  clear to auscultation bilaterally, normal work of breathing GI: soft, nontender, nondistended, + BS MS: no deformity or atrophy Skin: warm and dry, no rash Neuro:  Alert and Oriented x 3, Strength and sensation are intact Psych: euthymic mood, full affect  Wt Readings from Last 3 Encounters:  04/22/15 149 lb 6.4 oz (67.767 kg)  04/06/15 149 lb 8 oz (67.813 kg)  03/30/15 147 lb (66.679 kg)      Studies/Labs Reviewed:   EKG:  03/30/15-personally viewed-atrial fibrillation 95 bpm no other significant abnormalities.  Recent Labs: 09/23/2014: ALT 24; TSH 1.54 12/21/2014: BUN 15; Creatinine, Ser 0.73; Hemoglobin 15.3*; Platelets 301.0; Potassium 4.0; Sodium 125*   Lipid Panel    Component Value Date/Time   CHOL 196 09/23/2014 0833   TRIG 65.0 09/23/2014 0833   HDL 74.80 09/23/2014 0833   CHOLHDL 3 09/23/2014 0833   VLDL 13.0 09/23/2014 0833   LDLCALC 108* 09/23/2014 0833    Additional studies/ records that were reviewed today include:  Office notes, labs,  EKGs    ASSESSMENT:    1. Persistent atrial fibrillation ([place])   2. Chronic anticoagulation   3. Essential hypertension      PLAN:  In order of problems listed above:  1. Newly discovered atrial fibrillation 03/30/2015. Overall reasonable rate control, however when standing, heart rate increased to tachycardic levels. We will increase her carvedilol to 6.25 mg twice a day.. Anticoagulated with Coumadin. Ejection fraction appears normal. Mildly dilated left atrium noted. Mild mitral regurgitation. Because this is newly discovered, I would like to give her an opportunity at normal rhythm. In order to do this, we will need 3 successive weeks of therapeutic INRs demonstrated before proceeding with outpatient elective cardioversion with the help of anesthesia. Risks and benefits of this procedure were discussed. This will need to be coordinated with Dr. Gwynn Burly office. 2. Anticoagulation-choices were discussed, novel oral anticoagulation discussed. She is concerned about cost. The advantages would be no need for INR checks, confidence that she is fully therapeutic. 3. Blood pressure under good control. Continue ambulating with walker especially with anticoagulation for safety. 4. We will see her back after cardioversion.    Medication Adjustments/Labs and Tests Ordered: Current medicines are reviewed at length with the patient today.  Concerns regarding medicines are outlined above.  Medication changes, Labs and Tests ordered today are listed in the Patient Instructions below. Patient Instructions  Medication Instructions:  Please increase your Carvedilol to 6.25 mg twice a day. Continue all other medications as listed.  Labwork: Please have PT/INR every week.  Once you have had therapeutic INRs for 3 weeks in a row you will be scheduled for a cardioversion.  Testing/Procedures: Your physician has requested that you have a Cardioversion.  Electrical Cardioversion uses a jolt of electricity  to your heart either through paddles or wired patches attached to your chest. This is a controlled, usually prescheduled, procedure. This procedure is done at the hospital and you are not awake during the procedure. You usually go home the day of the procedure. Please see the instruction sheet given to you today for more information.  Follow-Up: Follow  up after your cardioversion with Dr Marlou Porch or a PA/NP.  If you need a refill on your cardiac medications before your next appointment, please call your pharmacy.  Thank you for choosing [facility]!!             Signed, Candee Furbish, MD  04/22/2015 9:53 AM    [facility] [address], [place], [place]  [address] Phone: [phone]; Fax: [phone]

## 2015-04-22 NOTE — Patient Instructions (Addendum)
Medication Instructions:  Please increase your Carvedilol to 6.25 mg twice a day. Continue all other medications as listed.  Labwork: Please have PT/INR every week.  Once you have had therapeutic INRs for 3 weeks in a row you will be scheduled for a cardioversion.  Testing/Procedures: Your physician has requested that you have a Cardioversion.  Electrical Cardioversion uses a jolt of electricity to your heart either through paddles or wired patches attached to your chest. This is a controlled, usually prescheduled, procedure. This procedure is done at the hospital and you are not awake during the procedure. You usually go home the day of the procedure. Please see the instruction sheet given to you today for more information.  Follow-Up: Follow up after your cardioversion with Dr Marlou Porch or a PA/NP.  If you need a refill on your cardiac medications before your next appointment, please call your pharmacy.  Thank you for choosing [facility]!!

## 2015-04-23 ENCOUNTER — Encounter: Payer: Self-pay | Admitting: Internal Medicine

## 2015-04-27 ENCOUNTER — Ambulatory Visit (INDEPENDENT_AMBULATORY_CARE_PROVIDER_SITE_OTHER): Payer: Medicare Other | Admitting: General Practice

## 2015-04-27 DIAGNOSIS — Z5181 Encounter for therapeutic drug level monitoring: Secondary | ICD-10-CM | POA: Diagnosis not present

## 2015-04-27 DIAGNOSIS — I4891 Unspecified atrial fibrillation: Secondary | ICD-10-CM

## 2015-04-27 LAB — POCT INR: INR: 1

## 2015-04-27 NOTE — Progress Notes (Signed)
Pre visit review using our clinic review tool, if applicable. No additional management support is needed unless otherwise documented below in the visit note. INR is low today.  This is a new patient and this is her fourth visit.  Patient is taking carbamezipine and it is taking a while to get her to a therapeutic INR.  I have been consulting pharmacy at the [facility].  I spoke with Jinny Blossom today and patient's dosage was increased 20 percent.  Re-check in 1 week.  Patient verbalized increase in dosage and a calendar was given to patient with dosing instructions.

## 2015-05-01 ENCOUNTER — Other Ambulatory Visit: Payer: Self-pay | Admitting: Internal Medicine

## 2015-05-04 ENCOUNTER — Ambulatory Visit (INDEPENDENT_AMBULATORY_CARE_PROVIDER_SITE_OTHER): Payer: Medicare Other | Admitting: General Practice

## 2015-05-04 DIAGNOSIS — I4891 Unspecified atrial fibrillation: Secondary | ICD-10-CM

## 2015-05-04 DIAGNOSIS — Z5181 Encounter for therapeutic drug level monitoring: Secondary | ICD-10-CM

## 2015-05-04 LAB — POCT INR: INR: 1.5

## 2015-05-04 NOTE — Progress Notes (Signed)
I have reviewed and agree with the plan. 

## 2015-05-04 NOTE — Progress Notes (Signed)
Pre visit review using our clinic review tool, if applicable. No additional management support is needed unless otherwise documented below in the visit note. INR is low today.  Patient is new to coumadin.  Patient takes carbamezipine and this works agains the medication.  Dosage is being increased slowly to get patient therapeutic.  Increased patient today by 15 percent.  This is the first week that the INR has moved.  Re-check in 1 week.

## 2015-05-05 ENCOUNTER — Other Ambulatory Visit: Payer: Self-pay | Admitting: Internal Medicine

## 2015-05-11 ENCOUNTER — Ambulatory Visit (INDEPENDENT_AMBULATORY_CARE_PROVIDER_SITE_OTHER): Payer: Medicare Other | Admitting: General Practice

## 2015-05-11 DIAGNOSIS — Z5181 Encounter for therapeutic drug level monitoring: Secondary | ICD-10-CM

## 2015-05-11 DIAGNOSIS — I4891 Unspecified atrial fibrillation: Secondary | ICD-10-CM | POA: Diagnosis not present

## 2015-05-11 LAB — POCT INR: INR: 2

## 2015-05-11 NOTE — Progress Notes (Signed)
Pre visit review using our clinic review tool, if applicable. No additional management support is needed unless otherwise documented below in the visit note. 

## 2015-05-11 NOTE — Progress Notes (Signed)
I have reviewed and agree with the plan. 

## 2015-05-16 ENCOUNTER — Other Ambulatory Visit: Payer: Self-pay | Admitting: Internal Medicine

## 2015-05-18 ENCOUNTER — Ambulatory Visit (INDEPENDENT_AMBULATORY_CARE_PROVIDER_SITE_OTHER): Payer: Medicare Other | Admitting: General Practice

## 2015-05-18 DIAGNOSIS — I4891 Unspecified atrial fibrillation: Secondary | ICD-10-CM | POA: Diagnosis not present

## 2015-05-18 DIAGNOSIS — Z5181 Encounter for therapeutic drug level monitoring: Secondary | ICD-10-CM | POA: Diagnosis not present

## 2015-05-18 LAB — POCT INR: INR: 1.8

## 2015-05-18 NOTE — Progress Notes (Signed)
I have reviewed and agree with the plan. 

## 2015-05-18 NOTE — Progress Notes (Signed)
Pre visit review using our clinic review tool, if applicable. No additional management support is needed unless otherwise documented below in the visit note. 

## 2015-05-20 ENCOUNTER — Other Ambulatory Visit: Payer: Self-pay | Admitting: Internal Medicine

## 2015-05-25 ENCOUNTER — Ambulatory Visit: Payer: Medicare Other

## 2015-05-27 ENCOUNTER — Ambulatory Visit (INDEPENDENT_AMBULATORY_CARE_PROVIDER_SITE_OTHER): Payer: Medicare Other | Admitting: General Practice

## 2015-05-27 ENCOUNTER — Telehealth: Payer: Self-pay | Admitting: Emergency Medicine

## 2015-05-27 DIAGNOSIS — Z5181 Encounter for therapeutic drug level monitoring: Secondary | ICD-10-CM

## 2015-05-30 ENCOUNTER — Telehealth: Payer: Self-pay

## 2015-05-30 NOTE — Telephone Encounter (Signed)
On 05/30/2015 I received a death certificate from [facility] (faxed). The patient is a patient of Cathlean Cower. The death certificate is for cremation. The death certificate will be taken to [facility] this pm for signature. On 06/26/2015 I received the death certificate back from [place] who signed it for Jenny Reichmann since he was on vacation this week. I got the death certificate ready and faxed them a copy per their request.

## 2015-06-01 ENCOUNTER — Telehealth: Payer: Self-pay

## 2015-06-01 ENCOUNTER — Ambulatory Visit: Payer: Medicare Other

## 2015-06-01 NOTE — Telephone Encounter (Signed)
On 06/01/2015 I received a death certificate from [facility] (original). The death certificate is for cremation. The patient is a patient of Cathlean Cower. The death certificate will be taken to [facility] this pm for signature. On 06-23-15 I received the death certificate back from [place] who signed it for Doctor Jenny Reichmann since he was on vacation this week. I got the death certificate ready and called the funeral home to let them know the d/c was being mailed to the [facility] and faxed them a copy per their request.

## 2015-06-01 NOTE — Progress Notes (Signed)
I have reviewed and agree with the plan. 

## 2015-06-01 NOTE — Telephone Encounter (Signed)
EMS called to clarify that MD would sign death certificate for pt. They were currently on scene.

## 2015-06-01 DEATH — deceased

## 2015-06-15 ENCOUNTER — Ambulatory Visit (INDEPENDENT_AMBULATORY_CARE_PROVIDER_SITE_OTHER): Payer: Medicare Other | Admitting: Ophthalmology

## 2016-02-13 IMAGING — CR DG BE LIMITED  W/ CM
1 series · 1 of 1 positions shown · IV contrast (omnipaque)
Comparison: September 08, 2013 contrast enema via left lower quadrant
ostomy

CLINICAL DATA: Recent partial colectomy with ostomy creation

EXAM:
BE LIMITED WITH WATER-SOLUBLE CONTRAST
TECHNIQUE: The rectum and remaining sigmoid colon were studied in a retrograde
manner using water-soluble single contrast technique.
CONTRAST:  Omnipaque 300 water-soluble
FLUOROSCOPY TIME:  4 min 53 seconds

[view not recorded]
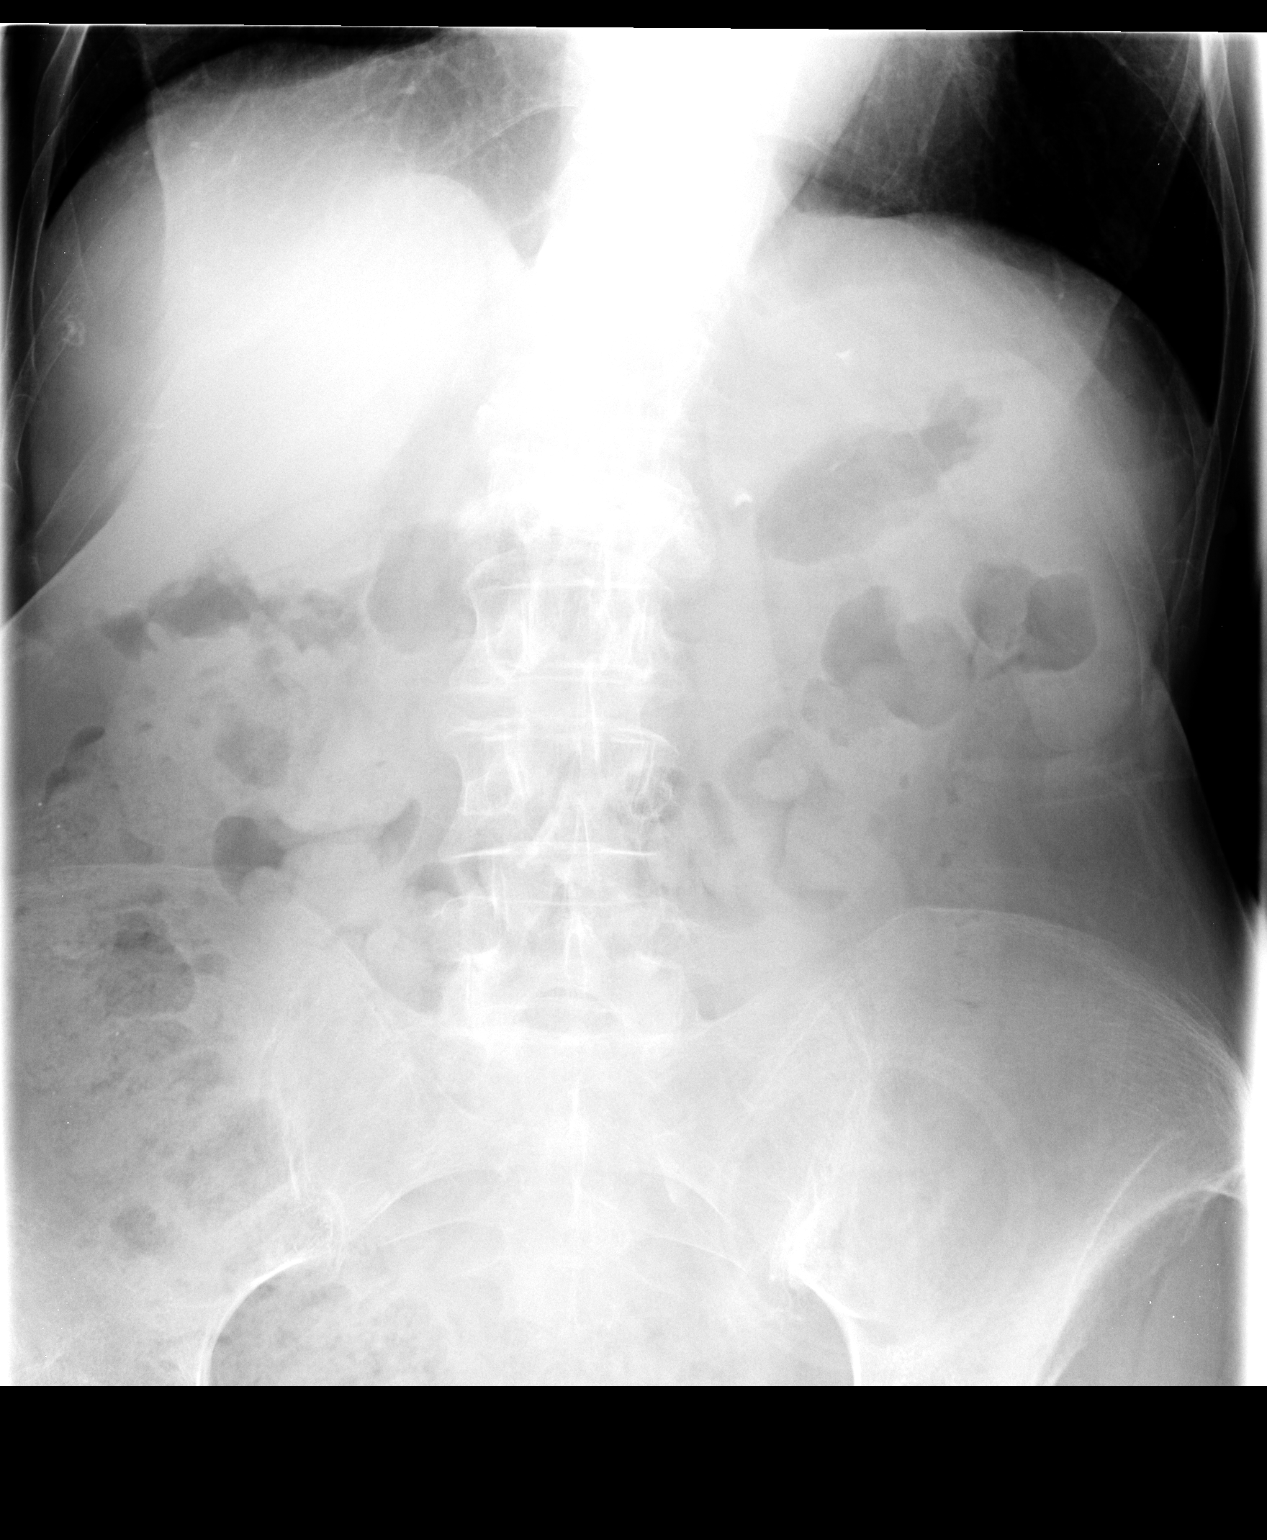

[1 of 1 positions shown; findings below may reference images not displayed]

FINDINGS: There are diverticula in the remaining sigmoid colon. There is no
evidence of contrast leak. There is no mucosal lesion, fistula, or
ulceration in the remaining sigmoid and rectum. No polyp or mass
lesion is identified in this region.
IMPRESSION: There is diverticulosis in the remaining sigmoid colon. No fistula
or contrast leak. No constricting lesion or mass.
